# Patient Record
Sex: Female | Born: 1945 | Race: White | Hispanic: No | Smoking: Never smoker
Health system: Southern US, Community
[De-identification: ages and names within clinical notes are randomized; demographics above are authoritative.]

## PROBLEM LIST (undated history)

## (undated) DIAGNOSIS — R0600 Dyspnea, unspecified: Secondary | ICD-10-CM

## (undated) DIAGNOSIS — I10 Essential (primary) hypertension: Secondary | ICD-10-CM

## (undated) DIAGNOSIS — E785 Hyperlipidemia, unspecified: Secondary | ICD-10-CM

## (undated) DIAGNOSIS — M7989 Other specified soft tissue disorders: Secondary | ICD-10-CM

## (undated) DIAGNOSIS — G459 Transient cerebral ischemic attack, unspecified: Secondary | ICD-10-CM

## (undated) DIAGNOSIS — M1990 Unspecified osteoarthritis, unspecified site: Secondary | ICD-10-CM

## (undated) HISTORY — DX: Unspecified osteoarthritis, unspecified site: M19.90

## (undated) HISTORY — PX: TOTAL HIP ARTHROPLASTY: SHX124

## (undated) HISTORY — PX: TONSILLECTOMY: SUR1361

## (undated) HISTORY — DX: Essential (primary) hypertension: I10

## (undated) HISTORY — DX: Dyspnea, unspecified: R06.00

## (undated) HISTORY — DX: Transient cerebral ischemic attack, unspecified: G45.9

## (undated) HISTORY — DX: Hyperlipidemia, unspecified: E78.5

## (undated) HISTORY — DX: Other specified soft tissue disorders: M79.89

---

## 1999-08-09 ENCOUNTER — Encounter: Payer: Self-pay | Admitting: Otolaryngology

## 1999-08-09 ENCOUNTER — Encounter: Admission: RE | Admit: 1999-08-09 | Discharge: 1999-08-09 | Payer: Self-pay | Admitting: Otolaryngology

## 1999-11-26 ENCOUNTER — Other Ambulatory Visit: Admission: RE | Admit: 1999-11-26 | Discharge: 1999-11-26 | Payer: Self-pay | Admitting: Gynecology

## 2001-04-01 ENCOUNTER — Other Ambulatory Visit: Admission: RE | Admit: 2001-04-01 | Discharge: 2001-04-01 | Payer: Self-pay | Admitting: Gynecology

## 2002-11-28 ENCOUNTER — Other Ambulatory Visit: Admission: RE | Admit: 2002-11-28 | Discharge: 2002-11-28 | Payer: Self-pay | Admitting: Gynecology

## 2002-12-12 ENCOUNTER — Ambulatory Visit (HOSPITAL_COMMUNITY): Admission: RE | Admit: 2002-12-12 | Discharge: 2002-12-12 | Payer: Self-pay | Admitting: Gynecology

## 2002-12-12 ENCOUNTER — Encounter (INDEPENDENT_AMBULATORY_CARE_PROVIDER_SITE_OTHER): Payer: Self-pay | Admitting: *Deleted

## 2004-10-03 ENCOUNTER — Other Ambulatory Visit: Admission: RE | Admit: 2004-10-03 | Discharge: 2004-10-03 | Payer: Self-pay | Admitting: Gynecology

## 2006-12-15 ENCOUNTER — Emergency Department (HOSPITAL_COMMUNITY): Admission: EM | Admit: 2006-12-15 | Discharge: 2006-12-15 | Payer: Self-pay | Admitting: *Deleted

## 2008-02-10 ENCOUNTER — Encounter: Admission: RE | Admit: 2008-02-10 | Discharge: 2008-02-10 | Payer: Self-pay | Admitting: Rheumatology

## 2008-02-22 ENCOUNTER — Encounter: Admission: RE | Admit: 2008-02-22 | Discharge: 2008-02-22 | Payer: Self-pay | Admitting: Gynecology

## 2008-02-24 ENCOUNTER — Encounter: Admission: RE | Admit: 2008-02-24 | Discharge: 2008-02-24 | Payer: Self-pay | Admitting: Rheumatology

## 2008-03-01 ENCOUNTER — Encounter: Admission: RE | Admit: 2008-03-01 | Discharge: 2008-03-01 | Payer: Self-pay | Admitting: Rheumatology

## 2008-09-04 ENCOUNTER — Ambulatory Visit: Payer: Self-pay | Admitting: Internal Medicine

## 2008-09-11 ENCOUNTER — Encounter: Payer: Self-pay | Admitting: Internal Medicine

## 2008-11-23 ENCOUNTER — Ambulatory Visit (HOSPITAL_COMMUNITY): Admission: RE | Admit: 2008-11-23 | Discharge: 2008-11-23 | Payer: Self-pay | Admitting: Rheumatology

## 2010-03-10 ENCOUNTER — Encounter: Payer: Self-pay | Admitting: Gynecology

## 2010-07-05 NOTE — Op Note (Signed)
   NAME:  Savannah [name], PLATTE                             ACCOUNT NO.:  [id number]   MEDICAL RECORD NO.:  [id number]                   PATIENT TYPE:  AMB   LOCATION:  SDC                                  FACILITY:  [facility]   PHYSICIAN:  Luvenia Redden, M.D.                DATE OF BIRTH:  February 09, 1946   DATE OF PROCEDURE:  12/12/2002  DATE OF DISCHARGE:                                 OPERATIVE REPORT   PREOPERATIVE DIAGNOSIS:  Postmenopausal bleeding.   POSTOPERATIVE DIAGNOSIS:  Postmenopausal bleeding.   OPERATION:  Hysteroscopy and D&C.   SURGEON:  Luvenia Redden, M.D.   DESCRIPTION OF PROCEDURE:  Under good anesthesia, the patient was prepped  and draped in sterile manner.  Bladder was catheterized.  The uterus was  upper limits of normal size and retroflexed slightly.  Cervix was grasped  with tenaculum, sounded to a depth of 4 inches.  The hysteroscope was placed  into the endocervical canal using Sorbitol as a distending medium.  Endocervical canal was viewed and appeared to be normal, entering the  endometrial cavity.  There were several small polypoid structures seen.  There was a whitish structure on the anterior wall of the uterus that  appeared to be a submucous fibroid.  Dara Lords were viewed and appeared normal.  The scope was retracted and endocervical curettage was done and this was  sent as a separate specimen.  Endometrial cavity was then curetted  thoroughly and a small amount of tissue was obtained.  The endometrial  cavity was wiped with a dry sponge and then reinspected.  The endometrial  cavity's wall was kind of shaggy like it looks after a curettage but there  were no masses seen.  The remaining appeared clean.  The procedure was  terminated.  Fluid deficit was 30 mL.  The patient tolerated the procedure  well. She was removed to recovery in good condition.  Blood loss was 25 mL.                                               Luvenia Redden, M.D.    WSB/MEDQ  D:   12/12/2002  T:  12/12/2002  Job:  [id number]

## 2010-11-27 LAB — DIFFERENTIAL
Eosinophils Absolute: 0.1
Eosinophils Relative: 2
Lymphs Abs: 2
Monocytes Relative: 6

## 2010-11-27 LAB — I-STAT 8, (EC8 V) (CONVERTED LAB)
Acid-Base Excess: 2
Chloride: 106
Glucose, Bld: 92
Hemoglobin: 13.9
Potassium: 3.9
Sodium: 139
pH, Ven: 7.394 — ABNORMAL HIGH

## 2010-11-27 LAB — CBC
HCT: 39.8
Hemoglobin: 13.8
MCV: 90.3
RBC: 4.4
WBC: 6

## 2011-02-19 ENCOUNTER — Ambulatory Visit (INDEPENDENT_AMBULATORY_CARE_PROVIDER_SITE_OTHER): Payer: Medicare Other

## 2011-02-19 DIAGNOSIS — R05 Cough: Secondary | ICD-10-CM | POA: Diagnosis not present

## 2011-02-19 DIAGNOSIS — R059 Cough, unspecified: Secondary | ICD-10-CM

## 2011-02-19 DIAGNOSIS — R509 Fever, unspecified: Secondary | ICD-10-CM | POA: Diagnosis not present

## 2011-02-19 DIAGNOSIS — J111 Influenza due to unidentified influenza virus with other respiratory manifestations: Secondary | ICD-10-CM | POA: Diagnosis not present

## 2011-02-19 DIAGNOSIS — M349 Systemic sclerosis, unspecified: Secondary | ICD-10-CM

## 2011-07-10 DIAGNOSIS — M76899 Other specified enthesopathies of unspecified lower limb, excluding foot: Secondary | ICD-10-CM | POA: Diagnosis not present

## 2011-07-10 DIAGNOSIS — M359 Systemic involvement of connective tissue, unspecified: Secondary | ICD-10-CM | POA: Diagnosis not present

## 2011-07-10 DIAGNOSIS — M949 Disorder of cartilage, unspecified: Secondary | ICD-10-CM | POA: Diagnosis not present

## 2011-07-10 DIAGNOSIS — M899 Disorder of bone, unspecified: Secondary | ICD-10-CM | POA: Diagnosis not present

## 2011-07-10 DIAGNOSIS — E559 Vitamin D deficiency, unspecified: Secondary | ICD-10-CM | POA: Diagnosis not present

## 2011-07-29 DIAGNOSIS — E782 Mixed hyperlipidemia: Secondary | ICD-10-CM | POA: Diagnosis not present

## 2011-07-29 DIAGNOSIS — I10 Essential (primary) hypertension: Secondary | ICD-10-CM | POA: Diagnosis not present

## 2011-08-12 DIAGNOSIS — E782 Mixed hyperlipidemia: Secondary | ICD-10-CM | POA: Diagnosis not present

## 2011-08-19 DIAGNOSIS — M7989 Other specified soft tissue disorders: Secondary | ICD-10-CM | POA: Diagnosis not present

## 2011-09-29 DIAGNOSIS — Z79899 Other long term (current) drug therapy: Secondary | ICD-10-CM | POA: Diagnosis not present

## 2011-09-29 DIAGNOSIS — E782 Mixed hyperlipidemia: Secondary | ICD-10-CM | POA: Diagnosis not present

## 2011-10-07 ENCOUNTER — Encounter: Payer: Self-pay | Admitting: Physician Assistant

## 2011-10-07 DIAGNOSIS — E785 Hyperlipidemia, unspecified: Secondary | ICD-10-CM | POA: Insufficient documentation

## 2011-10-07 DIAGNOSIS — I10 Essential (primary) hypertension: Secondary | ICD-10-CM | POA: Insufficient documentation

## 2011-10-07 DIAGNOSIS — M349 Systemic sclerosis, unspecified: Secondary | ICD-10-CM | POA: Insufficient documentation

## 2011-10-07 DIAGNOSIS — I8390 Asymptomatic varicose veins of unspecified lower extremity: Secondary | ICD-10-CM

## 2011-10-29 ENCOUNTER — Encounter: Payer: Self-pay | Admitting: Family Medicine

## 2011-11-05 DIAGNOSIS — I10 Essential (primary) hypertension: Secondary | ICD-10-CM | POA: Diagnosis not present

## 2011-11-05 DIAGNOSIS — E782 Mixed hyperlipidemia: Secondary | ICD-10-CM | POA: Diagnosis not present

## 2011-11-12 DIAGNOSIS — M25559 Pain in unspecified hip: Secondary | ICD-10-CM | POA: Diagnosis not present

## 2012-04-30 DIAGNOSIS — Z6829 Body mass index (BMI) 29.0-29.9, adult: Secondary | ICD-10-CM | POA: Diagnosis not present

## 2012-04-30 DIAGNOSIS — H6120 Impacted cerumen, unspecified ear: Secondary | ICD-10-CM | POA: Diagnosis not present

## 2012-04-30 DIAGNOSIS — J329 Chronic sinusitis, unspecified: Secondary | ICD-10-CM | POA: Diagnosis not present

## 2012-05-06 DIAGNOSIS — H9190 Unspecified hearing loss, unspecified ear: Secondary | ICD-10-CM | POA: Diagnosis not present

## 2012-05-06 DIAGNOSIS — H6120 Impacted cerumen, unspecified ear: Secondary | ICD-10-CM | POA: Diagnosis not present

## 2012-05-06 DIAGNOSIS — Z6829 Body mass index (BMI) 29.0-29.9, adult: Secondary | ICD-10-CM | POA: Diagnosis not present

## 2012-06-24 DIAGNOSIS — M2550 Pain in unspecified joint: Secondary | ICD-10-CM | POA: Diagnosis not present

## 2012-06-24 DIAGNOSIS — Z79899 Other long term (current) drug therapy: Secondary | ICD-10-CM | POA: Diagnosis not present

## 2012-06-25 DIAGNOSIS — M2550 Pain in unspecified joint: Secondary | ICD-10-CM | POA: Diagnosis not present

## 2012-07-01 DIAGNOSIS — E559 Vitamin D deficiency, unspecified: Secondary | ICD-10-CM | POA: Diagnosis not present

## 2012-07-01 DIAGNOSIS — M2550 Pain in unspecified joint: Secondary | ICD-10-CM | POA: Diagnosis not present

## 2012-07-16 ENCOUNTER — Encounter: Payer: Self-pay | Admitting: Internal Medicine

## 2012-07-16 ENCOUNTER — Ambulatory Visit (INDEPENDENT_AMBULATORY_CARE_PROVIDER_SITE_OTHER): Payer: Medicare Other | Admitting: Internal Medicine

## 2012-07-16 VITALS — BP 122/86 | HR 76 | Ht 67.0 in | Wt 189.0 lb

## 2012-07-16 DIAGNOSIS — Z79899 Other long term (current) drug therapy: Secondary | ICD-10-CM

## 2012-07-16 DIAGNOSIS — I8390 Asymptomatic varicose veins of unspecified lower extremity: Secondary | ICD-10-CM | POA: Diagnosis not present

## 2012-07-16 DIAGNOSIS — E782 Mixed hyperlipidemia: Secondary | ICD-10-CM | POA: Diagnosis not present

## 2012-07-16 DIAGNOSIS — I10 Essential (primary) hypertension: Secondary | ICD-10-CM

## 2012-07-16 DIAGNOSIS — E785 Hyperlipidemia, unspecified: Secondary | ICD-10-CM

## 2012-07-16 DIAGNOSIS — M349 Systemic sclerosis, unspecified: Secondary | ICD-10-CM | POA: Diagnosis not present

## 2012-07-16 NOTE — Patient Instructions (Addendum)
Your physician recommends that you schedule a follow-up appointment in: 1 year Your physician recommends that you return for lab work in: CMP and LIPIDS

## 2012-07-16 NOTE — Progress Notes (Signed)
OFFICE NOTE  Chief Complaint:  Routine followup  Primary Care Physician: Dois Davenport., MD  HPI:  Savannah [name]  is a 67 year old female with a history of scleroderma, hypertension, dyslipidemia, as well as lower extremity varicose veins. At her last visit, she underwent Doppler ultrasounds, which demonstrated both deep and superficial venous reflux. There was deep valvular insufficiency in the right femoral and popliteal veins as well as the left common femoral vein. The right and left greater saphenous veins also were dilated and demonstrated valvular insufficiency. The right short saphenous vein was also noted to be a small vessel with continuous reflux. The right short saphenous vein measured less than 3.3 cm; therefore, would be difficult to treat percutaneously. The left greater saphenous vein measured between 5.4 and 9.7 mm. The right measured between 2.4 and 6.4 cm. I discussed the findings with the patient today and feel that, although feasibly could close her greater saphenous veins, with her deep vein reflux, she may not have marked improvement in her symptoms. Now actually, her symptoms actually have improved somewhat with discontinuing Lipitor, which I had started recently for dyslipidemia. As you may recall, her LDL cholesterol was high at 139 with particle number of 1941. She started Lipitor, which brought her LDL down to 62 and a particle number of 1164. I was pleased with the result; however, she thinks that she has now developed muscle pains secondary to the Lipitor. Previously, she had been intolerant to Zocor and Zetia. Therefore, recommend that she try to reduce her Lipitor to 20 mg to see if that is more tolerable for her. Certainly we have room if we can keep her LDL cholesterol less than 295.  Recently she started taking her Lipitor every other day, again due to muscle pain. Otherwise she denies any new symptoms.  PMHx:  Past Medical History  Diagnosis Date  . Hypertension       2D ECHO, 12/24/2006 - EF 45-55%, normal  . Hyperlipidemia   . Swelling of extremity     LEA VENOUS DUPLEX, 08/19/2011 - deep valvular insufficiency noted with right femoral and popliteal veins and left common femoral vein  . TIA (transient ischemic attack)     CAROTID DOPPLER, 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  . Dyspnea     MYOVIEW, 12/29/2006 - post-stress EF 81%,no ECG changes, EKG negative for ischemia    History reviewed. No pertinent past surgical history.  FAMHx:  Family History  Problem Relation Age of Onset  . Atrial fibrillation Mother 70  . Hypertension Mother   . Hyperlipidemia Father   . Cirrhosis Father 76  . Kidney failure Father 44  . Stroke Maternal Grandmother 41  . Hypertension Maternal Grandmother 40  . Angina Maternal Grandfather 50  . Heart attack Maternal Grandfather 88  . Lung disease Paternal Grandmother     SOCHx:   reports that she has never smoked. She does not have any smokeless tobacco history on file. Her alcohol and drug histories are not on file.  ALLERGIES:  Allergies  Allergen Reactions  . Codeine Nausea Only  . Sulfa Antibiotics Hives  . Zetia (Ezetimibe) Other (See Comments)    Myalgia  . Zocor (Simvastatin) Other (See Comments)    Myalgia    ROS: A comprehensive review of systems was negative except for: Constitutional: positive for fatigue Musculoskeletal: positive for myalgias  HOME MEDS: Current Outpatient Prescriptions  Medication Sig Dispense Refill  . atorvastatin (LIPITOR) 20 MG tablet Take 20  mg by mouth daily.      . calcium gluconate 500 MG tablet Take 500 mg by mouth daily.      . Cholecalciferol (VITAMIN D3) 50000 UNITS CAPS Take 1 capsule by mouth once a week.      . Coenzyme Q10 (CO Q 10) 100 MG CAPS Take 1 capsule by mouth daily.      . diclofenac sodium (VOLTAREN) 1 % GEL Apply 2 g topically as needed.      . DiphenhydrAMINE HCl (BENADRYL ALLERGY PO) Take by mouth as needed.       . folic acid (FOLVITE) 1 MG tablet Take 2 mg by mouth daily.      Marland Kitchen lisinopril (PRINIVIL,ZESTRIL) 5 MG tablet Take 5 mg by mouth daily.      . Magnesium 500 MG CAPS Take 1 capsule by mouth daily.      . Manganese 50 MG TABS Take 1 tablet by mouth daily.      . Menaquinone-7 (VITAMIN K2) 100 MCG CAPS Take 1 capsule by mouth daily.      . Misc Natural Products (TART CHERRY ADVANCED PO) Take 1,000 mg by mouth daily.      Marland Kitchen omega-3 acid ethyl esters (LOVAZA) 1 G capsule Take 1 g by mouth every other day.      . predniSONE (DELTASONE) 5 MG tablet Take 5 mg by mouth as needed.      . thiamine (VITAMIN B-1) 100 MG tablet Take 100 mg by mouth daily.      . vitamin E 400 UNIT capsule Take 400 Units by mouth daily.      Marland Kitchen aspirin 81 MG tablet Take 81 mg by mouth.       No current facility-administered medications for this visit.    LABS/IMAGING: No results found for this or any previous visit (from the past 48 hour(s)). No results found.  VITALS: BP 122/86  Pulse 76  Ht 5\' 7"  (1.702 m)  Wt 189 lb (85.73 kg)  BMI 29.59 kg/m2  EXAM: General appearance: alert and no distress Neck: no adenopathy, no carotid bruit, no JVD, supple, symmetrical, trachea midline and thyroid not enlarged, symmetric, no tenderness/mass/nodules Lungs: clear to auscultation bilaterally Heart: regular rate and rhythm, S1, S2 normal, no murmur, click, rub or gallop Abdomen: soft, non-tender; bowel sounds normal; no masses,  no organomegaly Extremities: extremities normal, atraumatic, no cyanosis or edema Pulses: 2+ and symmetric Skin: Skin color, texture, turgor normal. No rashes or lesions Neurologic: Grossly normal  EKG: Normal sinus rhythm at 76  ASSESSMENT: 1. Scleroderma 2. Hypertension 3. Dyslipidemia 4. Varicose veins  PLAN: 1.   Savannah [name] is doing very well. She unfortunately has intolerance to Lipitor and taking it every other day in alternation with the Niaspan.  Her lipid profile was at goal  however a concern it may not be at goal anymore. We'll plan to go ahead and recheck a lipid profile. She does have upcoming followup with Dr. Erlene Senters - which is a good idea, since she's been having a dry cough which could be related to scleroderma. She thinks that she needs a higher dose of steroids.  We will plan followup in one year or sooner as necessary.  Chrystie Nose, MD, [facility] Attending Cardiologist [facility]  HILTY,Kenneth C 07/16/2012, 5:24 PM

## 2012-07-20 ENCOUNTER — Encounter: Payer: Self-pay | Admitting: Internal Medicine

## 2012-07-28 DIAGNOSIS — R5383 Other fatigue: Secondary | ICD-10-CM | POA: Diagnosis not present

## 2012-07-28 DIAGNOSIS — M19049 Primary osteoarthritis, unspecified hand: Secondary | ICD-10-CM | POA: Diagnosis not present

## 2012-07-28 DIAGNOSIS — G609 Hereditary and idiopathic neuropathy, unspecified: Secondary | ICD-10-CM | POA: Diagnosis not present

## 2012-07-28 DIAGNOSIS — R5381 Other malaise: Secondary | ICD-10-CM | POA: Diagnosis not present

## 2012-07-28 DIAGNOSIS — M349 Systemic sclerosis, unspecified: Secondary | ICD-10-CM | POA: Diagnosis not present

## 2012-07-29 DIAGNOSIS — E782 Mixed hyperlipidemia: Secondary | ICD-10-CM | POA: Diagnosis not present

## 2012-07-29 DIAGNOSIS — Z79899 Other long term (current) drug therapy: Secondary | ICD-10-CM | POA: Diagnosis not present

## 2012-07-29 LAB — COMPREHENSIVE METABOLIC PANEL
ALT: 26 U/L (ref 0–35)
AST: 20 U/L (ref 0–37)
CO2: 26 mEq/L (ref 19–32)
Chloride: 107 mEq/L (ref 96–112)
Sodium: 143 mEq/L (ref 135–145)
Total Bilirubin: 0.9 mg/dL (ref 0.3–1.2)
Total Protein: 6.7 g/dL (ref 6.0–8.3)

## 2012-07-29 LAB — LIPID PANEL
Cholesterol: 234 mg/dL — ABNORMAL HIGH (ref 0–200)
LDL Cholesterol: 134 mg/dL — ABNORMAL HIGH (ref 0–99)
VLDL: 54 mg/dL — ABNORMAL HIGH (ref 0–40)

## 2012-09-15 ENCOUNTER — Other Ambulatory Visit: Payer: Self-pay | Admitting: *Deleted

## 2012-09-15 MED ORDER — OMEGA-3-ACID ETHYL ESTERS 1 G PO CAPS: 1.0000 g | ORAL_CAPSULE | ORAL | Status: DC

## 2012-09-15 NOTE — Telephone Encounter (Signed)
Rx was sent to pharmacy electronically. 

## 2012-09-22 ENCOUNTER — Other Ambulatory Visit: Payer: Self-pay

## 2012-09-30 DIAGNOSIS — R5381 Other malaise: Secondary | ICD-10-CM | POA: Diagnosis not present

## 2012-09-30 DIAGNOSIS — R5383 Other fatigue: Secondary | ICD-10-CM | POA: Diagnosis not present

## 2012-09-30 DIAGNOSIS — M349 Systemic sclerosis, unspecified: Secondary | ICD-10-CM | POA: Diagnosis not present

## 2012-09-30 DIAGNOSIS — M533 Sacrococcygeal disorders, not elsewhere classified: Secondary | ICD-10-CM | POA: Diagnosis not present

## 2012-09-30 DIAGNOSIS — M25569 Pain in unspecified knee: Secondary | ICD-10-CM | POA: Diagnosis not present

## 2012-10-04 DIAGNOSIS — M25559 Pain in unspecified hip: Secondary | ICD-10-CM | POA: Diagnosis not present

## 2012-11-17 ENCOUNTER — Other Ambulatory Visit: Payer: Self-pay | Admitting: *Deleted

## 2012-11-17 MED ORDER — ATORVASTATIN CALCIUM 20 MG PO TABS: 20.0000 mg | ORAL_TABLET | Freq: Every day | ORAL | Status: DC

## 2012-11-25 DIAGNOSIS — R5381 Other malaise: Secondary | ICD-10-CM | POA: Diagnosis not present

## 2012-11-25 DIAGNOSIS — R300 Dysuria: Secondary | ICD-10-CM | POA: Diagnosis not present

## 2012-11-25 DIAGNOSIS — Z79899 Other long term (current) drug therapy: Secondary | ICD-10-CM | POA: Diagnosis not present

## 2012-12-03 DIAGNOSIS — M25569 Pain in unspecified knee: Secondary | ICD-10-CM | POA: Diagnosis not present

## 2012-12-03 DIAGNOSIS — M1710 Unilateral primary osteoarthritis, unspecified knee: Secondary | ICD-10-CM | POA: Diagnosis not present

## 2012-12-03 DIAGNOSIS — I7300 Raynaud's syndrome without gangrene: Secondary | ICD-10-CM | POA: Diagnosis not present

## 2012-12-03 DIAGNOSIS — M349 Systemic sclerosis, unspecified: Secondary | ICD-10-CM | POA: Diagnosis not present

## 2012-12-20 ENCOUNTER — Telehealth: Payer: Self-pay | Admitting: *Deleted

## 2012-12-20 NOTE — Telephone Encounter (Signed)
Phone message was received for a "[employer]" and it was the wrong pt chart was sent.  Request for refill on lisinopril.  Reviewed files and pt has been seen in our clinic.  Returned call.  Left message to call back tomorrow before 4pm.  Will verify if this is the correct pt.  If so,

## 2012-12-21 MED ORDER — LISINOPRIL 5 MG PO TABS: 5.0000 mg | ORAL_TABLET | Freq: Two times a day (BID) | ORAL | Status: DC

## 2012-12-21 NOTE — Telephone Encounter (Signed)
Pt called back and verified x 2.  Pt stated she is leaving to go to [place] for several months and needs refills on lisinopril.  Stated her bottle she has now only has one refill, which she is about to get.  Stated she will not be back until May.  Pt informed refill will be sent to pharmacy and of refill process.  Pt verbalized understanding and agreed w/ plan.  Refill(s) sent to pharmacy.

## 2012-12-23 ENCOUNTER — Other Ambulatory Visit: Payer: Self-pay

## 2013-01-17 ENCOUNTER — Telehealth: Payer: Self-pay

## 2013-01-17 NOTE — Telephone Encounter (Addendum)
Patient should come in if she develops any symptoms called to advise, we can not send meds without visit. Advised her 48 hours of onset for meds to be effective.

## 2013-01-17 NOTE — Telephone Encounter (Signed)
PT WAS SEEN BY DR Perrin Maltese IN 2013 AND WOULD LIKE A CALL BACK FROM HIM ABOUT ADVISE. STATES SHE WAS AROUND SOMEONE WITH THE INFLUENZA B AND WANTED TO SPEAK WITH HIM ABOUT IT PLEASE CALL [phone]

## 2013-01-18 ENCOUNTER — Ambulatory Visit (INDEPENDENT_AMBULATORY_CARE_PROVIDER_SITE_OTHER): Payer: Medicare Other | Admitting: Internal Medicine

## 2013-01-18 VITALS — BP 146/74 | HR 82 | Temp 98.4°F | Resp 16 | Ht 67.0 in | Wt 191.0 lb

## 2013-01-18 DIAGNOSIS — J111 Influenza due to unidentified influenza virus with other respiratory manifestations: Secondary | ICD-10-CM | POA: Diagnosis not present

## 2013-01-18 MED ORDER — OSELTAMIVIR PHOSPHATE 75 MG PO CAPS: 75.0000 mg | ORAL_CAPSULE | Freq: Every day | ORAL | Status: DC

## 2013-01-18 MED ORDER — OSELTAMIVIR PHOSPHATE 75 MG PO CAPS: 75.0000 mg | ORAL_CAPSULE | Freq: Two times a day (BID) | ORAL | Status: DC

## 2013-01-18 NOTE — Progress Notes (Signed)
Subjective:  This chart was scribed for Savannah Sia, MD by Quintella Reichert, ED scribe.  This patient was seen in room [facility] Room 14 and the patient's care was started at 11:32 AM.    Patient ID: Savannah [name], female    DOB: 11/09/1945, 67 y.o.   MRN: [id number]   HPI  HPI Comments: Savannah [name] is a 67 y.o. female who presents complaining of mild-to-moderate flu-like symptoms that began this morning.  Pt states that her son-in-law has influenza B and she stayed at his house on Thanksgiving.  She was feeling well until she awoke this morning with a sore throat and generalized body aches.  She also states she feels "like I'm in a fog."  She denies cough.  No known fever.  She did not get the flu shot this year because she has had an adverse reaction in the past.  She had a pneumonia shot 3 years ago.  Pt has taken Tamiflu before without adverse effect.   Patient Active Problem List   Diagnosis Date Noted  . HTN (hypertension) 10/07/2011  . Dyslipidemia 10/07/2011  . Scleroderma 10/07/2011  . Varicose veins 10/07/2011    Past Medical History  Diagnosis Date  . Hypertension     2D ECHO, 12/24/2006 - EF 45-55%, normal  . Hyperlipidemia   . Swelling of extremity     LEA VENOUS DUPLEX, 08/19/2011 - deep valvular insufficiency noted with right femoral and popliteal veins and left common femoral vein  . TIA (transient ischemic attack)     CAROTID DOPPLER, 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  . Dyspnea     MYOVIEW, 12/29/2006 - post-stress EF 81%,no ECG changes, EKG negative for ischemia    No past surgical history on file.   Allergies  Allergen Reactions  . Codeine Nausea Only  . Cymbalta [Duloxetine Hcl] Hives  . Lyrica [Pregabalin] Hives  . Sulfa Antibiotics Hives  . Zetia [Ezetimibe] Other (See Comments)    Myalgia  . Zocor [Simvastatin] Other (See Comments)    Myalgia    Prior to Admission medications   Medication Sig Start Date End Date  Taking? Authorizing Provider  aspirin 81 MG tablet Take 81 mg by mouth.   Yes Historical Provider, MD  atorvastatin (LIPITOR) 20 MG tablet Take 1 tablet (20 mg total) by mouth daily. 11/17/12  Yes Chrystie Nose, MD  calcium gluconate 500 MG tablet Take 500 mg by mouth daily.   Yes Historical Provider, MD  Cholecalciferol (VITAMIN D3) 50000 UNITS CAPS Take 1 capsule by mouth once.    Yes Historical Provider, MD  Coenzyme Q10 (CO Q 10) 100 MG CAPS Take 1 capsule by mouth daily.   Yes Historical Provider, MD  diclofenac sodium (VOLTAREN) 1 % GEL Apply 2 g topically as needed.   Yes Historical Provider, MD  DiphenhydrAMINE HCl (BENADRYL ALLERGY PO) Take by mouth as needed.   Yes Historical Provider, MD  folic acid (FOLVITE) 1 MG tablet Take 2 mg by mouth daily.   Yes Historical Provider, MD  lisinopril (PRINIVIL,ZESTRIL) 5 MG tablet Take 1 tablet (5 mg total) by mouth 2 (two) times daily. 12/21/12  Yes Chrystie Nose, MD  Magnesium 500 MG CAPS Take 1 capsule by mouth daily.   Yes Historical Provider, MD  Manganese 50 MG TABS Take 1 tablet by mouth daily.   Yes Historical Provider, MD  Menaquinone-7 (VITAMIN K2) 100 MCG CAPS Take 1 capsule by mouth daily.  Yes Historical Provider, MD  Misc Natural Products (TART CHERRY ADVANCED PO) Take 1,000 mg by mouth daily.   Yes Historical Provider, MD  omega-3 acid ethyl esters (LOVAZA) 1 G capsule Take 1 capsule (1 g total) by mouth every other day. 09/15/12  Yes Chrystie Nose, MD  thiamine (VITAMIN B-1) 100 MG tablet Take 100 mg by mouth daily.   Yes Historical Provider, MD  vitamin E 400 UNIT capsule Take 400 Units by mouth daily.   Yes Historical Provider, MD  predniSONE (DELTASONE) 5 MG tablet Take 5 mg by mouth as needed. 06/25/12   Historical Provider, MD    History   Social History  . Marital Status: Married    Spouse Name: N/A    Number of Children: N/A  . Years of Education: N/A   Occupational History  . Not on file.   Social History  Main Topics  . Smoking status: Never Smoker   . Smokeless tobacco: Not on file  . Alcohol Use: Not on file  . Drug Use: Not on file  . Sexual Activity: Not on file   Other Topics Concern  . Not on file   Social History Narrative  . No narrative on file     Review of Systems  Constitutional: Positive for fatigue. Negative for fever.  HENT: Positive for sore throat.   Respiratory: Negative for cough.   Musculoskeletal: Positive for myalgias.        Objective:   Physical Exam  Nursing note and vitals reviewed. Constitutional: She is oriented to person, place, and time. She appears well-developed and well-nourished. No distress.  HENT:  Head: Normocephalic and atraumatic.  Right Ear: External ear normal.  Left Ear: External ear normal.  Nose: Nose normal.  Mouth/Throat: Posterior oropharyngeal erythema (minimal) present. No oropharyngeal exudate.  Eyes: Conjunctivae and EOM are normal. Pupils are equal, round, and reactive to light.  Cardiovascular: Normal rate, regular rhythm and normal heart sounds.   No murmur heard. Pulmonary/Chest: Effort normal and breath sounds normal. No respiratory distress. She has no wheezes. She has no rales.  Lymphadenopathy:    She has no cervical adenopathy.  Neurological: She is alert and oriented to person, place, and time.  Skin: Skin is warm and dry.  Psychiatric: She has a normal mood and affect. Her behavior is normal.         Assessment & Plan:  Influenza Pt states her husband Tamiko Leopard) also stayed at [employer].  Treat her /prophyllax him Meds ordered this encounter  Medications  . oseltamivir (TAMIFLU) 75 MG capsule    Sig: Take 1 capsule (75 mg total) by mouth 2 (two) times daily.    Dispense:  10 capsule    Refill:  0  . oseltamivir (TAMIFLU) 75 MG capsule    Sig: Take 1 capsule (75 mg total) by mouth daily. For husband Ian Bushman    Dispense:  10 capsule    Refill:  0     I have completed the patient  encounter in its entirety as documented by the scribe, with editing by me where necessary. Mavery Milling P. Merla Riches, M.D.

## 2013-06-22 DIAGNOSIS — M1710 Unilateral primary osteoarthritis, unspecified knee: Secondary | ICD-10-CM | POA: Diagnosis not present

## 2013-06-27 ENCOUNTER — Other Ambulatory Visit: Payer: Self-pay | Admitting: Dermatology

## 2013-06-27 DIAGNOSIS — D239 Other benign neoplasm of skin, unspecified: Secondary | ICD-10-CM | POA: Diagnosis not present

## 2013-06-27 DIAGNOSIS — D485 Neoplasm of uncertain behavior of skin: Secondary | ICD-10-CM | POA: Diagnosis not present

## 2013-06-27 DIAGNOSIS — B360 Pityriasis versicolor: Secondary | ICD-10-CM | POA: Diagnosis not present

## 2013-06-27 DIAGNOSIS — D235 Other benign neoplasm of skin of trunk: Secondary | ICD-10-CM | POA: Diagnosis not present

## 2013-06-27 DIAGNOSIS — L821 Other seborrheic keratosis: Secondary | ICD-10-CM | POA: Diagnosis not present

## 2013-06-27 DIAGNOSIS — L820 Inflamed seborrheic keratosis: Secondary | ICD-10-CM | POA: Diagnosis not present

## 2013-07-01 DIAGNOSIS — M1710 Unilateral primary osteoarthritis, unspecified knee: Secondary | ICD-10-CM | POA: Diagnosis not present

## 2013-07-18 ENCOUNTER — Other Ambulatory Visit: Payer: Self-pay | Admitting: *Deleted

## 2013-07-18 MED ORDER — LISINOPRIL 5 MG PO TABS: 5.0000 mg | ORAL_TABLET | Freq: Two times a day (BID) | ORAL | Status: DC

## 2013-07-18 NOTE — Telephone Encounter (Signed)
Rx was sent to pharmacy electronically. 

## 2013-07-22 DIAGNOSIS — M1710 Unilateral primary osteoarthritis, unspecified knee: Secondary | ICD-10-CM | POA: Diagnosis not present

## 2013-08-03 DIAGNOSIS — M1710 Unilateral primary osteoarthritis, unspecified knee: Secondary | ICD-10-CM | POA: Diagnosis not present

## 2013-08-17 DIAGNOSIS — M1710 Unilateral primary osteoarthritis, unspecified knee: Secondary | ICD-10-CM | POA: Diagnosis not present

## 2013-08-18 ENCOUNTER — Encounter: Payer: Self-pay | Admitting: Internal Medicine

## 2013-08-18 ENCOUNTER — Ambulatory Visit (INDEPENDENT_AMBULATORY_CARE_PROVIDER_SITE_OTHER): Payer: Medicare Other | Admitting: Internal Medicine

## 2013-08-18 VITALS — BP 122/68 | HR 86 | Ht 67.0 in | Wt 190.2 lb

## 2013-08-18 DIAGNOSIS — I10 Essential (primary) hypertension: Secondary | ICD-10-CM | POA: Diagnosis not present

## 2013-08-18 DIAGNOSIS — E785 Hyperlipidemia, unspecified: Secondary | ICD-10-CM

## 2013-08-18 DIAGNOSIS — I8390 Asymptomatic varicose veins of unspecified lower extremity: Secondary | ICD-10-CM

## 2013-08-18 MED ORDER — PITAVASTATIN CALCIUM 2 MG PO TABS: 2.0000 mg | ORAL_TABLET | Freq: Every day | ORAL | Status: DC

## 2013-08-18 MED ORDER — LISINOPRIL 5 MG PO TABS: 5.0000 mg | ORAL_TABLET | Freq: Two times a day (BID) | ORAL | Status: DC

## 2013-08-18 NOTE — Patient Instructions (Signed)
Your physician recommends that you return for lab work in: 3 months (fasting)  Your physician wants you to follow-up in: 1 year. You will receive a reminder letter in the mail two months in advance. If you don't receive a letter, please call our office to schedule the follow-up appointment.

## 2013-08-18 NOTE — Progress Notes (Signed)
OFFICE NOTE  Chief Complaint:  Routine followup  Primary Care Physician: Hayden Rasmussen., MD  HPI:  Savannah [name]  is a 68 year old female with a history of scleroderma, hypertension, dyslipidemia, as well as lower extremity varicose veins. At her last visit, she underwent Doppler ultrasounds, which demonstrated both deep and superficial venous reflux. There was deep valvular insufficiency in the right femoral and popliteal veins as well as the left common femoral vein. The right and left greater saphenous veins also were dilated and demonstrated valvular insufficiency. The right short saphenous vein was also noted to be a small vessel with continuous reflux. The right short saphenous vein measured less than 3.3 cm; therefore, would be difficult to treat percutaneously. The left greater saphenous vein measured between 5.4 and 9.7 mm. The right measured between 2.4 and 6.4 cm. I discussed the findings with the patient today and feel that, although feasibly could close her greater saphenous veins, with her deep vein reflux, she may not have marked improvement in her symptoms. Now actually, her symptoms actually have improved somewhat with discontinuing Lipitor, which I had started recently for dyslipidemia. As you may recall, her LDL cholesterol was high at 139 with particle number of 1941. She started Lipitor, which brought her LDL down to 62 and a particle number of 1164. I was pleased with the result; however, she thinks that she has now developed muscle pains secondary to the Lipitor. Previously, she had been intolerant to Zocor and Zetia. Therefore, recommend that she try to reduce her Lipitor to 20 mg to see if that is more tolerable for her. Certainly we have room if we can keep her LDL cholesterol less than 100.    Savannah [name] follows up today and reports doing well. She was recently with her husband her cardiologist in [place] and had inquired about Livalo. Someone in her family takes  this and is doing very well on the medication. She asked the cardiologist for prescription and got the medicine filled but has not yet taken it.  PMHx:  Past Medical History  Diagnosis Date  . Hypertension     2D ECHO, 12/24/2006 - EF 45-55%, normal  . Hyperlipidemia   . Swelling of extremity     LEA VENOUS DUPLEX, 08/19/2011 - deep valvular insufficiency noted with right femoral and popliteal veins and left common femoral vein  . TIA (transient ischemic attack)     CAROTID DOPPLER, 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  . Dyspnea     MYOVIEW, 12/29/2006 - post-stress EF 81%,no ECG changes, EKG negative for ischemia    History reviewed. No pertinent past surgical history.  FAMHx:  Family History  Problem Relation Age of Onset  . Atrial fibrillation Mother 25  . Hypertension Mother   . Hyperlipidemia Father   . Cirrhosis Father 62  . Kidney failure Father 34  . Stroke Maternal Grandmother 85  . Hypertension Maternal Grandmother 40  . Angina Maternal Grandfather 53  . Heart attack Maternal Grandfather 88  . Lung disease Paternal Grandmother     SOCHx:   reports that she has never smoked. She has never used smokeless tobacco. She reports that she drinks about 3 ounces of alcohol per week. She reports that she does not use illicit drugs.  ALLERGIES:  Allergies  Allergen Reactions  . Codeine Nausea Only  . Cymbalta [Duloxetine Hcl] Hives  . Lyrica [Pregabalin] Hives  . Sulfa Antibiotics Hives  . Zetia [Ezetimibe] Other (See Comments)  Myalgia  . Zocor [Simvastatin] Other (See Comments)    Myalgia    ROS: A comprehensive review of systems was negative except for: Musculoskeletal: positive for myalgias  HOME MEDS: Current Outpatient Prescriptions  Medication Sig Dispense Refill  . aspirin 81 MG tablet Take 81 mg by mouth.      . calcium gluconate 500 MG tablet Take 500 mg by mouth daily.      . Cholecalciferol (VITAMIN D3) 50000 UNITS CAPS  Take 1 capsule by mouth every 30 (thirty) days.       . Coenzyme Q10 (CO Q 10) 100 MG CAPS Take 1 capsule by mouth daily.      . diclofenac sodium (VOLTAREN) 1 % GEL Apply 2 g topically as needed.      . DiphenhydrAMINE HCl (BENADRYL ALLERGY PO) Take by mouth as needed.      . folic acid (FOLVITE) 1 MG tablet Take 2 mg by mouth daily.      Marland Kitchen ibuprofen (ADVIL,MOTRIN) 200 MG tablet Take 200 mg by mouth every 6 (six) hours as needed.      Marland Kitchen lisinopril (PRINIVIL,ZESTRIL) 5 MG tablet Take 1 tablet (5 mg total) by mouth 2 (two) times daily.  180 tablet  3  . Magnesium 500 MG CAPS Take 1 capsule by mouth daily.      . Manganese 50 MG TABS Take 1 tablet by mouth daily.      . Menaquinone-7 (VITAMIN K2) 100 MCG CAPS Take 1 capsule by mouth daily.      . Misc Natural Products (TART CHERRY ADVANCED PO) Take 1,000 mg by mouth daily.      Marland Kitchen omega-3 acid ethyl esters (LOVAZA) 1 G capsule Take 1 capsule (1 g total) by mouth every other day.  30 capsule  3  . thiamine (VITAMIN B-1) 100 MG tablet Take 100 mg by mouth daily.      . vitamin E 400 UNIT capsule Take 400 Units by mouth daily.      . Pitavastatin Calcium 2 MG TABS Take 1 tablet (2 mg total) by mouth daily.  28 tablet  0   No current facility-administered medications for this visit.    LABS/IMAGING: No results found for this or any previous visit (from the past 48 hour(s)). No results found.  VITALS: BP 122/68  Pulse 86  Ht 5\' 7"  (1.702 m)  Wt 190 lb 3.2 oz (86.274 kg)  BMI 29.78 kg/m2  EXAM: General appearance: alert and no distress Neck: no adenopathy, no carotid bruit, no JVD, supple, symmetrical, trachea midline and thyroid not enlarged, symmetric, no tenderness/mass/nodules Lungs: clear to auscultation bilaterally Heart: regular rate and rhythm, S1, S2 normal, no murmur, click, rub or gallop Abdomen: soft, non-tender; bowel sounds normal; no masses,  no organomegaly Extremities: extremities normal, atraumatic, no cyanosis or  edema Pulses: 2+ and symmetric Skin: Skin color, texture, turgor normal. No rashes or lesions Neurologic: Grossly normal  EKG: Normal sinus rhythm at 86  ASSESSMENT: 1. Scleroderma 2. Hypertension 3. Dyslipidemia 4. Varicose veins  PLAN: 1.   Savannah [name] is doing very well. She has reported intolerance to Lipitor. She requested and got a prescription for Livalo from another physician in [place]. She is not started that medicine. I think this is a reasonable treatment alternative for her. I advised her to stop her Lipitor and to take Livalo 2 mg daily. I've also given her additional samples to equal 3 months. Unfortunately, she is on Medicare and the cost of the  prescription was about $200. There are no co-pay assistance for this medication as it is not generic. We will plan to recheck her lipid profile in 3 months. Followup can be annually.  Pixie Casino, MD, [facility] Attending Cardiologist [facility] [place] 08/18/2013, 3:25 PM

## 2013-09-22 DIAGNOSIS — M349 Systemic sclerosis, unspecified: Secondary | ICD-10-CM | POA: Diagnosis not present

## 2013-09-22 DIAGNOSIS — M25569 Pain in unspecified knee: Secondary | ICD-10-CM | POA: Diagnosis not present

## 2013-09-22 DIAGNOSIS — M19049 Primary osteoarthritis, unspecified hand: Secondary | ICD-10-CM | POA: Diagnosis not present

## 2013-09-22 DIAGNOSIS — M069 Rheumatoid arthritis, unspecified: Secondary | ICD-10-CM | POA: Diagnosis not present

## 2013-09-26 ENCOUNTER — Telehealth: Payer: Self-pay | Admitting: *Deleted

## 2013-09-26 NOTE — Telephone Encounter (Signed)
Dr. Debara Pickett called patient on 8/4 and left her message that she should be OK taking lisinopril w/ scleroderma

## 2013-10-04 DIAGNOSIS — R635 Abnormal weight gain: Secondary | ICD-10-CM | POA: Diagnosis not present

## 2013-10-04 DIAGNOSIS — K625 Hemorrhage of anus and rectum: Secondary | ICD-10-CM | POA: Diagnosis not present

## 2013-10-04 DIAGNOSIS — K5730 Diverticulosis of large intestine without perforation or abscess without bleeding: Secondary | ICD-10-CM | POA: Diagnosis not present

## 2013-10-04 DIAGNOSIS — Z1211 Encounter for screening for malignant neoplasm of colon: Secondary | ICD-10-CM | POA: Diagnosis not present

## 2013-10-04 DIAGNOSIS — K644 Residual hemorrhoidal skin tags: Secondary | ICD-10-CM | POA: Diagnosis not present

## 2013-10-04 DIAGNOSIS — Z86010 Personal history of colonic polyps: Secondary | ICD-10-CM | POA: Diagnosis not present

## 2013-10-05 ENCOUNTER — Telehealth: Payer: Self-pay | Admitting: *Deleted

## 2013-10-05 DIAGNOSIS — E785 Hyperlipidemia, unspecified: Secondary | ICD-10-CM

## 2013-10-05 MED ORDER — PITAVASTATIN CALCIUM 2 MG PO TABS: 2.0000 mg | ORAL_TABLET | Freq: Every day | ORAL | Status: DC

## 2013-10-05 NOTE — Telephone Encounter (Signed)
NEED SAMPLES OF LIVALO  1 BOX AVAILABLE

## 2013-10-06 ENCOUNTER — Telehealth: Payer: Self-pay | Admitting: *Deleted

## 2013-10-06 MED ORDER — VALSARTAN 80 MG PO TABS: 80.0000 mg | ORAL_TABLET | Freq: Every day | ORAL | Status: DC

## 2013-10-06 NOTE — Telephone Encounter (Signed)
Patient walked in with information regarding scleroderma and lisinopril, wishing to be changed to a different BP med  Dr. Debara Pickett changed her from lisinopril 5mg  BID to valsartan (diovan) 80mg  QD  Patient notified.   Rx was sent to pharmacy electronically.

## 2013-10-18 DIAGNOSIS — M25569 Pain in unspecified knee: Secondary | ICD-10-CM | POA: Diagnosis not present

## 2013-10-18 DIAGNOSIS — M19049 Primary osteoarthritis, unspecified hand: Secondary | ICD-10-CM | POA: Diagnosis not present

## 2013-10-18 DIAGNOSIS — I7300 Raynaud's syndrome without gangrene: Secondary | ICD-10-CM | POA: Diagnosis not present

## 2013-10-18 DIAGNOSIS — M349 Systemic sclerosis, unspecified: Secondary | ICD-10-CM | POA: Diagnosis not present

## 2013-11-09 DIAGNOSIS — K625 Hemorrhage of anus and rectum: Secondary | ICD-10-CM | POA: Diagnosis not present

## 2013-11-09 DIAGNOSIS — Z1211 Encounter for screening for malignant neoplasm of colon: Secondary | ICD-10-CM | POA: Diagnosis not present

## 2013-11-09 DIAGNOSIS — K5730 Diverticulosis of large intestine without perforation or abscess without bleeding: Secondary | ICD-10-CM | POA: Diagnosis not present

## 2013-11-09 DIAGNOSIS — Z86010 Personal history of colonic polyps: Secondary | ICD-10-CM | POA: Diagnosis not present

## 2013-11-10 DIAGNOSIS — M25569 Pain in unspecified knee: Secondary | ICD-10-CM | POA: Diagnosis not present

## 2013-11-10 DIAGNOSIS — M1710 Unilateral primary osteoarthritis, unspecified knee: Secondary | ICD-10-CM | POA: Diagnosis not present

## 2013-12-12 DIAGNOSIS — L821 Other seborrheic keratosis: Secondary | ICD-10-CM | POA: Diagnosis not present

## 2013-12-26 ENCOUNTER — Other Ambulatory Visit: Payer: Self-pay | Admitting: Gynecology

## 2013-12-26 DIAGNOSIS — Z124 Encounter for screening for malignant neoplasm of cervix: Secondary | ICD-10-CM | POA: Diagnosis not present

## 2013-12-27 LAB — CYTOLOGY - PAP

## 2014-04-10 ENCOUNTER — Telehealth: Payer: Self-pay | Admitting: Internal Medicine

## 2014-04-10 MED ORDER — VALSARTAN 80 MG PO TABS: 80.0000 mg | ORAL_TABLET | Freq: Every day | ORAL | Status: DC

## 2014-04-10 NOTE — Telephone Encounter (Signed)
Rx(s) sent to pharmacy electronically.  

## 2014-04-10 NOTE — Telephone Encounter (Signed)
°  1. Which medications need to be refilled? Valsartan  2. Which pharmacy is medication to be sent to?[phone]-Fax [phone] They need a 30 day or 90 day supply? 30  4. Would they like a call back once the medication has been sent to the pharmacy? no

## 2014-06-12 ENCOUNTER — Other Ambulatory Visit: Payer: Self-pay

## 2014-06-12 MED ORDER — VALSARTAN 80 MG PO TABS: 80.0000 mg | ORAL_TABLET | Freq: Every day | ORAL | Status: DC

## 2014-06-12 NOTE — Telephone Encounter (Signed)
Rx(s) sent to pharmacy electronically.  

## 2014-06-29 DIAGNOSIS — L820 Inflamed seborrheic keratosis: Secondary | ICD-10-CM | POA: Diagnosis not present

## 2014-06-29 DIAGNOSIS — D485 Neoplasm of uncertain behavior of skin: Secondary | ICD-10-CM | POA: Diagnosis not present

## 2014-06-29 DIAGNOSIS — L218 Other seborrheic dermatitis: Secondary | ICD-10-CM | POA: Diagnosis not present

## 2014-06-29 DIAGNOSIS — D225 Melanocytic nevi of trunk: Secondary | ICD-10-CM | POA: Diagnosis not present

## 2014-06-29 DIAGNOSIS — D2272 Melanocytic nevi of left lower limb, including hip: Secondary | ICD-10-CM | POA: Diagnosis not present

## 2014-06-29 DIAGNOSIS — D2271 Melanocytic nevi of right lower limb, including hip: Secondary | ICD-10-CM | POA: Diagnosis not present

## 2014-06-29 DIAGNOSIS — L821 Other seborrheic keratosis: Secondary | ICD-10-CM | POA: Diagnosis not present

## 2014-07-04 DIAGNOSIS — M349 Systemic sclerosis, unspecified: Secondary | ICD-10-CM | POA: Diagnosis not present

## 2014-07-04 DIAGNOSIS — M19041 Primary osteoarthritis, right hand: Secondary | ICD-10-CM | POA: Diagnosis not present

## 2014-07-04 DIAGNOSIS — M170 Bilateral primary osteoarthritis of knee: Secondary | ICD-10-CM | POA: Diagnosis not present

## 2014-07-04 DIAGNOSIS — M19071 Primary osteoarthritis, right ankle and foot: Secondary | ICD-10-CM | POA: Diagnosis not present

## 2014-07-20 DIAGNOSIS — H524 Presbyopia: Secondary | ICD-10-CM | POA: Diagnosis not present

## 2014-07-20 DIAGNOSIS — H2513 Age-related nuclear cataract, bilateral: Secondary | ICD-10-CM | POA: Diagnosis not present

## 2014-07-25 ENCOUNTER — Telehealth (HOSPITAL_COMMUNITY): Payer: Self-pay | Admitting: *Deleted

## 2014-07-25 DIAGNOSIS — M349 Systemic sclerosis, unspecified: Secondary | ICD-10-CM

## 2014-07-25 DIAGNOSIS — R0602 Shortness of breath: Secondary | ICD-10-CM

## 2014-07-25 NOTE — Telephone Encounter (Signed)
Pt referred to Dr Haroldine Laws by Dr Estanislado Pandy for scleroderma, per Dr Haroldine Laws pt needs pfts and echo prior to appt, orders placed will schedule

## 2014-07-26 ENCOUNTER — Telehealth (HOSPITAL_COMMUNITY): Payer: Self-pay | Admitting: Vascular Surgery

## 2014-07-26 DIAGNOSIS — Z79899 Other long term (current) drug therapy: Secondary | ICD-10-CM | POA: Diagnosis not present

## 2014-07-26 DIAGNOSIS — R300 Dysuria: Secondary | ICD-10-CM | POA: Diagnosis not present

## 2014-07-31 ENCOUNTER — Other Ambulatory Visit: Payer: Self-pay | Admitting: *Deleted

## 2014-07-31 MED ORDER — VALSARTAN 80 MG PO TABS: 80.0000 mg | ORAL_TABLET | Freq: Every day | ORAL | Status: DC

## 2014-08-03 NOTE — Telephone Encounter (Signed)
Left pt message for appt

## 2014-08-08 ENCOUNTER — Ambulatory Visit (HOSPITAL_COMMUNITY)
Admission: RE | Admit: 2014-08-08 | Discharge: 2014-08-08 | Disposition: A | Discharge status: Home / Self Care | Payer: Medicare Other | Source: Ambulatory Visit | Attending: Internal Medicine | Admitting: Internal Medicine

## 2014-08-08 DIAGNOSIS — M349 Systemic sclerosis, unspecified: Secondary | ICD-10-CM | POA: Diagnosis not present

## 2014-08-08 DIAGNOSIS — R0602 Shortness of breath: Secondary | ICD-10-CM

## 2014-08-08 LAB — PULMONARY FUNCTION TEST
DL/VA % pred: 78 %
DL/VA: 4.06 ml/min/mmHg/L
DLCO unc % pred: 66 %
DLCO unc: 18.83 ml/min/mmHg
FEF 25-75 Post: 2.49 L/sec
FEF 25-75 Pre: 1.97 L/sec
FEF2575-%Change-Post: 25 %
FEF2575-%Pred-Post: 119 %
FEF2575-%Pred-Pre: 94 %
FEV1-%Change-Post: 4 %
FEV1-%Pred-Post: 103 %
FEV1-%Pred-Pre: 99 %
FEV1-Post: 2.65 L
FEV1-Pre: 2.54 L
FEV1FVC-%Change-Post: 6 %
FEV1FVC-%Pred-Pre: 99 %
FEV6-%Change-Post: 0 %
FEV6-%Pred-Post: 101 %
FEV6-%Pred-Pre: 101 %
FEV6-Post: 3.29 L
FEV6-Pre: 3.28 L
FEV6FVC-%Change-Post: 2 %
FEV6FVC-%Pred-Post: 104 %
FEV6FVC-%Pred-Pre: 101 %
FVC-%Change-Post: -2 %
FVC-%Pred-Post: 97 %
FVC-%Pred-Pre: 99 %
FVC-Post: 3.29 L
FVC-Pre: 3.36 L
Post FEV1/FVC ratio: 80 %
Post FEV6/FVC ratio: 100 %
Pre FEV1/FVC ratio: 75 %
Pre FEV6/FVC Ratio: 98 %
RV % pred: 80 %
RV: 1.88 L
TLC % pred: 100 %
TLC: 5.55 L

## 2014-08-08 MED ORDER — ALBUTEROL SULFATE (2.5 MG/3ML) 0.083% IN NEBU
2.5000 mg | INHALATION_SOLUTION | Freq: Once | RESPIRATORY_TRACT | Status: AC
  Administered 2014-08-08: 2.5 mg via RESPIRATORY_TRACT

## 2014-08-14 ENCOUNTER — Other Ambulatory Visit: Payer: Self-pay

## 2014-08-18 ENCOUNTER — Ambulatory Visit (HOSPITAL_COMMUNITY)
Admission: RE | Admit: 2014-08-18 | Discharge: 2014-08-18 | Disposition: A | Discharge status: Home / Self Care | Payer: Medicare Other | Source: Ambulatory Visit | Attending: Family Medicine | Admitting: Family Medicine

## 2014-08-18 ENCOUNTER — Encounter (HOSPITAL_COMMUNITY): Payer: Self-pay

## 2014-08-18 ENCOUNTER — Ambulatory Visit (HOSPITAL_BASED_OUTPATIENT_CLINIC_OR_DEPARTMENT_OTHER)
Admission: RE | Admit: 2014-08-18 | Discharge: 2014-08-18 | Disposition: A | Discharge status: Home / Self Care | Payer: Medicare Other | Source: Ambulatory Visit | Attending: Internal Medicine | Admitting: Internal Medicine

## 2014-08-18 ENCOUNTER — Ambulatory Visit (HOSPITAL_COMMUNITY)
Admission: RE | Admit: 2014-08-18 | Discharge: 2014-08-18 | Disposition: A | Discharge status: Home / Self Care | Payer: Medicare Other | Source: Ambulatory Visit | Attending: Internal Medicine | Admitting: Internal Medicine

## 2014-08-18 ENCOUNTER — Other Ambulatory Visit: Payer: Self-pay

## 2014-08-18 ENCOUNTER — Other Ambulatory Visit (HOSPITAL_COMMUNITY): Payer: Self-pay | Admitting: Adult Health

## 2014-08-18 VITALS — BP 118/78 | HR 70 | Wt 191.5 lb

## 2014-08-18 DIAGNOSIS — M349 Systemic sclerosis, unspecified: Secondary | ICD-10-CM

## 2014-08-18 DIAGNOSIS — I10 Essential (primary) hypertension: Secondary | ICD-10-CM | POA: Insufficient documentation

## 2014-08-18 DIAGNOSIS — R0600 Dyspnea, unspecified: Secondary | ICD-10-CM | POA: Diagnosis not present

## 2014-08-18 DIAGNOSIS — I272 Other secondary pulmonary hypertension: Secondary | ICD-10-CM | POA: Diagnosis not present

## 2014-08-18 DIAGNOSIS — E785 Hyperlipidemia, unspecified: Secondary | ICD-10-CM | POA: Insufficient documentation

## 2014-08-18 DIAGNOSIS — R600 Localized edema: Secondary | ICD-10-CM | POA: Diagnosis not present

## 2014-08-18 DIAGNOSIS — R0602 Shortness of breath: Secondary | ICD-10-CM | POA: Diagnosis not present

## 2014-08-18 MED ORDER — POTASSIUM CHLORIDE CRYS ER 20 MEQ PO TBCR: 20.0000 meq | EXTENDED_RELEASE_TABLET | Freq: Every day | ORAL | Status: DC | PRN

## 2014-08-18 MED ORDER — FUROSEMIDE 20 MG PO TABS: 20.0000 mg | ORAL_TABLET | Freq: Every day | ORAL | Status: DC | PRN

## 2014-08-18 NOTE — Progress Notes (Signed)
Echocardiogram 2D Echocardiogram has been performed.  Tresa Res 08/18/2014, 10:47 AM

## 2014-08-18 NOTE — Addendum Note (Signed)
Encounter addended by: Effie Berkshire, RN on: 08/18/2014 12:33 PM<BR>     Documentation filed: Visit Diagnoses, Dx Association, Patient Instructions Section, Orders

## 2014-08-18 NOTE — Patient Instructions (Signed)
Chest x ray today.  Routine lab work today. Will notify you of abnormal results, otherwise no news is good news!  Take Lasix 20 mg (1 tablet) as needed for weight gain or edema (swelling).  Take Potassium 20 meq (1 tablet) as needed when you take Lasix.  Prescriptions sent to [facility].  Follow up 12 months.  Do the following things EVERYDAY: 1) Weigh yourself in the morning before breakfast. Write it down and keep it in a log. 2) Take your medicines as prescribed 3) Eat low salt foods-Limit salt (sodium) to 2000 mg per day.  4) Stay as active as you can everyday 5) Limit all fluids for the day to less than 2 liters

## 2014-08-18 NOTE — Progress Notes (Addendum)
Patient ID: Savannah [name], female   DOB: February 26, 1945, 69 y.o.   MRN: [id number]  Referring Physician: Estanislado Pandy Primary Care: Deveshwar Primary Cardiologist: Debara Pickett  HPI: Savannah [name] is a 69 y.o. female with hx of Scleroderma, TIA, HTN, Dyslipidemia, and LE varicose veins. She was referred by Dr. Estanislado Pandy for screening for [facility] in setting of scleroderma.  Says she was diagnosed with scleroderma around 2005. Noticed skin on her fingers had bx which confirmed scleroderma. Denies any known h/o of PAH. Not very active due to knee pain. Gets SOB when she walks up the driveway. + LE edema. Legs skinny in am and swell throughout the day. No orthopnea. Husband says she snores. Weight stable at 180-190 for last year or more. BP well controlled. Occasional heart pounding and rate up to 90s. No irregularity. Has never smoked.   Had dyspnea in 2008. And had a stress test and echo. Her EF was  45-55% by Echo 11/08. Myoview in 11/08 negative for ischemia with post stress test EF of 81%.   Echo 08/18/14 EF 60% Grade I DD. Normal RV. No TR.   PFTs 08/08/14 FEV1 2.54 L (99%) FVC   3.36 L (99%) DLCO  66%  ECG: NSR 75  Review of Systems: [y] = yes, [ ]  = no   General: Weight gain [ ] ; Weight loss [ ] ; Anorexia [ ] ; Fatigue [y]; Fever [ ] ; Chills [ ] ; Weakness [ ]   Cardiac: Chest pain/pressure [ ] ; Resting SOB [ ] ; Exertional SOB [ y]; Orthopnea [ ] ; Pedal Edema [ y]; Palpitations [ ] ; Syncope [ ] ; Presyncope [ ] ; Paroxysmal nocturnal dyspnea[ ]   Pulmonary: Cough [ ] ; Wheezing[ ] ; Hemoptysis[ ] ; Sputum [ ] ; Snoring Blue.Reese ]  GI: Vomiting[ ] ; Dysphagia[ ] ; Melena[ ] ; Hematochezia [ ] ; Heartburn[ ] ; Abdominal pain [ ] ; Constipation [ ] ; Diarrhea [ ] ; BRBPR [ ]   GU: Hematuria[ ] ; Dysuria [ ] ; Nocturia[ ]   Vascular: Pain in legs with walking [ ] ; Pain in feet with lying flat [ ] ; Non-healing sores [ ] ; Stroke [ ] ; TIA [ ] ; Slurred speech [ ] ;  Neuro: Headaches[ ] ; Vertigo[ ] ; Seizures[ ] ; Paresthesias[ ] ;Blurred vision [  ]; Diplopia [ ] ; Vision changes [ ]   Ortho/Skin: Arthritis [ y]; Joint pain [ ] ; Muscle pain [ ] ; Joint swelling [ ] ; Back Pain [ ] ; Rash [ ]   Psych: Depression[ ] ; Anxiety[ ]   Heme: Bleeding problems [ ] ; Clotting disorders [ ] ; Anemia [ ]   Endocrine: Diabetes [ ] ; Thyroid dysfunction[ ]    Past Medical History  Diagnosis Date  . Hypertension     2D ECHO, 12/24/2006 - EF 45-55%, normal  . Hyperlipidemia   . Swelling of extremity     LEA VENOUS DUPLEX, 08/19/2011 - deep valvular insufficiency noted with right femoral and popliteal veins and left common femoral vein  . TIA (transient ischemic attack)     CAROTID DOPPLER, 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  . Dyspnea     MYOVIEW, 12/29/2006 - post-stress EF 81%,no ECG changes, EKG negative for ischemia    Current Outpatient Prescriptions  Medication Sig Dispense Refill  . aspirin 81 MG tablet Take 81 mg by mouth.    . calcium gluconate 500 MG tablet Take 500 mg by mouth daily.    . Cholecalciferol (VITAMIN D3) 50000 UNITS CAPS Take 1 capsule by mouth every 30 (thirty) days.     . Coenzyme Q10 (CO Q 10) 100 MG CAPS  Take 1 capsule by mouth daily.    . diclofenac sodium (VOLTAREN) 1 % GEL Apply 2 g topically as needed.    . DiphenhydrAMINE HCl (BENADRYL ALLERGY PO) Take by mouth as needed.    Marland Kitchen ibuprofen (ADVIL,MOTRIN) 200 MG tablet Take 200 mg by mouth every 6 (six) hours as needed.    . Pitavastatin Calcium (LIVALO) 2 MG TABS Take by mouth.    . valsartan (DIOVAN) 80 MG tablet Take 1 tablet (80 mg total) by mouth daily. 30 tablet 1   No current facility-administered medications for this encounter.    Allergies  Allergen Reactions  . Codeine Nausea Only  . Cymbalta [Duloxetine Hcl] Hives  . Lyrica [Pregabalin] Hives  . Sulfa Antibiotics Hives  . Zetia [Ezetimibe] Other (See Comments)    Myalgia  . Zocor [Simvastatin] Other (See Comments)    Myalgia      History   Social History  . Marital  Status: Married    Spouse Name: N/A  . Number of Children: N/A  . Years of Education: N/A   Occupational History  . Not on file.   Social History Main Topics  . Smoking status: Never Smoker   . Smokeless tobacco: Never Used  . Alcohol Use: 3.0 oz/week    6 drink(s) per week  . Drug Use: No  . Sexual Activity: Not on file   Other Topics Concern  . Not on file   Social History Narrative      Family History  Problem Relation Age of Onset  . Atrial fibrillation Mother 68  . Hypertension Mother   . Hyperlipidemia Father   . Cirrhosis Father 31  . Kidney failure Father 74  . Stroke Maternal Grandmother 85  . Hypertension Maternal Grandmother 40  . Angina Maternal Grandfather 73  . Heart attack Maternal Grandfather 88  . Lung disease Paternal Grandmother     Filed Vitals:   08/18/14 1105  BP: 118/78  Pulse: 70  Weight: 191 lb 8 oz (86.864 kg)  SpO2: 99%    PHYSICAL EXAM: General:  Well appearing. No respiratory difficulty HEENT: normal Neck: supple. no JVD. Carotids 2+ bilat; no bruits. No lymphadenopathy or thryomegaly appreciated. Cor: PMI nondisplaced. Regular rate & rhythm.+s4 no murmur Lungs: clear Abdomen: soft, nontender, nondistended. No hepatosplenomegaly. No bruits or masses. Good bowel sounds. Extremities: no cyanosis, clubbing, rash, tr edema  Neuro: alert & oriented x 3, cranial nerves grossly intact. moves all 4 extremities w/o difficulty. Affect pleasant.   ASSESSMENT & PLAN:  1. Scleroderma 2. HTN  3. Dyslipidemia - Livalo currently 2 times a week.  Does not tolerated statins. 4. TIA - no deficits 5. Snoring  On PFTs her spirometry is normal. DLCO mildly reduced raising possibility of PAH. However echo completely normal except for diastolic dysfunction. RV and pulmonary pressures look good. Doubt she has PAH related to scleroderma, suspect may have mildly elevated filling pressures due to diastolic dysfunction (Who Group 2 PH). Will give lasix  20mg  and kcl 20 meq to use prn only. Will check BMET, BNP and CXR. Send for sleep study. Needs more exercise. Repeat screening in 1 year.   Bensimhon, Daniel,MD 12:19 PM

## 2014-08-23 ENCOUNTER — Ambulatory Visit: Payer: Medicare Other | Admitting: Internal Medicine

## 2014-08-28 ENCOUNTER — Other Ambulatory Visit (HOSPITAL_COMMUNITY): Payer: Self-pay | Admitting: *Deleted

## 2014-08-28 MED ORDER — VALSARTAN 80 MG PO TABS: 80.0000 mg | ORAL_TABLET | Freq: Every day | ORAL | Status: DC

## 2014-11-14 ENCOUNTER — Other Ambulatory Visit (HOSPITAL_COMMUNITY): Payer: Self-pay | Admitting: *Deleted

## 2014-11-14 MED ORDER — VALSARTAN 80 MG PO TABS: 80.0000 mg | ORAL_TABLET | Freq: Every day | ORAL | Status: DC

## 2014-11-16 DIAGNOSIS — M19071 Primary osteoarthritis, right ankle and foot: Secondary | ICD-10-CM | POA: Diagnosis not present

## 2014-11-16 DIAGNOSIS — I7300 Raynaud's syndrome without gangrene: Secondary | ICD-10-CM | POA: Diagnosis not present

## 2014-11-16 DIAGNOSIS — M19041 Primary osteoarthritis, right hand: Secondary | ICD-10-CM | POA: Diagnosis not present

## 2014-11-16 DIAGNOSIS — M349 Systemic sclerosis, unspecified: Secondary | ICD-10-CM | POA: Diagnosis not present

## 2014-11-20 DIAGNOSIS — Z23 Encounter for immunization: Secondary | ICD-10-CM | POA: Diagnosis not present

## 2014-11-22 ENCOUNTER — Other Ambulatory Visit: Payer: Self-pay | Admitting: Obstetrics & Gynecology

## 2014-11-22 DIAGNOSIS — N63 Unspecified lump in unspecified breast: Secondary | ICD-10-CM

## 2014-11-27 ENCOUNTER — Other Ambulatory Visit: Payer: Self-pay | Admitting: Obstetrics & Gynecology

## 2014-11-27 DIAGNOSIS — N63 Unspecified lump in unspecified breast: Secondary | ICD-10-CM

## 2014-12-05 ENCOUNTER — Ambulatory Visit
Admission: RE | Admit: 2014-12-05 | Discharge: 2014-12-05 | Disposition: A | Discharge status: Home / Self Care | Payer: Medicare Other | Source: Ambulatory Visit | Attending: Obstetrics & Gynecology | Admitting: Obstetrics & Gynecology

## 2014-12-05 DIAGNOSIS — R928 Other abnormal and inconclusive findings on diagnostic imaging of breast: Secondary | ICD-10-CM | POA: Diagnosis not present

## 2014-12-05 DIAGNOSIS — N63 Unspecified lump in unspecified breast: Secondary | ICD-10-CM

## 2014-12-05 DIAGNOSIS — N6489 Other specified disorders of breast: Secondary | ICD-10-CM | POA: Diagnosis not present

## 2014-12-07 DIAGNOSIS — Z13220 Encounter for screening for lipoid disorders: Secondary | ICD-10-CM | POA: Diagnosis not present

## 2014-12-07 DIAGNOSIS — Z79899 Other long term (current) drug therapy: Secondary | ICD-10-CM | POA: Diagnosis not present

## 2014-12-18 DIAGNOSIS — M8580 Other specified disorders of bone density and structure, unspecified site: Secondary | ICD-10-CM | POA: Diagnosis not present

## 2014-12-18 DIAGNOSIS — Z780 Asymptomatic menopausal state: Secondary | ICD-10-CM | POA: Diagnosis not present

## 2015-01-10 DIAGNOSIS — M1711 Unilateral primary osteoarthritis, right knee: Secondary | ICD-10-CM | POA: Diagnosis not present

## 2015-02-09 ENCOUNTER — Ambulatory Visit (INDEPENDENT_AMBULATORY_CARE_PROVIDER_SITE_OTHER): Payer: Medicare Other | Admitting: Physician Assistant

## 2015-02-09 VITALS — BP 128/72 | HR 88 | Temp 98.3°F | Resp 16 | Ht 66.0 in | Wt 183.0 lb

## 2015-02-09 DIAGNOSIS — R0981 Nasal congestion: Secondary | ICD-10-CM | POA: Diagnosis not present

## 2015-02-09 DIAGNOSIS — J0110 Acute frontal sinusitis, unspecified: Secondary | ICD-10-CM

## 2015-02-09 MED ORDER — AMOXICILLIN-POT CLAVULANATE 875-125 MG PO TABS: 1.0000 | ORAL_TABLET | Freq: Two times a day (BID) | ORAL | Status: DC

## 2015-02-09 NOTE — Progress Notes (Signed)
   Subjective:    Patient ID: Savannah [name], female    DOB: 11-16-1945, 69 y.o.   MRN: [id number]  Chief Complaint  Patient presents with  . Sore Throat    x 9 days  . Nasal Congestion  . Cough   Medications, allergies, past medical history, surgical history, family history, social history and problem list reviewed and updated.  HPI  69 yof presents with above complaints.  Symptoms started 9-10 days ago. Head/nasal congestion. Mild frontal HA. Cough that has become more productive past couple days. Mild but constant st past 9 days. LNs started feeling more swollen past 2 days. Denies fevers or chills.   Review of Systems See HPI.     Objective:   Physical Exam  Constitutional: She appears well-developed and well-nourished.  Non-toxic appearance. She does not have a sickly appearance. She does not appear ill. No distress.  BP 128/72 mmHg  Pulse 88  Temp(Src) 98.3 F (36.8 C)  Resp 16  Ht 5\' 6"  (1.676 m)  Wt 183 lb (83.008 kg)  BMI 29.55 kg/m2  SpO2 97%   HENT:  Right Ear: Tympanic membrane normal.  Left Ear: Tympanic membrane normal.  Nose: Mucosal edema and rhinorrhea present. Right sinus exhibits maxillary sinus tenderness. Right sinus exhibits no frontal sinus tenderness. Left sinus exhibits no maxillary sinus tenderness and no frontal sinus tenderness.  Mouth/Throat: Uvula is midline, oropharynx is clear and moist and mucous membranes are normal.  Pulmonary/Chest: Effort normal and breath sounds normal. No tachypnea.  Lymphadenopathy:       Head (right side): No submental, no submandibular and no tonsillar adenopathy present.       Head (left side): No submental, no submandibular and no tonsillar adenopathy present.    She has no cervical adenopathy.      Assessment & Plan:   Acute frontal sinusitis, recurrence not specified - Plan: amoxicillin-clavulanate (AUGMENTIN) 875-125 MG tablet  Head congestion --sinus congestion, will treat as bacterial etiology as pt is  at Day 9-10 of symptoms and worsened 2 days ago --mucinex-d for ongoing sputum and congestion  Julieta Gutting, PA-C Physician Assistant-Certified [facility]  02/09/2015 6:48 PM

## 2015-02-09 NOTE — Patient Instructions (Signed)
Please take the augmentin twice daily for 10 days.  Taking mucinex-d twice daily for the next few days will help with the congestion and the productive cough.   Sinusitis, Adult Sinusitis is redness, soreness, and inflammation of the paranasal sinuses. Paranasal sinuses are air pockets within the bones of your face. They are located beneath your eyes, in the middle of your forehead, and above your eyes. In healthy paranasal sinuses, mucus is able to drain out, and air is able to circulate through them by way of your nose. However, when your paranasal sinuses are inflamed, mucus and air can become trapped. This can allow bacteria and other germs to grow and cause infection. Sinusitis can develop quickly and last only a short time (acute) or continue over a long period (chronic). Sinusitis that lasts for more than 12 weeks is considered chronic. CAUSES Causes of sinusitis include:  Allergies.  Structural abnormalities, such as displacement of the cartilage that separates your nostrils (deviated septum), which can decrease the air flow through your nose and sinuses and affect sinus drainage.  Functional abnormalities, such as when the small hairs (cilia) that line your sinuses and help remove mucus do not work properly or are not present. SIGNS AND SYMPTOMS Symptoms of acute and chronic sinusitis are the same. The primary symptoms are pain and pressure around the affected sinuses. Other symptoms include:  Upper toothache.  Earache.  Headache.  Bad breath.  Decreased sense of smell and taste.  A cough, which worsens when you are lying flat.  Fatigue.  Fever.  Thick drainage from your nose, which often is green and may contain pus (purulent).  Swelling and warmth over the affected sinuses. DIAGNOSIS Your health care provider will perform a physical exam. During your exam, your health care provider may perform any of the following to help determine if you have acute sinusitis or  chronic sinusitis:  Look in your nose for signs of abnormal growths in your nostrils (nasal polyps).  Tap over the affected sinus to check for signs of infection.  View the inside of your sinuses using an imaging device that has a light attached (endoscope). If your health care provider suspects that you have chronic sinusitis, one or more of the following tests may be recommended:  Allergy tests.  Nasal culture. A sample of mucus is taken from your nose, sent to a lab, and screened for bacteria.  Nasal cytology. A sample of mucus is taken from your nose and examined by your health care provider to determine if your sinusitis is related to an allergy. TREATMENT Most cases of acute sinusitis are related to a viral infection and will resolve on their own within 10 days. Sometimes, medicines are prescribed to help relieve symptoms of both acute and chronic sinusitis. These may include pain medicines, decongestants, nasal steroid sprays, or saline sprays. However, for sinusitis related to a bacterial infection, your health care provider will prescribe antibiotic medicines. These are medicines that will help kill the bacteria causing the infection. Rarely, sinusitis is caused by a fungal infection. In these cases, your health care provider will prescribe antifungal medicine. For some cases of chronic sinusitis, surgery is needed. Generally, these are cases in which sinusitis recurs more than 3 times per year, despite other treatments. HOME CARE INSTRUCTIONS  Drink plenty of water. Water helps thin the mucus so your sinuses can drain more easily.  Use a humidifier.  Inhale steam 3-4 times a day (for example, sit in the bathroom with the  shower running).  Apply a warm, moist washcloth to your face 3-4 times a day, or as directed by your health care provider.  Use saline nasal sprays to help moisten and clean your sinuses.  Take medicines only as directed by your health care provider.  If  you were prescribed either an antibiotic or antifungal medicine, finish it all even if you start to feel better. SEEK IMMEDIATE MEDICAL CARE IF:  You have increasing pain or severe headaches.  You have nausea, vomiting, or drowsiness.  You have swelling around your face.  You have vision problems.  You have a stiff neck.  You have difficulty breathing.   This information is not intended to replace advice given to you by your health care provider. Make sure you discuss any questions you have with your health care provider.   Document Released: 02/03/2005 Document Revised: 02/24/2014 Document Reviewed: 02/18/2011 Elsevier Interactive Patient Education [employer].

## 2015-10-30 ENCOUNTER — Other Ambulatory Visit (HOSPITAL_COMMUNITY): Payer: Self-pay | Admitting: *Deleted

## 2015-10-30 MED ORDER — VALSARTAN 80 MG PO TABS: 80.0000 mg | ORAL_TABLET | Freq: Every day | ORAL | 0 refills | Status: DC

## 2015-11-02 ENCOUNTER — Telehealth (HOSPITAL_COMMUNITY): Payer: Self-pay | Admitting: Vascular Surgery

## 2015-11-02 NOTE — Telephone Encounter (Signed)
Pt called to make 1 yr f/u appt, she states she get echo and pft before her appt w/ DB , If so this pt needs orders for echo and PFT.Marland Kitchen PLEASE ADVISE

## 2015-11-05 ENCOUNTER — Other Ambulatory Visit (HOSPITAL_COMMUNITY): Payer: Self-pay | Admitting: Internal Medicine

## 2015-11-05 DIAGNOSIS — I5022 Chronic systolic (congestive) heart failure: Secondary | ICD-10-CM

## 2015-11-08 ENCOUNTER — Other Ambulatory Visit: Payer: Self-pay | Admitting: Obstetrics & Gynecology

## 2015-11-08 DIAGNOSIS — Z1231 Encounter for screening mammogram for malignant neoplasm of breast: Secondary | ICD-10-CM

## 2015-11-09 ENCOUNTER — Ambulatory Visit (HOSPITAL_COMMUNITY)
Admission: RE | Admit: 2015-11-09 | Discharge: 2015-11-09 | Disposition: A | Discharge status: Home / Self Care | Payer: Medicare Other | Source: Ambulatory Visit | Attending: Internal Medicine | Admitting: Internal Medicine

## 2015-11-09 DIAGNOSIS — J988 Other specified respiratory disorders: Secondary | ICD-10-CM | POA: Diagnosis not present

## 2015-11-09 DIAGNOSIS — I5022 Chronic systolic (congestive) heart failure: Secondary | ICD-10-CM | POA: Diagnosis not present

## 2015-11-09 LAB — PULMONARY FUNCTION TEST
DL/VA % PRED: 66 %
DL/VA: 3.43 ml/min/mmHg/L
DLCO UNC % PRED: 60 %
DLCO UNC: 17.03 ml/min/mmHg
FEF 25-75 POST: 2.34 L/s
FEF 25-75 Pre: 2.35 L/sec
FEF2575-%Change-Post: 0 %
FEF2575-%PRED-POST: 115 %
FEF2575-%PRED-PRE: 116 %
FEV1-%Change-Post: 0 %
FEV1-%Pred-Post: 105 %
FEV1-%Pred-Pre: 104 %
FEV1-Post: 2.67 L
FEV1-Pre: 2.65 L
FEV1FVC-%Change-Post: 3 %
FEV1FVC-%PRED-PRE: 102 %
FEV6-%Change-Post: -2 %
FEV6-%PRED-POST: 102 %
FEV6-%Pred-Pre: 105 %
FEV6-POST: 3.28 L
FEV6-Pre: 3.37 L
FEV6FVC-%CHANGE-POST: 0 %
FEV6FVC-%PRED-POST: 103 %
FEV6FVC-%Pred-Pre: 103 %
FVC-%Change-Post: -2 %
FVC-%Pred-Post: 99 %
FVC-%Pred-Pre: 101 %
FVC-PRE: 3.39 L
FVC-Post: 3.3 L
POST FEV1/FVC RATIO: 81 %
PRE FEV1/FVC RATIO: 78 %
Post FEV6/FVC ratio: 99 %
Pre FEV6/FVC Ratio: 99 %
RV % pred: 80 %
RV: 1.9 L
TLC % PRED: 95 %
TLC: 5.28 L

## 2015-11-09 MED ORDER — ALBUTEROL SULFATE (2.5 MG/3ML) 0.083% IN NEBU
2.5000 mg | INHALATION_SOLUTION | Freq: Once | RESPIRATORY_TRACT | Status: AC
  Administered 2015-11-09: 2.5 mg via RESPIRATORY_TRACT

## 2015-11-14 DIAGNOSIS — M25561 Pain in right knee: Secondary | ICD-10-CM | POA: Diagnosis not present

## 2015-11-14 DIAGNOSIS — M25562 Pain in left knee: Secondary | ICD-10-CM | POA: Diagnosis not present

## 2015-11-16 DIAGNOSIS — E782 Mixed hyperlipidemia: Secondary | ICD-10-CM | POA: Diagnosis not present

## 2015-11-16 DIAGNOSIS — Z79899 Other long term (current) drug therapy: Secondary | ICD-10-CM | POA: Diagnosis not present

## 2015-11-16 DIAGNOSIS — R300 Dysuria: Secondary | ICD-10-CM | POA: Diagnosis not present

## 2015-11-16 DIAGNOSIS — E559 Vitamin D deficiency, unspecified: Secondary | ICD-10-CM | POA: Diagnosis not present

## 2015-11-16 DIAGNOSIS — R5381 Other malaise: Secondary | ICD-10-CM | POA: Diagnosis not present

## 2015-11-16 LAB — CBC AND DIFFERENTIAL
HCT: 91 % — AB (ref 36–46)
Hemoglobin: 14.4 g/dL (ref 12.0–16.0)
Neutrophils Absolute: 3538 /uL
PLATELETS: 258 10*3/uL (ref 150–399)
WBC: 6.1 10*3/mL

## 2015-11-16 LAB — LIPID PANEL
Cholesterol: 169 mg/dL (ref 0–200)
HDL: 48 mg/dL (ref 35–70)
LDL CALC: 85 mg/dL
LDl/HDL Ratio: 3.5
TRIGLYCERIDES: 178 mg/dL — AB (ref 40–160)

## 2015-11-16 LAB — BASIC METABOLIC PANEL
BUN: 16 mg/dL (ref 4–21)
CREATININE: 0.8 mg/dL (ref 0.5–1.1)
Glucose: 107 mg/dL
Potassium: 4.6 mmol/L (ref 3.4–5.3)
Sodium: 139 mmol/L (ref 137–147)

## 2015-11-16 LAB — HEPATIC FUNCTION PANEL
ALK PHOS: 53 U/L (ref 25–125)
ALT: 23 U/L (ref 7–35)
AST: 19 U/L (ref 13–35)
BILIRUBIN, TOTAL: 1 mg/dL

## 2015-11-19 ENCOUNTER — Ambulatory Visit (HOSPITAL_BASED_OUTPATIENT_CLINIC_OR_DEPARTMENT_OTHER)
Admission: RE | Admit: 2015-11-19 | Discharge: 2015-11-19 | Disposition: A | Discharge status: Home / Self Care | Payer: Medicare Other | Source: Ambulatory Visit | Attending: Internal Medicine | Admitting: Internal Medicine

## 2015-11-19 ENCOUNTER — Encounter (HOSPITAL_COMMUNITY): Payer: Self-pay | Admitting: Internal Medicine

## 2015-11-19 ENCOUNTER — Ambulatory Visit (HOSPITAL_COMMUNITY)
Admission: RE | Admit: 2015-11-19 | Discharge: 2015-11-19 | Disposition: A | Discharge status: Home / Self Care | Payer: Medicare Other | Source: Ambulatory Visit | Attending: Family Medicine | Admitting: Family Medicine

## 2015-11-19 VITALS — HR 74 | Wt 183.5 lb

## 2015-11-19 DIAGNOSIS — Z7982 Long term (current) use of aspirin: Secondary | ICD-10-CM | POA: Insufficient documentation

## 2015-11-19 DIAGNOSIS — Z888 Allergy status to other drugs, medicaments and biological substances status: Secondary | ICD-10-CM | POA: Insufficient documentation

## 2015-11-19 DIAGNOSIS — G459 Transient cerebral ischemic attack, unspecified: Secondary | ICD-10-CM | POA: Insufficient documentation

## 2015-11-19 DIAGNOSIS — I5022 Chronic systolic (congestive) heart failure: Secondary | ICD-10-CM | POA: Diagnosis not present

## 2015-11-19 DIAGNOSIS — M349 Systemic sclerosis, unspecified: Secondary | ICD-10-CM

## 2015-11-19 DIAGNOSIS — E785 Hyperlipidemia, unspecified: Secondary | ICD-10-CM | POA: Insufficient documentation

## 2015-11-19 DIAGNOSIS — R942 Abnormal results of pulmonary function studies: Secondary | ICD-10-CM | POA: Diagnosis not present

## 2015-11-19 DIAGNOSIS — Z8673 Personal history of transient ischemic attack (TIA), and cerebral infarction without residual deficits: Secondary | ICD-10-CM | POA: Diagnosis not present

## 2015-11-19 DIAGNOSIS — M25561 Pain in right knee: Secondary | ICD-10-CM | POA: Insufficient documentation

## 2015-11-19 DIAGNOSIS — Z79899 Other long term (current) drug therapy: Secondary | ICD-10-CM | POA: Diagnosis not present

## 2015-11-19 DIAGNOSIS — R0683 Snoring: Secondary | ICD-10-CM | POA: Diagnosis not present

## 2015-11-19 DIAGNOSIS — I110 Hypertensive heart disease with heart failure: Secondary | ICD-10-CM | POA: Insufficient documentation

## 2015-11-19 MED ORDER — PITAVASTATIN CALCIUM 2 MG PO TABS: 2.0000 mg | ORAL_TABLET | Freq: Every day | ORAL | 0 refills | Status: DC

## 2015-11-19 MED ORDER — VALSARTAN 80 MG PO TABS: 80.0000 mg | ORAL_TABLET | Freq: Every day | ORAL | 3 refills | Status: DC

## 2015-11-19 MED ORDER — POTASSIUM CHLORIDE CRYS ER 20 MEQ PO TBCR: 20.0000 meq | EXTENDED_RELEASE_TABLET | Freq: Every day | ORAL | 3 refills | Status: DC | PRN

## 2015-11-19 MED ORDER — FUROSEMIDE 20 MG PO TABS: 20.0000 mg | ORAL_TABLET | Freq: Every day | ORAL | 3 refills | Status: DC | PRN

## 2015-11-19 NOTE — Addendum Note (Signed)
Encounter addended by: Scarlette Calico, RN on: 11/19/2015  4:05 PM<BR>    Actions taken: Order Entry activity accessed, Diagnosis association updated, Sign clinical note

## 2015-11-19 NOTE — Progress Notes (Signed)
  Echocardiogram 2D Echocardiogram has been performed.  Savannah [name] 11/19/2015, 3:05 PM

## 2015-11-19 NOTE — Progress Notes (Signed)
Patient ID: Savannah [name], female   DOB: 1945-04-07, 70 y.o.   MRN: [id number]  Referring Physician: Estanislado Pandy Primary Care: Deveshwar Primary Cardiologist: Debara Pickett  HPI: Savannah [name] is a 70 y.o. female with hx of Scleroderma, TIA, HTN, Dyslipidemia, and LE varicose veins. She was referred by Dr. Estanislado Pandy for screening for [facility] in setting of scleroderma.  Says she was diagnosed with scleroderma around 2005. Noticed skin on her fingers had bx which confirmed scleroderma.   Had dyspnea in 2008. And had a stress test and echo. Her EF was  45-55% by Echo 11/08. Myoview in 11/08 negative for ischemia with post stress test EF of 81%.  Here for routine f/u. Still with mild DOE but mainly right knee pain and has been told she need right knee TKR. No edema, orthopnea, PND, presyncope. No cough. Snores heavily. Fatigued a lot.   Echo 08/18/14 EF 60% Grade I DD. Normal RV. No TR.   PFTs  11/09/15 FEV1 2.65 L (99%) FVC   3.39 L (101%) DLCO  60%  PFTs 08/08/14 FEV1 2.54 L (99%) FVC   3.36 L (99%) DLCO  66%   Past Medical History:  Diagnosis Date  . Dyspnea    MYOVIEW, 12/29/2006 - post-stress EF 81%,no ECG changes, EKG negative for ischemia  . Hyperlipidemia   . Hypertension    2D ECHO, 12/24/2006 - EF 45-55%, normal  . Swelling of extremity    LEA VENOUS DUPLEX, 08/19/2011 - deep valvular insufficiency noted with right femoral and popliteal veins and left common femoral vein  . TIA (transient ischemic attack)    CAROTID DOPPLER, 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    Current Outpatient Prescriptions  Medication Sig Dispense Refill  . aspirin 81 MG tablet Take 81 mg by mouth.    . Cholecalciferol (VITAMIN D3) 50000 units TABS Take 50,000 Units by mouth once a week.    . Coenzyme Q10 (CO Q 10) 100 MG CAPS Take 1 capsule by mouth daily.    . DiphenhydrAMINE HCl (BENADRYL ALLERGY PO) Take by mouth as needed.    . furosemide (LASIX) 20 MG tablet Take 1 tablet (20 mg  total) by mouth daily as needed for fluid or edema. 30 tablet 3  . Menaquinone-7 (VITAMIN K2 PO) Take 50 mcg by mouth once a week.    . Pitavastatin Calcium (LIVALO) 2 MG TABS Take by mouth.    . potassium chloride SA (KLOR-CON M20) 20 MEQ tablet Take 1 tablet (20 mEq total) by mouth daily as needed. When you take Furosemide (Lasix). 30 tablet 3  . valsartan (DIOVAN) 80 MG tablet Take 1 tablet (80 mg total) by mouth daily. 90 tablet 0  . vitamin E 400 UNIT capsule Take 400 Units by mouth daily.    Marland Kitchen ibuprofen (ADVIL,MOTRIN) 200 MG tablet Take 200 mg by mouth every 6 (six) hours as needed.     No current facility-administered medications for this encounter.     Allergies  Allergen Reactions  . Codeine Nausea Only  . Cymbalta [Duloxetine Hcl] Hives  . Lyrica [Pregabalin] Hives  . Sulfa Antibiotics Hives  . Zetia [Ezetimibe] Other (See Comments)    Myalgia  . Zocor [Simvastatin] Other (See Comments)    Myalgia      Social History   Social History  . Marital status: Married    Spouse name: N/A  . Number of children: N/A  . Years of education: N/A   Occupational History  .  Not on file.   Social History Main Topics  . Smoking status: Never Smoker  . Smokeless tobacco: Never Used  . Alcohol use 3.0 oz/week    6 drink(s) per week  . Drug use: No  . Sexual activity: Not on file   Other Topics Concern  . Not on file   Social History Narrative  . No narrative on file      Family History  Problem Relation Age of Onset  . Atrial fibrillation Mother 37  . Hypertension Mother   . Hyperlipidemia Father   . Cirrhosis Father 55  . Kidney failure Father 37  . Stroke Maternal Grandmother 85  . Hypertension Maternal Grandmother 40  . Angina Maternal Grandfather 85  . Heart attack Maternal Grandfather 88  . Lung disease Paternal Grandmother     Vitals:   11/19/15 1519  Pulse: 74  SpO2: 100%  Weight: 183 lb 8 oz (83.2 kg)   BP 128/68  PHYSICAL EXAM: General:  Well  appearing. No respiratory difficulty HEENT: normal Neck: supple. no JVD. Carotids 2+ bilat; no bruits. No lymphadenopathy or thryomegaly appreciated. Cor: PMI nondisplaced. Regular rate & rhythm.+s4 no murmur Lungs: clear Abdomen: soft, nontender, nondistended. No hepatosplenomegaly. No bruits or masses. Good bowel sounds. Extremities: no cyanosis, clubbing, rash, no edema  Neuro: alert & oriented x 3, cranial nerves grossly intact. moves all 4 extremities w/o difficulty. Affect pleasant.   ASSESSMENT & PLAN:  1. Scleroderma 2. HTN  3. Dyslipidemia  4. TIA - no deficits 5. Snoring  Echo is normal with no evidence of PAH. On PFTs her spirometry is normal. DLCO remains mildly reduced of unclear etiology. Will get hir-res CT of chest to look for ILD. Have again suggested need for sleep study.  Will Repeat screening in 1 year.   Joslynn Jamroz,MD 3:42 PM

## 2015-11-19 NOTE — Addendum Note (Signed)
Encounter addended by: Scarlette Calico, RN on: 11/19/2015  4:07 PM<BR>    Actions taken: Order Entry activity accessed

## 2015-11-19 NOTE — Patient Instructions (Signed)
High Resolution CT Scan  We will contact you in 1 year to schedule your next appointment.

## 2015-11-22 ENCOUNTER — Ambulatory Visit (HOSPITAL_COMMUNITY)
Admission: RE | Admit: 2015-11-22 | Discharge: 2015-11-22 | Disposition: A | Discharge status: Home / Self Care | Payer: Medicare Other | Source: Ambulatory Visit | Attending: Internal Medicine | Admitting: Internal Medicine

## 2015-11-22 DIAGNOSIS — I2510 Atherosclerotic heart disease of native coronary artery without angina pectoris: Secondary | ICD-10-CM | POA: Insufficient documentation

## 2015-11-22 DIAGNOSIS — I700 Atherosclerosis of aorta: Secondary | ICD-10-CM | POA: Diagnosis not present

## 2015-11-22 DIAGNOSIS — R942 Abnormal results of pulmonary function studies: Secondary | ICD-10-CM

## 2015-11-22 DIAGNOSIS — R0602 Shortness of breath: Secondary | ICD-10-CM | POA: Diagnosis not present

## 2015-11-22 DIAGNOSIS — R918 Other nonspecific abnormal finding of lung field: Secondary | ICD-10-CM | POA: Diagnosis not present

## 2015-11-27 ENCOUNTER — Other Ambulatory Visit (HOSPITAL_COMMUNITY): Payer: Self-pay | Admitting: Internal Medicine

## 2015-11-29 DIAGNOSIS — H2513 Age-related nuclear cataract, bilateral: Secondary | ICD-10-CM | POA: Diagnosis not present

## 2015-11-29 DIAGNOSIS — H10413 Chronic giant papillary conjunctivitis, bilateral: Secondary | ICD-10-CM | POA: Diagnosis not present

## 2015-11-29 DIAGNOSIS — H524 Presbyopia: Secondary | ICD-10-CM | POA: Diagnosis not present

## 2015-11-29 DIAGNOSIS — H35372 Puckering of macula, left eye: Secondary | ICD-10-CM | POA: Diagnosis not present

## 2015-12-03 DIAGNOSIS — D235 Other benign neoplasm of skin of trunk: Secondary | ICD-10-CM | POA: Diagnosis not present

## 2015-12-03 DIAGNOSIS — D1801 Hemangioma of skin and subcutaneous tissue: Secondary | ICD-10-CM | POA: Diagnosis not present

## 2015-12-03 DIAGNOSIS — L814 Other melanin hyperpigmentation: Secondary | ICD-10-CM | POA: Diagnosis not present

## 2015-12-03 DIAGNOSIS — D485 Neoplasm of uncertain behavior of skin: Secondary | ICD-10-CM | POA: Diagnosis not present

## 2015-12-03 DIAGNOSIS — D2261 Melanocytic nevi of right upper limb, including shoulder: Secondary | ICD-10-CM | POA: Diagnosis not present

## 2015-12-03 DIAGNOSIS — L821 Other seborrheic keratosis: Secondary | ICD-10-CM | POA: Diagnosis not present

## 2015-12-03 DIAGNOSIS — L720 Epidermal cyst: Secondary | ICD-10-CM | POA: Diagnosis not present

## 2015-12-10 ENCOUNTER — Telehealth: Payer: Self-pay | Admitting: Internal Medicine

## 2015-12-10 ENCOUNTER — Encounter: Payer: Self-pay | Admitting: Internal Medicine

## 2015-12-10 ENCOUNTER — Ambulatory Visit (INDEPENDENT_AMBULATORY_CARE_PROVIDER_SITE_OTHER): Payer: Medicare Other | Admitting: Internal Medicine

## 2015-12-10 VITALS — BP 138/76 | HR 86 | Ht 66.0 in | Wt 184.8 lb

## 2015-12-10 DIAGNOSIS — R0689 Other abnormalities of breathing: Secondary | ICD-10-CM | POA: Diagnosis not present

## 2015-12-10 DIAGNOSIS — I519 Heart disease, unspecified: Secondary | ICD-10-CM

## 2015-12-10 DIAGNOSIS — J849 Interstitial pulmonary disease, unspecified: Secondary | ICD-10-CM | POA: Diagnosis not present

## 2015-12-10 DIAGNOSIS — R5381 Other malaise: Secondary | ICD-10-CM | POA: Diagnosis not present

## 2015-12-10 DIAGNOSIS — I5189 Other ill-defined heart diseases: Secondary | ICD-10-CM

## 2015-12-10 DIAGNOSIS — R0600 Dyspnea, unspecified: Secondary | ICD-10-CM | POA: Diagnosis not present

## 2015-12-10 NOTE — Patient Instructions (Signed)
ICD-9-CM ICD-10-CM   1. Dyspnea and respiratory abnormality 786.09 R06.00     R06.89   2. ILD (interstitial lung disease) ([place]) 515 J84.9   3. Physical deconditioning 799.3 R53.81   4. Diastolic dysfunction A999333 I51.9     Shortness of breath is due to a #2, #3 and #4  The interstitial lung disease which is #2 is very mild at this stage and my recommendation is to keep a close eye because the treatment for this is only preventative and requires a medication called CellCept   I think at this point you should address the physical deconditioning #3 which is partly due to weight and partly due to your right knee problems. Recommended resting the knee issues at this point  After the knees addressed issue to pulmonary rehabilitation which will address both #3 physical deconditioning and #4 diastolic dysfunction  Follow-up - We will continue to monitor #2 which is interstitial lung disease by doing a breathing test in 6 months  - Pre-bd spiro and dlco only. No lung volume or bd response. No post-bd spiro in 6 months  Return to see me after the breathing test in 6 months-   -

## 2015-12-10 NOTE — Telephone Encounter (Signed)
YES

## 2015-12-10 NOTE — Telephone Encounter (Signed)
Patient came in the office wanting to know if you would take her on as a new patient. She states her mother Mechele Collin was your patient. Please advise, Thank you.

## 2015-12-10 NOTE — Telephone Encounter (Signed)
LVm to call back and make a new patient appointment.

## 2015-12-10 NOTE — Progress Notes (Signed)
Subjective:    Patient ID: Savannah [name], female    DOB: 12/22/1945, 70 y.o.   MRN: [id number]  PCP Hayden Rasmussen., MD   HPI   IOV 12/10/2015 - history some the patient and review of the outside records from Dr. Bronson Curb in cardiology office notes 11/19/2015 Dr. Haroldine Laws  Chief Complaint  Patient presents with  . Pulmonary Consult    Referred by Dr Estanislado Pandy- Scleroderma - Had HRCT 11/22/15- SOB with walking up driveway or up steps - Denies cough or wheezing    70 year old female was diagnosed with scleroderma in 2008 when she was 70 years old. Notice many problems with the skin in her fingers and the form of a sclerodactyly. I According to her history and referral notes she did have a biopsy with Dr. Wilhemina Bonito that confirmed the scleroderma diagnosis     In 2008 she had dyspnea. She had a cardiac stress test at that time she had a Myoview for stress ejection fraction was 81% but resting echo showe d ejection fraction of 45%. She does not remember this but this is per cardiology notes. He tells me now that she's had insidious onset of dyspnea for the last few years. It has been steadily progressive. It is moderate in intensity. She notices this when she climbs stairs or goes to [employer]. It is relieved by rest. It is not associated with any chest pain. This no associated cough or edema or orthopnea paroxysmal nocturnal dyspnea presyncope. According to the cardiology notes she snores a lot.  She had echocardiogram this fall 2017 document below that shows grade 1 diastolic dysfunction with good left ventricular ejection fraction. Right ventricular pump function is normal. No comment on pulmonary systolic pressures.  She underwent pulmonary function test that shows isolated reduction in diffusion capacity documented below. She then underwent high-resolution CT chest that I personally visualized shows some faint groundglass opacities. Therefore she has been referred here.  She tells me  that for her scleroderma she's been treated with methotrexate in The past. She Does Have the Details. She Says This Did Not Help Her. Overall She Is a Nonsmoker. She Drinks Alcohol Socially. She Does Walking for Exercise but Does Have Right Knee Joint Discomfort. Rheumatology Notes Indicate Significant Issues with Osteoarthritis Especially of the Right Knee Is Documented in Office Visits in November 2016 and Subsequently.Also arthritis is also documented in her hand joints   Tests below  Pulmonary function test 11/09/2015 FVC 2.4 L/1%, FEV1 2.6 L/104%. DLCO 5.3/95% and DLCO 17.03/60%. She has isolated reduction in diffusion capacity  IMPRESSION: HRCT 11/22/15 1. Mild patchy subpleural ground-glass attenuation and reticulation in both lungs, most prominent in the lower lobes. No significant traction bronchiectasis. No frank honeycombing. 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 A follow-up high-resolution chest CT study in 12 months would be useful to assess temporal pattern stability. 2. Additional findings include aortic atherosclerosis, three-vessel coronary atherosclerosis and possible 0.9 cm lower left thyroid lobe nodule.   Electronically Signed   By: [name] M.D.   On: 11/22/2015 13:14  Walking desaturation test 185 feet 3 laps on room air today in the office 12/10/2015: Resting pulse ox was 94% after 3 laps pulse ox was 98%. She had some pain with her right knee where she had no dyspnea. Resting heart rate was 87/m [place] to 93/m    Echocardiogram 11/19/2015 shows grade 1 diastolic dysfunction but otherwise reported as normal. Including right  ventricle     has a past medical history of Dyspnea; Hyperlipidemia; Hypertension; Swelling of extremity; and TIA (transient ischemic attack).   reports that she has never smoked. She has never used smokeless tobacco.  History reviewed. No pertinent surgical  history.  Allergies  Allergen Reactions  . Codeine Nausea Only  . Cymbalta [Duloxetine Hcl] Hives  . Lyrica [Pregabalin] Hives  . Sulfa Antibiotics Hives  . Zetia [Ezetimibe] Other (See Comments)    Myalgia  . Zocor [Simvastatin] Other (See Comments)    Myalgia    Immunization History  Administered Date(s) Administered  . Pneumococcal-Unspecified 12/09/2013  . Zoster 12/09/2009    Family History  Problem Relation Age of Onset  . Hyperlipidemia Father   . Cirrhosis Father 80  . Kidney failure Father 59  . Stroke Maternal Grandmother 85  . Hypertension Maternal Grandmother 40  . Angina Maternal Grandfather 36  . Heart attack Maternal Grandfather 88  . Lung disease Paternal Grandmother   . Atrial fibrillation Mother 5  . Hypertension Mother      Current Outpatient Prescriptions:  .  ASPERCREME W/LIDOCAINE EX, Apply topically. Apply as needed to affected area, Disp: , Rfl:  .  aspirin 81 MG tablet, Take 81 mg by mouth., Disp: , Rfl:  .  Cholecalciferol (VITAMIN D3) 50000 units TABS, Take 50,000 Units by mouth once a week., Disp: , Rfl:  .  Coenzyme Q10 (CO Q 10) 100 MG CAPS, Take 1 capsule by mouth daily., Disp: , Rfl:  .  DiphenhydrAMINE HCl (BENADRYL ALLERGY PO), Take by mouth as needed., Disp: , Rfl:  .  furosemide (LASIX) 20 MG tablet, Take 1 tablet (20 mg total) by mouth daily as needed for fluid or edema., Disp: 90 tablet, Rfl: 3 .  ibuprofen (ADVIL,MOTRIN) 200 MG tablet, Take 200 mg by mouth every 6 (six) hours as needed., Disp: , Rfl:  .  loratadine (CLARITIN) 10 MG tablet, Take 10 mg by mouth daily as needed for allergies., Disp: , Rfl:  .  Menaquinone-7 (VITAMIN K2 PO), Take 50 mcg by mouth once a week., Disp: , Rfl:  .  Pitavastatin Calcium (LIVALO) 2 MG TABS, Take 1 tablet (2 mg total) by mouth daily., Disp: 90 tablet, Rfl: 0 .  potassium chloride SA (KLOR-CON M20) 20 MEQ tablet, Take 1 tablet (20 mEq total) by mouth daily as needed. When you take Furosemide  (Lasix)., Disp: 90 tablet, Rfl: 3 .  valsartan (DIOVAN) 80 MG tablet, Take 1 tablet (80 mg total) by mouth daily., Disp: 90 tablet, Rfl: 3 .  vitamin E 400 UNIT capsule, Take 400 Units by mouth daily., Disp: , Rfl:     Review of Systems  Constitutional: Negative for fever and unexpected weight change.  HENT: Positive for sinus pressure and sore throat. Negative for congestion, dental problem, ear pain, nosebleeds, postnasal drip, rhinorrhea, sneezing and trouble swallowing.   Eyes: Negative for redness and itching.  Respiratory: Positive for shortness of breath. Negative for cough, chest tightness and wheezing.   Cardiovascular: Negative for palpitations and leg swelling.  Gastrointestinal: Negative for nausea and vomiting.  Genitourinary: Negative for dysuria.  Musculoskeletal: Positive for joint swelling.  Skin: Negative for rash.  Neurological: Negative for headaches.  Hematological: Does not bruise/bleed easily.  Psychiatric/Behavioral: Negative for dysphoric mood. The patient is not nervous/anxious.        Objective:   Physical Exam  Constitutional: She is oriented to person, place, and time. She appears well-developed and well-nourished. No distress.  HENT:  Head: Normocephalic and atraumatic.  Right Ear: External ear normal.  Left Ear: External ear normal.  Mouth/Throat: Oropharynx is clear and moist. No oropharyngeal exudate.  Eyes: Conjunctivae and EOM are normal. Pupils are equal, round, and reactive to light. Right eye exhibits no discharge. Left eye exhibits no discharge. No scleral icterus.  Neck: Normal range of motion. Neck supple. No JVD present. No tracheal deviation present. No thyromegaly present.  Cardiovascular: Normal rate, regular rhythm, normal heart sounds and intact distal pulses.  Exam reveals no gallop and no friction rub.   No murmur heard. Pulmonary/Chest: Effort normal and breath sounds normal. No respiratory distress. She has no wheezes. She has no  rales. She exhibits no tenderness.  No crackles  Abdominal: Soft. Bowel sounds are normal. She exhibits no distension and no mass. There is no tenderness. There is no rebound and no guarding.  Musculoskeletal: Normal range of motion. She exhibits no edema or tenderness.  Right knee antalgic gait Osteoarthritis of the fingers  Lymphadenopathy:    She has no cervical adenopathy.  Neurological: She is alert and oriented to person, place, and time. She has normal reflexes. No cranial nerve deficit. She exhibits normal muscle tone. Coordination normal.  Skin: Skin is warm and dry. No rash noted. She is not diaphoretic. No erythema. No pallor.  Mild skin tightening of the fingers  Psychiatric: She has a normal mood and affect. Her behavior is normal. Judgment and thought content normal.  Vitals reviewed.   Vitals:   12/10/15 1043  BP: 138/76  Pulse: 86  SpO2: 97%  Weight: 184 lb 12.8 oz (83.8 kg)  Height: 5\' 6"  (1.676 m)  ;      Assessment & Plan:     ICD-9-CM ICD-10-CM   1. Dyspnea and respiratory abnormality 786.09 R06.00     R06.89   2. ILD (interstitial lung disease) ([place]) 515 J84.9   3. Physical deconditioning 799.3 R53.81   4. Diastolic dysfunction A999333 I51.9     Shortness of breath is due to a #2, #3 and #4. She probably has early onset interstitial lung disease. This probably due to her scleroderma. CellCept therapy would be preventative. However the course of this interstitial lung disease is extremely variable with some patients being stable in some patients progressing. At this point CellCept therapy risk profile would be more significant because her dyspnea is mostly coming from a physical deconditioning, obesity and her diastolic dysfunction and some from her interstitial lung disease. The physical deconditioning component is largely due to her right knee issues and her weight issues. I think the pain from the right knee is significant as stated by the patient and review  of the chart that she's had multiple steroid injections. It is probably best especially in anticipation of any potential future immunomodulatory therapy that she had breast and right knee issue [apparently total knee replacement is being considered] first and then undergo physical therapy and pulmonary rehabilitation to help her shortness of breath. In the interim will continue to serially monitor her interstitial lung disease with Pulmonary  function test and institute CellCept therapy if she worsens    The interstitial lung disease which is #2 is very mild at this stage and my recommendation is to keep a close eye because the treatment for this is only preventative and requires a medication called CellCept   I think at this point you should address the physical deconditioning #3 which is partly due to weight and partly due to  your right knee problems. Recommended resting the knee issues at this point  After the knees addressed issue to pulmonary rehabilitation which will address both #3 physical deconditioning and #4 diastolic dysfunction  Follow-up - We will continue to monitor #2 which is interstitial lung disease by doing a breathing test in 6 months  - Pre-bd spiro and dlco only. No lung volume or bd response. No post-bd spiro in 6 months  Return to see me after the breathing test in 6 months-   -     Dr. Brand Males, M.D., [facility].C.P Pulmonary and Critical Care Medicine Staff Physician [facility] Pager: [phone], If no answer or between  15:00h - 7:00h: call [phone]  12/10/2015 11:42 AM

## 2015-12-11 ENCOUNTER — Ambulatory Visit
Admission: RE | Admit: 2015-12-11 | Discharge: 2015-12-11 | Disposition: A | Discharge status: Home / Self Care | Payer: Medicare Other | Source: Ambulatory Visit | Attending: Obstetrics & Gynecology | Admitting: Obstetrics & Gynecology

## 2015-12-11 DIAGNOSIS — Z1231 Encounter for screening mammogram for malignant neoplasm of breast: Secondary | ICD-10-CM

## 2015-12-19 ENCOUNTER — Telehealth: Payer: Self-pay

## 2015-12-19 DIAGNOSIS — M8589 Other specified disorders of bone density and structure, multiple sites: Secondary | ICD-10-CM | POA: Diagnosis not present

## 2015-12-19 LAB — HM DEXA SCAN

## 2015-12-19 NOTE — Telephone Encounter (Signed)
PATIENT IS GOING TO A NEW PCP AND SHE IS FILLING OUT A QUESTIONNAIRE FORM. ONE OF THE QUESTIONS IS ASKING WHEN SHE HAD HER LAST TETANUS SHOT? SHE NEEDS TO KNOW THE EXACT DATE. SHE THINKS IT WOULD HAVE BEEN YEARS AGO. BEST PHONE [phone] (CELL)  [place]

## 2015-12-20 NOTE — Telephone Encounter (Signed)
No hx. Of tetanus here. Patient advised.

## 2015-12-24 ENCOUNTER — Encounter: Payer: Self-pay | Admitting: Family Medicine

## 2015-12-26 ENCOUNTER — Telehealth: Payer: Self-pay | Admitting: Radiology

## 2015-12-26 NOTE — Telephone Encounter (Signed)
Patient needs follow up appt with Dr Estanislado Pandy pls call to make appt.

## 2015-12-26 NOTE — Telephone Encounter (Signed)
Call pt/ bone density received -1.6 T score

## 2015-12-27 ENCOUNTER — Encounter: Payer: Self-pay | Admitting: Internal Medicine

## 2015-12-27 ENCOUNTER — Ambulatory Visit (INDEPENDENT_AMBULATORY_CARE_PROVIDER_SITE_OTHER): Payer: Medicare Other | Admitting: Internal Medicine

## 2015-12-27 VITALS — BP 128/80 | HR 68 | Temp 98.3°F | Resp 16 | Ht 66.0 in | Wt 185.8 lb

## 2015-12-27 DIAGNOSIS — I10 Essential (primary) hypertension: Secondary | ICD-10-CM

## 2015-12-27 DIAGNOSIS — R739 Hyperglycemia, unspecified: Secondary | ICD-10-CM | POA: Insufficient documentation

## 2015-12-27 DIAGNOSIS — E785 Hyperlipidemia, unspecified: Secondary | ICD-10-CM | POA: Diagnosis not present

## 2015-12-27 DIAGNOSIS — E781 Pure hyperglyceridemia: Secondary | ICD-10-CM | POA: Diagnosis not present

## 2015-12-27 NOTE — Progress Notes (Signed)
Pre visit review using our clinic review tool, if applicable. No additional management support is needed unless otherwise documented below in the visit note. 

## 2015-12-27 NOTE — Progress Notes (Signed)
Subjective:  Patient ID: Savannah [name], female    DOB: 1945-06-24  Age: 70 y.o. MRN: [id number]  CC: Hypertension and Hyperlipidemia   HPI Savannah [name] presents for Establishing as a new patient. She is being followed by cardiology in an investigational study that monitors the cardiac effects of scleroderma. She recently had an echocardiogram that showed mild left ventricular hypertrophy and grade 1 diastolic dysfunction. She tells me her blood pressure has been well controlled on the ARB and she has had no recent episodes of chest pain/shortness of breath/palpitations/edema/fatigue. She is also taking a statin for cardiovascular risk reduction and is tolerating it well with no muscle or joint aches.  History Declan has a past medical history of Dyspnea; Hyperlipidemia; Hypertension; Swelling of extremity; and TIA (transient ischemic attack).   She has no past surgical history on file.   Her family history includes Angina (age of onset: 73) in her maternal grandfather; Atrial fibrillation (age of onset: 27) in her mother; Cirrhosis (age of onset: 87) in her father; Heart attack (age of onset: 48) in her maternal grandfather; Hyperlipidemia in her father; Hypertension in her mother; Hypertension (age of onset: 93) in her maternal grandmother; Kidney failure (age of onset: 28) in her father; Lung disease in her paternal grandmother; Stroke (age of onset: 91) in her maternal grandmother.She reports that she has never smoked. She has never used smokeless tobacco. She reports that she drinks about 3.0 oz of alcohol per week . She reports that she does not use drugs.  Outpatient Medications Prior to Visit  Medication Sig Dispense Refill  . aspirin 81 MG tablet Take 81 mg by mouth.    . Cholecalciferol (VITAMIN D3) 50000 units TABS Take 50,000 Units by mouth once a week.    . furosemide (LASIX) 20 MG tablet Take 1 tablet (20 mg total) by mouth daily as needed for fluid or edema. 90 tablet 3  . loratadine  (CLARITIN) 10 MG tablet Take 10 mg by mouth daily as needed for allergies.    . Menaquinone-7 (VITAMIN K2 PO) Take 50 mcg by mouth once a week.    . Pitavastatin Calcium (LIVALO) 2 MG TABS Take 1 tablet (2 mg total) by mouth daily. 90 tablet 0  . potassium chloride SA (KLOR-CON M20) 20 MEQ tablet Take 1 tablet (20 mEq total) by mouth daily as needed. When you take Furosemide (Lasix). 90 tablet 3  . valsartan (DIOVAN) 80 MG tablet Take 1 tablet (80 mg total) by mouth daily. 90 tablet 3  . vitamin E 400 UNIT capsule Take 400 Units by mouth daily.    . ASPERCREME W/LIDOCAINE EX Apply topically. Apply as needed to affected area    . Coenzyme Q10 (CO Q 10) 100 MG CAPS Take 1 capsule by mouth daily.    . DiphenhydrAMINE HCl (BENADRYL ALLERGY PO) Take by mouth as needed.    Marland Kitchen ibuprofen (ADVIL,MOTRIN) 200 MG tablet Take 200 mg by mouth every 6 (six) hours as needed.     No facility-administered medications prior to visit.     ROS Review of Systems  Constitutional: Negative for activity change, appetite change, diaphoresis, fatigue and fever.  HENT: Negative.  Negative for trouble swallowing.   Eyes: Negative for visual disturbance.  Respiratory: Negative.  Negative for cough, choking, chest tightness, shortness of breath, wheezing and stridor.   Cardiovascular: Negative.  Negative for chest pain, palpitations and leg swelling.  Gastrointestinal: Negative for abdominal pain, blood in stool, constipation, diarrhea, nausea  and vomiting.  Endocrine: Negative.   Genitourinary: Negative.   Musculoskeletal: Negative for arthralgias, back pain, joint swelling and myalgias.  Skin: Negative.  Negative for color change and rash.  Allergic/Immunologic: Negative.   Neurological: Negative.   Hematological: Negative.  Negative for adenopathy. Does not bruise/bleed easily.  Psychiatric/Behavioral: Negative.     Objective:  BP 128/80 (BP Location: Left Arm, Patient Position: Sitting, Cuff Size: Normal)    Pulse 68   Temp 98.3 F (36.8 C) (Oral)   Resp 16   Ht 5\' 6"  (1.676 m)   Wt 185 lb 12 oz (84.3 kg)   SpO2 98%   BMI 29.98 kg/m   Physical Exam  Constitutional: She is oriented to person, place, and time. No distress.  HENT:  Mouth/Throat: Oropharynx is clear and moist. No oropharyngeal exudate.  Eyes: Conjunctivae are normal. Right eye exhibits no discharge. Left eye exhibits no discharge. No scleral icterus.  Neck: Normal range of motion. Neck supple. No JVD present. No tracheal deviation present. No thyromegaly present.  Cardiovascular: Normal rate, regular rhythm, normal heart sounds and intact distal pulses.  Exam reveals no gallop and no friction rub.   No murmur heard. Pulmonary/Chest: Effort normal and breath sounds normal. No stridor. No respiratory distress. She has no wheezes. She has no rales. She exhibits no tenderness.  Abdominal: Soft. Bowel sounds are normal. She exhibits no distension and no mass. There is no tenderness. There is no rebound and no guarding.  Musculoskeletal: Normal range of motion. She exhibits no edema, tenderness or deformity.  Lymphadenopathy:    She has no cervical adenopathy.  Neurological: She is oriented to person, place, and time.  Skin: Skin is warm and dry. No rash noted. She is not diaphoretic. No erythema. No pallor.  Vitals reviewed.   Lab Results  Component Value Date   WBC 6.1 11/16/2015   HGB 14.4 11/16/2015   HCT 91 (A) 11/16/2015   PLT 258 11/16/2015   GLUCOSE 100 (H) 07/29/2012   CHOL 169 11/16/2015   TRIG 178 (A) 11/16/2015   HDL 48 11/16/2015   LDLCALC 85 11/16/2015   ALT 23 11/16/2015   AST 19 11/16/2015   NA 139 11/16/2015   K 4.6 11/16/2015   CL 107 07/29/2012   CREATININE 0.8 11/16/2015   BUN 16 11/16/2015   CO2 26 07/29/2012    Assessment & Plan:   Chasty was seen today for hypertension and hyperlipidemia.  Diagnoses and all orders for this visit:  Essential hypertension- her blood pressure is  well-controlled, recent electrolytes and renal function are stable. -     Cancel: Comprehensive metabolic panel; Future -     Cancel: CBC with Differential/Platelet; Future  Dyslipidemia, goal LDL below 160- she has achieved her LDL goal is doing well on the statin. -     Cancel: Lipid panel; Future -     Cancel: TSH; Future  Pure hyperglyceridemia- her triglycerides are mildly elevated and do not need to be treated with a medication, she agrees to decrease her intake of fats and sugars. -     Cancel: Lipid panel; Future  Hyperglycemia- improvement noted on recent labs -     Cancel: Comprehensive metabolic panel; Future -     Cancel: Hemoglobin A1c; Future   I have discontinued Ms. Blando's DiphenhydrAMINE HCl (BENADRYL ALLERGY PO), Co Q 10, ibuprofen, and ASPERCREME W/LIDOCAINE EX. I am also having her maintain her aspirin, vitamin E, Menaquinone-7 (VITAMIN K2 PO), Vitamin D3, valsartan, potassium chloride  SA, furosemide, Pitavastatin Calcium, and loratadine.  No orders of the defined types were placed in this encounter.    Follow-up: No Follow-up on file.  Scarlette Calico, MD

## 2015-12-27 NOTE — Telephone Encounter (Signed)
Called patient to advise. Left message for her to call back for test results

## 2015-12-28 NOTE — Telephone Encounter (Signed)
LMOM for patient to call back and schedule follow up appointment.  °

## 2015-12-28 NOTE — Telephone Encounter (Signed)
Spoke to patient to advise

## 2015-12-30 NOTE — Patient Instructions (Signed)
Hypertension Hypertension, commonly called high blood pressure, is when the force of blood pumping through your arteries is too strong. Your arteries are the blood vessels that carry blood from your heart throughout your body. A blood pressure reading consists of a higher number over a lower number, such as 110/72. The higher number (systolic) is the pressure inside your arteries when your heart pumps. The lower number (diastolic) is the pressure inside your arteries when your heart relaxes. Ideally you want your blood pressure below 120/80. Hypertension forces your heart to work harder to pump blood. Your arteries may become narrow or stiff. Having untreated or uncontrolled hypertension can cause heart attack, stroke, kidney disease, and other problems. RISK FACTORS Some risk factors for high blood pressure are controllable. Others are not.  Risk factors you cannot control include:   Race. You may be at higher risk if you are African American.  Age. Risk increases with age.  Gender. Men are at higher risk than women before age 45 years. After age 65, women are at higher risk than men. Risk factors you can control include:  Not getting enough exercise or physical activity.  Being overweight.  Getting too much fat, sugar, calories, or salt in your diet.  Drinking too much alcohol. SIGNS AND SYMPTOMS Hypertension does not usually cause signs or symptoms. Extremely high blood pressure (hypertensive crisis) may cause headache, anxiety, shortness of breath, and nosebleed. DIAGNOSIS To check if you have hypertension, your health care provider will measure your blood pressure while you are seated, with your arm held at the level of your heart. It should be measured at least twice using the same arm. Certain conditions can cause a difference in blood pressure between your right and left arms. A blood pressure reading that is higher than normal on one occasion does not mean that you need treatment. If  it is not clear whether you have high blood pressure, you may be asked to return on a different day to have your blood pressure checked again. Or, you may be asked to monitor your blood pressure at home for 1 or more weeks. TREATMENT Treating high blood pressure includes making lifestyle changes and possibly taking medicine. Living a healthy lifestyle can help lower high blood pressure. You may need to change some of your habits. Lifestyle changes may include:  Following the DASH diet. This diet is high in fruits, vegetables, and whole grains. It is low in salt, red meat, and added sugars.  Keep your sodium intake below 2,300 mg per day.  Getting at least 30-45 minutes of aerobic exercise at least 4 times per week.  Losing weight if necessary.  Not smoking.  Limiting alcoholic beverages.  Learning ways to reduce stress. Your health care provider may prescribe medicine if lifestyle changes are not enough to get your blood pressure under control, and if one of the following is true:  You are 18-59 years of age and your systolic blood pressure is above 140.  You are 60 years of age or older, and your systolic blood pressure is above 150.  Your diastolic blood pressure is above 90.  You have diabetes, and your systolic blood pressure is over 140 or your diastolic blood pressure is over 90.  You have kidney disease and your blood pressure is above 140/90.  You have heart disease and your blood pressure is above 140/90. Your personal target blood pressure may vary depending on your medical conditions, your age, and other factors. HOME CARE INSTRUCTIONS    Have your blood pressure rechecked as directed by your health care provider.   Take medicines only as directed by your health care provider. Follow the directions carefully. Blood pressure medicines must be taken as prescribed. The medicine does not work as well when you skip doses. Skipping doses also puts you at risk for  problems.  Do not smoke.   Monitor your blood pressure at home as directed by your health care provider. SEEK MEDICAL CARE IF:   You think you are having a reaction to medicines taken.  You have recurrent headaches or feel dizzy.  You have swelling in your ankles.  You have trouble with your vision. SEEK IMMEDIATE MEDICAL CARE IF:  You develop a severe headache or confusion.  You have unusual weakness, numbness, or feel faint.  You have severe chest or abdominal pain.  You vomit repeatedly.  You have trouble breathing. MAKE SURE YOU:   Understand these instructions.  Will watch your condition.  Will get help right away if you are not doing well or get worse.   This information is not intended to replace advice given to you by your health care provider. Make sure you discuss any questions you have with your health care provider.   Document Released: 02/03/2005 Document Revised: 06/20/2014 Document Reviewed: 11/26/2012 Elsevier Interactive Patient Education 2016 Elsevier Inc.  

## 2016-01-15 ENCOUNTER — Other Ambulatory Visit: Payer: Self-pay | Admitting: Internal Medicine

## 2016-01-15 ENCOUNTER — Telehealth: Payer: Self-pay | Admitting: Rheumatology

## 2016-01-15 MED ORDER — AMOXICILLIN-POT CLAVULANATE 875-125 MG PO TABS: 1.0000 | ORAL_TABLET | Freq: Two times a day (BID) | ORAL | 0 refills | Status: AC

## 2016-01-15 NOTE — Telephone Encounter (Signed)
She should discuss Calcium with her PCP, advised her if PCP tells her it is okay then she should use 1200mg  a day, but it is best to get her calcium through her diet. She has voiced understanding

## 2016-01-15 NOTE — Telephone Encounter (Signed)
Patient is taking Vitamin D and wants to know if she can take a calcium supplement.

## 2016-01-21 ENCOUNTER — Other Ambulatory Visit: Payer: Self-pay | Admitting: Obstetrics & Gynecology

## 2016-01-21 DIAGNOSIS — Z124 Encounter for screening for malignant neoplasm of cervix: Secondary | ICD-10-CM | POA: Diagnosis not present

## 2016-01-21 LAB — HM PAP SMEAR

## 2016-01-22 LAB — CYTOLOGY - PAP

## 2016-01-24 ENCOUNTER — Ambulatory Visit: Payer: Medicare Other | Admitting: Rheumatology

## 2016-02-01 ENCOUNTER — Encounter: Payer: Self-pay | Admitting: Internal Medicine

## 2016-02-01 NOTE — Progress Notes (Signed)
Esmond Plants OBGyn sent exam notes.

## 2016-05-14 ENCOUNTER — Telehealth: Payer: Self-pay | Admitting: Internal Medicine

## 2016-05-14 NOTE — Telephone Encounter (Signed)
Called patient to schedule awv. Patient's phone is was busy, will try to call patient at a later time.

## 2016-05-27 ENCOUNTER — Ambulatory Visit: Payer: Medicare Other | Admitting: Rheumatology

## 2016-07-02 DIAGNOSIS — M2242 Chondromalacia patellae, left knee: Secondary | ICD-10-CM | POA: Insufficient documentation

## 2016-07-02 DIAGNOSIS — M170 Bilateral primary osteoarthritis of knee: Secondary | ICD-10-CM | POA: Insufficient documentation

## 2016-07-02 DIAGNOSIS — M19042 Primary osteoarthritis, left hand: Secondary | ICD-10-CM

## 2016-07-02 DIAGNOSIS — E559 Vitamin D deficiency, unspecified: Secondary | ICD-10-CM | POA: Insufficient documentation

## 2016-07-02 DIAGNOSIS — I7300 Raynaud's syndrome without gangrene: Secondary | ICD-10-CM | POA: Insufficient documentation

## 2016-07-02 DIAGNOSIS — M8589 Other specified disorders of bone density and structure, multiple sites: Secondary | ICD-10-CM | POA: Insufficient documentation

## 2016-07-02 DIAGNOSIS — M19041 Primary osteoarthritis, right hand: Secondary | ICD-10-CM | POA: Insufficient documentation

## 2016-07-02 DIAGNOSIS — M19071 Primary osteoarthritis, right ankle and foot: Secondary | ICD-10-CM | POA: Insufficient documentation

## 2016-07-02 DIAGNOSIS — M19072 Primary osteoarthritis, left ankle and foot: Secondary | ICD-10-CM

## 2016-07-02 DIAGNOSIS — M2241 Chondromalacia patellae, right knee: Secondary | ICD-10-CM | POA: Insufficient documentation

## 2016-07-02 DIAGNOSIS — Z8719 Personal history of other diseases of the digestive system: Secondary | ICD-10-CM | POA: Insufficient documentation

## 2016-07-02 NOTE — Progress Notes (Signed)
Office Visit Note  Patient: Savannah [name]             Date of Birth: [birth year]           MRN: [id number]             PCP: Janith Lima, MD Referring: Janith Lima, MD Visit Date: 07/10/2016 Occupation: @GUAROCC @    Subjective:  Knee pain.   History of Present Illness: Savannah [name] is a 71 y.o. female with history of the scleroderma interstitial lung disease and osteoarthritis. She continues to have some discomfort in her knee joints and her left SI joint. She continues to have some discomfort in her feet due to underlying neuropathy. She feels more tightness in her hands.  Activities of Daily Living:  Patient reports morning stiffness for 10 minutes.   Patient Denies nocturnal pain.  Difficulty dressing/grooming: Denies Difficulty climbing stairs: Reports Difficulty getting out of chair: Reports Difficulty using hands for taps, buttons, cutlery, and/or writing: Reports   Review of Systems  Constitutional: Positive for fatigue. Negative for night sweats, weight gain, weight loss and weakness.  HENT: Negative for mouth sores, trouble swallowing, trouble swallowing, mouth dryness and nose dryness.   Eyes: Negative for pain, redness, visual disturbance and dryness.  Respiratory: Positive for shortness of breath. Negative for cough and difficulty breathing.   Cardiovascular: Negative for chest pain, palpitations, hypertension, irregular heartbeat and swelling in legs/feet.  Gastrointestinal: Negative for blood in stool, constipation and diarrhea.  Endocrine: Negative for increased urination.  Genitourinary: Negative for vaginal dryness.  Musculoskeletal: Positive for arthralgias, joint pain and morning stiffness. Negative for joint swelling, myalgias, muscle weakness, muscle tenderness and myalgias.  Skin: Positive for skin tightness. Negative for color change, rash, hair loss, ulcers and sensitivity to sunlight.  Allergic/Immunologic: Negative for susceptible to infections.    Neurological: Negative for dizziness, memory loss and night sweats.  Hematological: Negative for swollen glands.  Psychiatric/Behavioral: Negative for depressed mood and sleep disturbance. The patient is not nervous/anxious.     PMFS History:  Patient Active Problem List   Diagnosis Date Noted  . Raynaud's syndrome without gangrene 07/02/2016  . Osteopenia of multiple sites 07/02/2016  . Vitamin D deficiency 07/02/2016  . Primary osteoarthritis of both knees 07/02/2016  . Chondromalacia patellae, left knee 07/02/2016  . Chondromalacia patellae, right knee 07/02/2016  . Primary osteoarthritis of both hands 07/02/2016  . Primary osteoarthritis of both feet 07/02/2016  . History of diverticulosis 07/02/2016  . Pure hyperglyceridemia 12/27/2015  . Hyperglycemia 12/27/2015  . Dyspnea and respiratory abnormality 12/10/2015  . ILD (interstitial lung disease) ([place]) 12/10/2015  . Physical deconditioning 12/10/2015  . Diastolic dysfunction 62/70/3500  . Edema of lower extremity 08/18/2014  . HTN (hypertension) 10/07/2011  . Dyslipidemia, goal LDL below 160 10/07/2011  . Scleroderma ([place]) 10/07/2011  . Varicose veins 10/07/2011    Past Medical History:  Diagnosis Date  . Dyspnea    MYOVIEW, 12/29/2006 - post-stress EF 81%,no ECG changes, EKG negative for ischemia  . Hyperlipidemia   . Hypertension    2D ECHO, 12/24/2006 - EF 45-55%, normal  . Swelling of extremity    LEA VENOUS DUPLEX, 08/19/2011 - deep valvular insufficiency noted with right femoral and popliteal veins and left common femoral vein  . TIA (transient ischemic attack)    CAROTID DOPPLER, 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    Family History  Problem Relation Age of Onset  . Hyperlipidemia Father   .  Cirrhosis Father 17  . Kidney failure Father 71  . Stroke Maternal Grandmother 85  . Hypertension Maternal Grandmother 40  . Angina Maternal Grandfather 53  . Heart attack Maternal  Grandfather 88  . Lung disease Paternal Grandmother   . Atrial fibrillation Mother 52  . Hypertension Mother    History reviewed. No pertinent surgical history. Social History   Social History Narrative  . No narrative on file     Objective: Vital Signs: BP 126/62   Pulse 72   Resp 16   Ht 5\' 7"  (1.702 m)   Wt 192 lb (87.1 kg)   BMI 30.07 kg/m    Physical Exam  Constitutional: She is oriented to person, place, and time. She appears well-developed and well-nourished.  HENT:  Head: Normocephalic and atraumatic.  Eyes: Conjunctivae and EOM are normal.  Neck: Normal range of motion.  Cardiovascular: Normal rate, regular rhythm, normal heart sounds and intact distal pulses.   Pulmonary/Chest: Effort normal and breath sounds normal.  Abdominal: Soft. Bowel sounds are normal.  Lymphadenopathy:    She has no cervical adenopathy.  Neurological: She is alert and oriented to person, place, and time.  Skin: Skin is warm and dry. Capillary refill takes less than 2 seconds.  sclerodactyly  Psychiatric: She has a normal mood and affect. Her behavior is normal.  Nursing note and vitals reviewed.    Musculoskeletal Exam: C-spine and thoracic lumbar spine good range of motion. Shoulder joints elbow joints wrist joints are good range of motion. 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 Hip joints knee joints ankles MTPs PIPs with good range of motion. She is some discomfort range of motion of her knee joint but no warmth swelling or effusion was noted.  CDAI Exam: No CDAI exam completed.    Investigation: Findings:  11/14/2015 X-rays of bilateral knee joints, 3 views today, showed right knee joint severe medial compartment narrowing with medial and lateral osteophytes and severe patellofemoral narrowing consistent with severe osteoarthritis and chondromalacia patella.  There was no chondrocalcinosis.  Left knee joint showed moderate medial  compartment narrowing and severe patellofemoral narrowing consistent with moderate osteoarthritis.  No chondrocalcinosis.   Her labs done from November 2009 showed ANA was negative.  PAN-ANCA, hepatitis panel, uric acid, CK, ACE level, anti-CCP antibody, and ENA were all within normal limits.  Vitamin D was low at 22.     notes from Dr. Lorie Apley office in January of 2011 that showed diverticulosis and repeat colonoscopy recommended in five years.  I also reviewed Dr. Ronnald Ramp' notes which showed biopsy consistent with scleroderma and they suggested checking SPEP which we will review in her chart.  From [facility] in December of 2010, I reviewed MRI and documented echo within the last year.  PFT's at 90% DLCO, FEV at 77%.  Chest x-ray and x-rays of hands and feet were done.  I also reviewed from our records CT of abdomen and pelvis which were normal.     CBC Latest Ref Rng & Units 11/16/2015 12/15/2006 12/15/2006  WBC 10:3/mL 6.1 - 6.0  Hemoglobin 12.0 - 16.0 g/dL 14.4 13.9 13.8  Hematocrit 36 - 46 % 91(A) 41.0 39.8  Platelets 150 - 399 K/L 258 - 301   CMP Latest Ref Rng & Units 11/16/2015 07/29/2012 12/15/2006  Glucose 70 - 99 mg/dL - 100(H) 92  BUN 4 - 21 mg/dL 16 17 14   Creatinine 0.5 - 1.1 mg/dL 0.8 0.88 0.9  Sodium 137 -  147 mmol/L 139 143 139  Potassium 3.4 - 5.3 mmol/L 4.6 4.6 3.9  Chloride 96 - 112 mEq/L - 107 106  CO2 19 - 32 mEq/L - 26 -  Calcium 8.4 - 10.5 mg/dL - 9.5 -  Total Protein 6.0 - 8.3 g/dL - 6.7 -  Total Bilirubin 0.3 - 1.2 mg/dL - 0.9 -  Alkaline Phos 25 - 125 U/L 53 55 -  AST 13 - 35 U/L 19 20 -  ALT 7 - 35 U/L 23 26 -   Imaging: No results found.  Speciality Comments: No specialty comments available.    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 She has limited systemic sclerosis with the involvement of the skin only distal to the wrist joint. She also has some lower extremity involvement. She didn't dizziness.   ILD (interstitial lung disease) ([place]): Followed up by Dr. Chase Caller  Raynaud's syndrome without gangrene: is not active currently.  Primary osteoarthritis of both hands: Joint protection and muscle strengthening discussed.  Primary osteoarthritis of both knees -  with chondromalacia patella. She's been having increased pain in her knee joints. I would avoid cortisone injection as she does have scleroderma. She has done well in the past on viscous supplement injections I would try to apply for Euflexxa injections to her right knee joint.  Primary osteoarthritis of both feet: Proper fitting shoes were discussed.  Osteopenia of multiple sites calcium and vitamin D and resistive exercises were discussed.  Vitamin D deficiency  Physical deconditioning  History of hypertension  History of hyperlipidemia  History of diverticulosis    Orders: Orders Placed This Encounter  Procedures  . CBC with Differential/Platelet  . COMPLETE METABOLIC PANEL WITH GFR  . Urinalysis, Routine w reflex microscopic   No orders of the defined types were placed in this encounter.   Face-to-face time spent with patient was 30 minutes. 50% of time was spent in counseling and coordination of care.  Follow-Up Instructions: Return in about 6 months (around 01/10/2017) for Scleroderma, osteoarthritis.   Bo Merino, MD  Note - This record has been created using Editor, commissioning.  Chart creation errors have been sought, but may not always  have been located. Such creation errors do not reflect on  the standard of medical care.

## 2016-07-10 ENCOUNTER — Ambulatory Visit (INDEPENDENT_AMBULATORY_CARE_PROVIDER_SITE_OTHER): Payer: Medicare Other | Admitting: Rheumatology

## 2016-07-10 ENCOUNTER — Encounter: Payer: Self-pay | Admitting: Rheumatology

## 2016-07-10 VITALS — BP 126/62 | HR 72 | Resp 16 | Ht 67.0 in | Wt 192.0 lb

## 2016-07-10 DIAGNOSIS — M170 Bilateral primary osteoarthritis of knee: Secondary | ICD-10-CM

## 2016-07-10 DIAGNOSIS — Z8639 Personal history of other endocrine, nutritional and metabolic disease: Secondary | ICD-10-CM

## 2016-07-10 DIAGNOSIS — E559 Vitamin D deficiency, unspecified: Secondary | ICD-10-CM | POA: Diagnosis not present

## 2016-07-10 DIAGNOSIS — I7300 Raynaud's syndrome without gangrene: Secondary | ICD-10-CM

## 2016-07-10 DIAGNOSIS — R5381 Other malaise: Secondary | ICD-10-CM

## 2016-07-10 DIAGNOSIS — M19072 Primary osteoarthritis, left ankle and foot: Secondary | ICD-10-CM

## 2016-07-10 DIAGNOSIS — M19042 Primary osteoarthritis, left hand: Secondary | ICD-10-CM

## 2016-07-10 DIAGNOSIS — Z8679 Personal history of other diseases of the circulatory system: Secondary | ICD-10-CM | POA: Diagnosis not present

## 2016-07-10 DIAGNOSIS — M19071 Primary osteoarthritis, right ankle and foot: Secondary | ICD-10-CM

## 2016-07-10 DIAGNOSIS — M8589 Other specified disorders of bone density and structure, multiple sites: Secondary | ICD-10-CM

## 2016-07-10 DIAGNOSIS — J849 Interstitial pulmonary disease, unspecified: Secondary | ICD-10-CM

## 2016-07-10 DIAGNOSIS — M349 Systemic sclerosis, unspecified: Secondary | ICD-10-CM

## 2016-07-10 DIAGNOSIS — Z8719 Personal history of other diseases of the digestive system: Secondary | ICD-10-CM | POA: Diagnosis not present

## 2016-07-10 DIAGNOSIS — M19041 Primary osteoarthritis, right hand: Secondary | ICD-10-CM | POA: Diagnosis not present

## 2016-07-10 LAB — CBC WITH DIFFERENTIAL/PLATELET
BASOS ABS: 71 {cells}/uL (ref 0–200)
Basophils Relative: 1 %
EOS ABS: 142 {cells}/uL (ref 15–500)
Eosinophils Relative: 2 %
HEMATOCRIT: 42.6 % (ref 35.0–45.0)
Hemoglobin: 14.4 g/dL (ref 11.7–15.5)
LYMPHS PCT: 32 %
Lymphs Abs: 2272 cells/uL (ref 850–3900)
MCH: 30.8 pg (ref 27.0–33.0)
MCHC: 33.8 g/dL (ref 32.0–36.0)
MCV: 91 fL (ref 80.0–100.0)
MONO ABS: 568 {cells}/uL (ref 200–950)
MONOS PCT: 8 %
MPV: 10.4 fL (ref 7.5–12.5)
NEUTROS PCT: 57 %
Neutro Abs: 4047 cells/uL (ref 1500–7800)
PLATELETS: 284 10*3/uL (ref 140–400)
RBC: 4.68 MIL/uL (ref 3.80–5.10)
RDW: 13.6 % (ref 11.0–15.0)
WBC: 7.1 10*3/uL (ref 3.8–10.8)

## 2016-07-10 LAB — COMPLETE METABOLIC PANEL WITH GFR
ALT: 26 U/L (ref 6–29)
AST: 20 U/L (ref 10–35)
Albumin: 4.5 g/dL (ref 3.6–5.1)
Alkaline Phosphatase: 60 U/L (ref 33–130)
BUN: 16 mg/dL (ref 7–25)
CALCIUM: 10 mg/dL (ref 8.6–10.4)
CHLORIDE: 104 mmol/L (ref 98–110)
CO2: 26 mmol/L (ref 20–31)
CREATININE: 0.9 mg/dL (ref 0.60–0.93)
GFR, EST AFRICAN AMERICAN: 74 mL/min (ref >=60)
GFR, EST NON AFRICAN AMERICAN: 65 mL/min (ref >=60)
Glucose, Bld: 67 mg/dL (ref 65–99)
Potassium: 4.2 mmol/L (ref 3.5–5.3)
Sodium: 140 mmol/L (ref 135–146)
Total Bilirubin: 1 mg/dL (ref 0.2–1.2)
Total Protein: 6.7 g/dL (ref 6.1–8.1)

## 2016-07-11 ENCOUNTER — Telehealth: Payer: Self-pay | Admitting: Radiology

## 2016-07-11 LAB — URINALYSIS, ROUTINE W REFLEX MICROSCOPIC
Bilirubin Urine: NEGATIVE
Glucose, UA: NEGATIVE
HGB URINE DIPSTICK: NEGATIVE
KETONES UR: NEGATIVE
LEUKOCYTES UA: NEGATIVE
NITRITE: NEGATIVE
PH: 5.5 (ref 5.0–8.0)
Protein, ur: NEGATIVE
SPECIFIC GRAVITY, URINE: 1.02 (ref 1.001–1.035)

## 2016-07-11 NOTE — Telephone Encounter (Signed)
We need to apply for Visco for patients right knee please Euflexxa preferred if this is acceptable with insurance.   Does this go to you or to April ?

## 2016-07-11 NOTE — Progress Notes (Signed)
wnl

## 2016-07-17 DIAGNOSIS — H35372 Puckering of macula, left eye: Secondary | ICD-10-CM | POA: Diagnosis not present

## 2016-07-17 DIAGNOSIS — H2513 Age-related nuclear cataract, bilateral: Secondary | ICD-10-CM | POA: Diagnosis not present

## 2016-07-17 DIAGNOSIS — Z8673 Personal history of transient ischemic attack (TIA), and cerebral infarction without residual deficits: Secondary | ICD-10-CM | POA: Diagnosis not present

## 2016-07-17 DIAGNOSIS — H16143 Punctate keratitis, bilateral: Secondary | ICD-10-CM | POA: Diagnosis not present

## 2016-07-29 ENCOUNTER — Ambulatory Visit (INDEPENDENT_AMBULATORY_CARE_PROVIDER_SITE_OTHER): Payer: Medicare Other | Admitting: Internal Medicine

## 2016-07-29 ENCOUNTER — Encounter: Payer: Self-pay | Admitting: Internal Medicine

## 2016-07-29 VITALS — BP 124/68 | HR 61 | Temp 98.8°F | Resp 16 | Ht 67.0 in | Wt 192.0 lb

## 2016-07-29 DIAGNOSIS — I10 Essential (primary) hypertension: Secondary | ICD-10-CM | POA: Diagnosis not present

## 2016-07-29 DIAGNOSIS — Z23 Encounter for immunization: Secondary | ICD-10-CM | POA: Diagnosis not present

## 2016-07-29 MED ORDER — ZOSTER VAC RECOMB ADJUVANTED 50 MCG/0.5ML IM SUSR: 0.5000 mL | Freq: Once | INTRAMUSCULAR | 1 refills | Status: AC

## 2016-07-29 NOTE — Patient Instructions (Signed)

## 2016-07-29 NOTE — Progress Notes (Signed)
Subjective:  Patient ID: Savannah [name], female    DOB: 1945-05-23  Age: 71 y.o. MRN: [id number]  CC: Hypertension   HPI [name] presents for a BP check - she feels well and offers no complaints.  Outpatient Medications Prior to Visit  Medication Sig Dispense Refill  . aspirin 81 MG tablet Take 81 mg by mouth.    . Cholecalciferol (VITAMIN D3) 50000 units TABS Take 50,000 Units by mouth once a week.    . Coenzyme Q10 (CO Q 10) 100 MG CAPS Take 200 mg by mouth.    . furosemide (LASIX) 20 MG tablet Take 1 tablet (20 mg total) by mouth daily as needed for fluid or edema. 90 tablet 3  . loratadine (CLARITIN) 10 MG tablet Take 10 mg by mouth daily as needed for allergies.    . Menaquinone-7 (VITAMIN K2 PO) Take 50 mcg by mouth once a week.    . Pitavastatin Calcium (LIVALO) 2 MG TABS Take 1 tablet (2 mg total) by mouth daily. 90 tablet 0  . potassium chloride SA (KLOR-CON M20) 20 MEQ tablet Take 1 tablet (20 mEq total) by mouth daily as needed. When you take Furosemide (Lasix). 90 tablet 3  . valsartan (DIOVAN) 80 MG tablet Take 1 tablet (80 mg total) by mouth daily. 90 tablet 3  . vitamin E 400 UNIT capsule Take 400 Units by mouth daily.     No facility-administered medications prior to visit.     ROS Review of Systems  Constitutional: Negative.  Negative for appetite change, chills, diaphoresis and fatigue.  HENT: Negative.  Negative for trouble swallowing.   Eyes: Negative for visual disturbance.  Respiratory: Negative for cough, chest tightness, shortness of breath and wheezing.   Cardiovascular: Negative for chest pain, palpitations and leg swelling.  Gastrointestinal: Negative for abdominal pain, blood in stool, constipation, diarrhea, nausea and vomiting.  Endocrine: Negative.   Genitourinary: Negative.  Negative for decreased urine volume and difficulty urinating.  Musculoskeletal: Negative.  Negative for back pain and myalgias.  Skin: Negative.   Allergic/Immunologic:  Negative.   Neurological: Negative.  Negative for dizziness, weakness and light-headedness.  Hematological: Negative for adenopathy. Does not bruise/bleed easily.  Psychiatric/Behavioral: Negative.     Objective:  BP 124/68 (BP Location: Left Arm, Patient Position: Sitting, Cuff Size: Large)   Pulse 61   Temp 98.8 F (37.1 C) (Oral)   Resp 16   Ht 5\' 7"  (1.702 m)   Wt 192 lb (87.1 kg)   SpO2 98%   BMI 30.07 kg/m   BP Readings from Last 3 Encounters:  07/29/16 124/68  07/10/16 126/62  12/27/15 128/80    Wt Readings from Last 3 Encounters:  07/29/16 192 lb (87.1 kg)  07/10/16 192 lb (87.1 kg)  12/27/15 185 lb 12 oz (84.3 kg)    Physical Exam  Constitutional: She is oriented to person, place, and time. No distress.  HENT:  Mouth/Throat: Oropharynx is clear and moist. No oropharyngeal exudate.  Eyes: Conjunctivae are normal. Right eye exhibits no discharge. Left eye exhibits no discharge. No scleral icterus.  Neck: Normal range of motion. Neck supple. No JVD present. No thyromegaly present.  Cardiovascular: Normal rate and intact distal pulses.  Exam reveals no gallop and no friction rub.   No murmur heard. Pulmonary/Chest: Effort normal and breath sounds normal. No respiratory distress. She has no wheezes. She has no rales. She exhibits no tenderness.  Abdominal: Soft. Bowel sounds are normal. She exhibits no distension  and no mass. There is no tenderness. There is no rebound and no guarding.  Musculoskeletal: Normal range of motion. She exhibits no edema or tenderness.  Lymphadenopathy:    She has no cervical adenopathy.  Neurological: She is alert and oriented to person, place, and time.  Skin: Skin is warm and dry. No rash noted. She is not diaphoretic. No erythema.  Vitals reviewed.   Lab Results  Component Value Date   WBC 7.1 07/10/2016   HGB 14.4 07/10/2016   HCT 42.6 07/10/2016   PLT 284 07/10/2016   GLUCOSE 67 07/10/2016   CHOL 169 11/16/2015   TRIG 178  (A) 11/16/2015   HDL 48 11/16/2015   LDLCALC 85 11/16/2015   ALT 26 07/10/2016   AST 20 07/10/2016   NA 140 07/10/2016   K 4.2 07/10/2016   CL 104 07/10/2016   CREATININE 0.90 07/10/2016   BUN 16 07/10/2016   CO2 26 07/10/2016    Mm Screening Breast Tomo Bilateral  Result Date: 12/11/2015 CLINICAL DATA:  Screening. EXAM: 2D DIGITAL SCREENING BILATERAL MAMMOGRAM WITH CAD AND ADJUNCT TOMO COMPARISON:  Previous exam(s). ACR Breast Density Category b: There are scattered areas of fibroglandular density. FINDINGS: There are no findings suspicious for malignancy. Images were processed with CAD. IMPRESSION: No mammographic evidence of malignancy. A result letter of this screening mammogram will be mailed directly to the patient. RECOMMENDATION: Screening mammogram in one year. (Code:SM-B-01Y) BI-RADS CATEGORY  1: Negative. Electronically Signed   By: Margarette Canada M.D.   On: 12/11/2015 16:57    Assessment & Plan:   Savannah [name] was seen today for hypertension.  Diagnoses and all orders for this visit:  Essential hypertension- Her blood pressure is well-controlled, recent electrolytes and renal function are normal. Will continue the ARB and loop diuretic.  Need for Tdap vaccination -     Tdap vaccine greater than or equal to 7yo IM  Other orders -     Zoster Vac Recomb Adjuvanted [facility]) injection; Inject 0.5 mLs into the muscle once.   I am having Ms. Bangert start on Zoster Vac Recomb Adjuvanted. I am also having her maintain her aspirin, vitamin E, Menaquinone-7 (VITAMIN K2 PO), Vitamin D3, valsartan, potassium chloride SA, furosemide, Pitavastatin Calcium, loratadine, and Co Q 10.  Meds ordered this encounter  Medications  . Zoster Vac Recomb Adjuvanted [facility]) injection    Sig: Inject 0.5 mLs into the muscle once.    Dispense:  1 each    Refill:  1     Follow-up: Return in about 6 months (around 01/28/2017).  Savannah Calico, MD

## 2016-08-01 ENCOUNTER — Telehealth: Payer: Self-pay

## 2016-08-01 NOTE — Telephone Encounter (Signed)
I did not get to this one, can you try to do next week?

## 2016-08-01 NOTE — Telephone Encounter (Signed)
Not yet

## 2016-08-01 NOTE — Telephone Encounter (Signed)
Patient had called to check the status.  Just checking with you. :)

## 2016-08-01 NOTE — Telephone Encounter (Signed)
Hey did you start the process for Euflexxa Injection for this patient.  I see message was sent to you from Amy L.

## 2016-08-04 ENCOUNTER — Ambulatory Visit: Payer: Medicare Other | Admitting: Internal Medicine

## 2016-08-04 DIAGNOSIS — J849 Interstitial pulmonary disease, unspecified: Secondary | ICD-10-CM

## 2016-08-04 DIAGNOSIS — R0689 Other abnormalities of breathing: Principal | ICD-10-CM

## 2016-08-04 DIAGNOSIS — R0600 Dyspnea, unspecified: Secondary | ICD-10-CM

## 2016-08-04 LAB — PULMONARY FUNCTION TEST
DL/VA % PRED: 66 %
DL/VA: 3.43 ml/min/mmHg/L
DLCO COR: 16.84 ml/min/mmHg
DLCO UNC % PRED: 59 %
DLCO UNC: 16.89 ml/min/mmHg
DLCO cor % pred: 59 %
FEF 25-75 PRE: 2.32 L/s
FEF 25-75 Post: 2.06 L/sec
FEF2575-%CHANGE-POST: -11 %
FEF2575-%PRED-PRE: 116 %
FEF2575-%Pred-Post: 103 %
FEV1-%Change-Post: -1 %
FEV1-%PRED-POST: 100 %
FEV1-%PRED-PRE: 101 %
FEV1-POST: 2.5 L
FEV1-PRE: 2.55 L
FEV1FVC-%CHANGE-POST: 5 %
FEV1FVC-%Pred-Pre: 103 %
FEV6-%CHANGE-POST: -6 %
FEV6-%PRED-POST: 95 %
FEV6-%PRED-PRE: 102 %
FEV6-Post: 3.03 L
FEV6-Pre: 3.24 L
FEV6FVC-%CHANGE-POST: 0 %
FEV6FVC-%Pred-Post: 104 %
FEV6FVC-%Pred-Pre: 103 %
FVC-%Change-Post: -6 %
FVC-%Pred-Post: 91 %
FVC-%Pred-Pre: 98 %
FVC-Post: 3.03 L
FVC-Pre: 3.24 L
POST FEV1/FVC RATIO: 83 %
POST FEV6/FVC RATIO: 100 %
PRE FEV1/FVC RATIO: 79 %
Pre FEV6/FVC Ratio: 100 %
RV % PRED: 76 %
RV: 1.81 L
TLC % pred: 92 %
TLC: 5.11 L

## 2016-08-04 NOTE — Progress Notes (Signed)
PFT completed today 08/04/16.  

## 2016-08-18 ENCOUNTER — Encounter: Payer: Self-pay | Admitting: Internal Medicine

## 2016-08-18 ENCOUNTER — Encounter: Payer: Self-pay | Admitting: Rheumatology

## 2016-08-18 ENCOUNTER — Telehealth: Payer: Self-pay | Admitting: Rheumatology

## 2016-08-18 NOTE — Telephone Encounter (Signed)
Patient called checking the status of her injections.

## 2016-08-18 NOTE — Telephone Encounter (Signed)
MR please advise on pt's PFT results. Thanks.

## 2016-08-25 NOTE — Telephone Encounter (Signed)
Applied for Euflexxa, right, pending benefits

## 2016-08-28 ENCOUNTER — Ambulatory Visit: Payer: Medicare Other

## 2016-09-08 ENCOUNTER — Other Ambulatory Visit: Payer: Self-pay

## 2016-09-08 ENCOUNTER — Telehealth (HOSPITAL_COMMUNITY): Payer: Self-pay | Admitting: *Deleted

## 2016-09-08 MED ORDER — LOSARTAN POTASSIUM 50 MG PO TABS: 50.0000 mg | ORAL_TABLET | Freq: Every day | ORAL | 3 refills | Status: DC

## 2016-09-08 NOTE — Telephone Encounter (Signed)
[facility] pharmacy called stating that there has been a recall on Valsartan.  Spoke with Barrington Ellison, PA and he advises patient to switch to losartan 50 mg Daily.  Verbal order given to pharmacy and medication list updated.  I tried calling patient to let her know the plan but had to leave message asking for her to call us back.

## 2016-09-08 NOTE — Telephone Encounter (Signed)
Patient called back and she is aware and agreeable to switch to losartan.  No further questions.

## 2016-09-09 NOTE — Telephone Encounter (Signed)
Faxed Euflexxa application to [phone]. 

## 2016-09-16 NOTE — Telephone Encounter (Signed)
Results for Savannah [name], Savannah [name] (MRN [id number]) as of 09/16/2016 16:49  Ref. Range 08/08/2014 09:58 11/09/2015 10:48 08/04/2016 10:57  DLCO unc Latest Units: ml/min/mmHg 18.83 17.03 16.89  DLCO unc % pred Latest Units: % 66 60 59    There might be slow decline  In lung function but is also  Dependent on how she feels. Wil see her in august 2018 and discuss  Dr. Brand Males, M.D., [facility].C.P Pulmonary and Critical Care Medicine Staff Physician [place] [facility] Pager: [phone], If no answer or between  15:00h - 7:00h: call [phone]  09/16/2016 4:50 PM

## 2016-09-26 ENCOUNTER — Telehealth (HOSPITAL_COMMUNITY): Payer: Self-pay | Admitting: *Deleted

## 2016-09-26 DIAGNOSIS — I4891 Unspecified atrial fibrillation: Secondary | ICD-10-CM

## 2016-09-26 MED ORDER — LOSARTAN POTASSIUM 100 MG PO TABS: 100.0000 mg | ORAL_TABLET | Freq: Every day | ORAL | 3 refills | Status: DC

## 2016-09-26 NOTE — Telephone Encounter (Signed)
Patient called in reporting that since she was switched from Valsartan to Losartan 50 mg her BPs have been running in the 140's for two weeks now.    I spoke with Barrington Ellison, PA and he advises patient to increase Losartan to 100 mg Daily at bedtime, BMET next week, and follow up with Dr. Haroldine Laws in the next couple of months.  Patient is aware and agreeable with plan.  Medication updated, bmet ordered, and appointments scheduled.

## 2016-10-01 NOTE — Telephone Encounter (Signed)
   Per MR  -    Results for AILI, CASILLAS (MRN [id number]) as of 09/16/2016 16:49  Ref. Range 08/08/2014 09:58 11/09/2015 10:48 08/04/2016 10:57  DLCO unc Latest Units: ml/min/mmHg 18.83 17.03 16.89  DLCO unc % pred Latest Units: % 66 60 59    There might be slow decline  In lung function but is also  Dependent on how she feels. Wil see her in august 2018 and discuss  Dr. Brand Males, M.D., [facility].C.P Pulmonary and Critical Care Medicine Staff Physician [place] [facility] Pager: [phone], If no answer or between  15:00h - 7:00h: call [phone]  09/16/2016 4:50 PM

## 2016-10-02 ENCOUNTER — Ambulatory Visit (HOSPITAL_COMMUNITY)
Admission: RE | Admit: 2016-10-02 | Discharge: 2016-10-02 | Disposition: A | Discharge status: Home / Self Care | Payer: Medicare Other | Source: Ambulatory Visit | Attending: Internal Medicine | Admitting: Internal Medicine

## 2016-10-02 DIAGNOSIS — I4891 Unspecified atrial fibrillation: Secondary | ICD-10-CM | POA: Insufficient documentation

## 2016-10-02 LAB — BASIC METABOLIC PANEL
ANION GAP: 8 (ref 5–15)
BUN: 16 mg/dL (ref 6–20)
CHLORIDE: 106 mmol/L (ref 101–111)
CO2: 24 mmol/L (ref 22–32)
Calcium: 9.8 mg/dL (ref 8.9–10.3)
Creatinine, Ser: 0.87 mg/dL (ref 0.44–1.00)
Glucose, Bld: 110 mg/dL — ABNORMAL HIGH (ref 65–99)
POTASSIUM: 4.2 mmol/L (ref 3.5–5.1)
SODIUM: 138 mmol/L (ref 135–145)

## 2016-10-09 ENCOUNTER — Ambulatory Visit (INDEPENDENT_AMBULATORY_CARE_PROVIDER_SITE_OTHER): Payer: Medicare Other | Admitting: Internal Medicine

## 2016-10-09 ENCOUNTER — Encounter: Payer: Self-pay | Admitting: Internal Medicine

## 2016-10-09 VITALS — BP 120/78 | HR 93 | Ht 67.0 in | Wt 193.4 lb

## 2016-10-09 DIAGNOSIS — R0600 Dyspnea, unspecified: Secondary | ICD-10-CM

## 2016-10-09 DIAGNOSIS — R0689 Other abnormalities of breathing: Secondary | ICD-10-CM | POA: Diagnosis not present

## 2016-10-09 DIAGNOSIS — J849 Interstitial pulmonary disease, unspecified: Secondary | ICD-10-CM

## 2016-10-09 NOTE — Addendum Note (Signed)
Addended by: Collier Salina on: 10/09/2016 11:26 AM   Modules accepted: Orders

## 2016-10-09 NOTE — Progress Notes (Signed)
Subjective:     [name] ID: Savannah Savannah [name], female   DOB: 1945/04/04, 71 y.o.   MRN: [id number]  HPI   IOV 12/10/2015 - history some the [name] and review of the outside records from Savannah Savannah [name] in cardiology office notes 11/19/2015 Savannah Savannah [name]  Chief Complaint  [name] presents with  . Pulmonary Consult    Referred by Savannah Savannah [name]- Scleroderma - Had HRCT 11/22/15- SOB with walking up driveway or up steps - Denies cough or wheezing    71 year old female was diagnosed with scleroderma in 2008 when she was 71 years old. Notice many problems with the skin in her fingers and the form of Savannah [name]. I According to her history and referral notes she did have Savannah [name] with Savannah. Wilhemina [name] that confirmed the scleroderma diagnosis     In 2008 she had dyspnea. She had Savannah [name] at that time she had Savannah [name] for stress ejection fraction was 81% but resting echo showe d ejection fraction of 45%. She does not remember this but this is per cardiology notes. He tells me now that she's had insidious onset of dyspnea for the last few years. It has been steadily progressive. It is moderate in intensity. She notices this when she climbs stairs or goes to [employer]. It is relieved by rest. It is not associated with any chest pain. This no associated cough or edema or orthopnea paroxysmal nocturnal dyspnea presyncope. According to the cardiology notes she snores Savannah [name] lot.  She had echocardiogram this fall 2017 document below that shows grade 1 diastolic dysfunction with good left ventricular ejection fraction. Right ventricular pump function is normal. No comment on pulmonary systolic pressures.  She underwent pulmonary function test that shows isolated reduction in diffusion capacity documented below. She then underwent high-resolution CT chest that I personally visualized shows some faint groundglass opacities. Therefore she has been referred here.  She tells me that for her scleroderma she's been  treated with methotrexate in The past. She Does Have the Details. She Says This Did Not Help Her. Overall She Is Savannah [name] Nonsmoker. She Drinks Alcohol Socially. She Does Walking for Exercise but Does Have Right Knee Joint Discomfort. Rheumatology Notes Indicate Significant Issues with Osteoarthritis Especially of the Right Knee Is Documented in Office Visits in November 2016 and Subsequently.Also arthritis is also documented in her hand joints   Tests below  Pulmonary function test 11/09/2015 FVC 2.4 L/1%, FEV1 2.6 L/104%. DLCO 5.3/95% and DLCO 17.03/60%. She has isolated reduction in diffusion capacity  IMPRESSION: HRCT 11/22/15 1. Mild patchy subpleural ground-glass attenuation and reticulation in both lungs, most prominent in the lower lobes. No significant traction bronchiectasis. No frank honeycombing. 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 Savannah [name] follow-up high-resolution chest CT study in 12 months would be useful to assess temporal pattern stability. 2. Additional findings include aortic atherosclerosis, three-vessel coronary atherosclerosis and possible 0.9 cm lower left thyroid lobe nodule.   Electronically Signed   By: Savannah Savannah [name] M.D.   On: 11/22/2015 13:14  Walking desaturation test 185 feet 3 laps on room air today in the office 12/10/2015: Resting pulse ox was 94% after 3 laps pulse ox was 98%. She had some pain with her right knee where she had no dyspnea. Resting heart rate was 87/m Savannah Savannah [name] to 93/m  Echocardiogram 11/19/2015 shows grade 1 diastolic dysfunction but otherwise reported as normal. Including right ventricle    OV 10/09/2016  Chief Complaint  [name] presents with  . Follow-up    Pt states she feels she is not able to breathe in enough air. Pt denies significant cough. Pt states she has chest tightness at random times. Pt denies f/c/s.    Follow-up scleroderma with ILD associated with mild  reduction in diffusion capacity - mild severity of disease on observation therapy  Last seen October 2017. Since then she's been on observation therapy. She had pulmonary function testing in June 2018 that in the last 2 years is showing mild decline in diffusion capacity. She tells me that correlating with this she is feeling more shortness of breath with exertion and even at rest she feels that she has to take Savannah [name] deep breath. There is mild associated cough occasionally but there is no wheeze. There is no fever or edema. Otherwise feeling well. She has some questions about getting pneumonia vaccine. Apparently she's getting an alert about this. Review of the chart shows that she is up-to-date with both Pneumovax and Prevnar within 5 years  Walking desaturation test 185 feet 3 laps on room air: Walking desaturation test on 10/09/2016 185 feet x 3 laps on RA :  did NOT  desaturate. Rest pulse ox was 98%, final pulse ox was 98%. HR response 83/min at rest to 98/min at peak exertion.     Results for Savannah Savannah [name] (MRN [id number]) as of 10/09/2016 10:51  Ref. Range 08/08/2014 09:58 11/09/2015 10:48 08/04/2016 10:57  FVC-Pre Latest Units: L 3.36 3.39 3.24  FVC-%Pred-Pre Latest Units: % 99 101 98   Results for Savannah Savannah [name] (MRN [id number]) as of 10/09/2016 10:51  Ref. Range 08/08/2014 09:58 11/09/2015 10:48 08/04/2016 10:57  DLCO unc Latest Units: ml/min/mmHg 18.83 17.03 16.89  DLCO unc % pred Latest Units: % 66 60 59     has Savannah [name] past medical history of Dyspnea; Hyperlipidemia; Hypertension; Swelling of extremity; and TIA (transient ischemic attack).   reports that she has never smoked. She has never used smokeless tobacco.  No past surgical history on file.  Allergies  Allergen Reactions  . Codeine Nausea Only  . Cymbalta [Duloxetine Hcl] Hives  . Lyrica [Pregabalin] Hives  . Penicillins   . Sulfa Antibiotics Hives  . Zetia [Ezetimibe] Other (See Comments)    Myalgia  . Zocor [Simvastatin] Other (See  Comments)    Myalgia    Immunization History  Administered Date(s) Administered  . Pneumococcal Conjugate-13 11/20/2014  . Pneumococcal Polysaccharide-23 11/17/2013  . Tdap 07/29/2016  . Zoster 12/09/2009    Family History  Problem Relation Age of Onset  . Hyperlipidemia Father   . Cirrhosis Father 75  . Kidney failure Father 61  . Stroke Maternal Grandmother 85  . Hypertension Maternal Grandmother 40  . Angina Maternal Grandfather 31  . Heart attack Maternal Grandfather 88  . Lung disease Paternal Grandmother   . Atrial fibrillation Mother 77  . Hypertension Mother      Current Outpatient Prescriptions:  .  aspirin 81 MG tablet, Take 81 mg by mouth., Disp: , Rfl:  .  Cholecalciferol (VITAMIN D3) 50000 units TABS, Take 50,000 Units by mouth once Savannah [name] week., Disp: , Rfl:  .  Coenzyme Q10 (CO Q 10) 100 MG CAPS, Take 200 mg by mouth., Disp: , Rfl:  .  diphenhydrAMINE (BENADRYL) 25 mg capsule, Take 25 mg by mouth every 6 (six) hours as needed., Disp: , Rfl:  .  furosemide (LASIX) 20 MG tablet, Take 1 tablet (20 mg total) by mouth daily as  needed for fluid or edema., Disp: 90 tablet, Rfl: 3 .  loratadine (CLARITIN) 10 MG tablet, Take 10 mg by mouth daily as needed for allergies., Disp: , Rfl:  .  losartan (COZAAR) 100 MG tablet, Take 1 tablet (100 mg total) by mouth at bedtime., Disp: 30 tablet, Rfl: 3 .  Menaquinone-7 (VITAMIN K2 PO), Take 50 mcg by mouth once Savannah [name] week., Disp: , Rfl:  .  Pitavastatin Calcium (LIVALO) 2 MG TABS, Take 1 tablet (2 mg total) by mouth daily., Disp: 90 tablet, Rfl: 0 .  potassium chloride SA (KLOR-CON M20) 20 MEQ tablet, Take 1 tablet (20 mEq total) by mouth daily as needed. When you take Furosemide (Lasix)., Disp: 90 tablet, Rfl: 3 .  vitamin E 400 UNIT capsule, Take 400 Units by mouth daily., Disp: , Rfl:     Review of Systems     Objective:   Physical Exam  Constitutional: She is oriented to person, place, and time. She appears well-developed and  well-nourished. No distress.  HENT:  Head: Normocephalic and atraumatic.  Right Ear: External ear normal.  Left Ear: External ear normal.  Mouth/Throat: Oropharynx is clear and moist. No oropharyngeal exudate.  Eyes: Pupils are equal, round, and reactive to light. Conjunctivae and EOM are normal. Right eye exhibits no discharge. Left eye exhibits no discharge. No scleral icterus.  Neck: Normal range of motion. Neck supple. No JVD present. No tracheal deviation present. No thyromegaly present.  Cardiovascular: Normal rate, regular rhythm, normal heart sounds and intact distal pulses.  Exam reveals no gallop and no friction rub.   No murmur heard. Normal heart sounds and no loud P2 of second heart sound  Pulmonary/Chest: Effort normal and breath sounds normal. No respiratory distress. She has no wheezes. She has no rales. She exhibits no tenderness.  Can hardly hear crackles  Abdominal: Soft. Bowel sounds are normal. She exhibits no distension and no mass. There is no tenderness. There is no rebound and no guarding.  Musculoskeletal: Normal range of motion. She exhibits no edema or tenderness.  Lymphadenopathy:    She has no cervical adenopathy.  Neurological: She is alert and oriented to person, place, and time. She has normal reflexes. No cranial nerve deficit. She exhibits normal muscle tone. Coordination normal.  Skin: Skin is warm and dry. No rash noted. She is not diaphoretic. No erythema. No pallor.  Mild scleroderma the fingers  Psychiatric: She has Savannah [name] normal mood and affect. Her behavior is normal. Judgment and thought content normal.  Vitals reviewed.  Vitals:   10/09/16 1033  BP: 120/78  Pulse: 93  SpO2: 97%  Weight: 193 lb 6.4 oz (87.7 kg)  Height: 5\' 7"  (1.702 m)    .Estimated body mass index is 30.29 kg/m as calculated from the following:   Height as of this encounter: 5\' 7"  (1.702 m).   Weight as of this encounter: 193 lb 6.4 oz (87.7 kg).       Assessment:        ICD-10-CM   1. ILD (interstitial lung disease) ([place]) J84.9   2. Dyspnea and respiratory abnormalities R06.00    R06.89        Plan:       I'm not seeing me to evidence that your lung function has declined significantly However there is some suggestion that it might be beginning to decline On the same token your shortness of breath could also be related to diastolic dysfunction or developing pulmonary hypertension or just physical deconditioning  Plan - When you see Savannah Savannah [name] please discuss with him the possibility of pulmonary hypertension or diastolic dysfunction causing your shortness of breath - At this time we should formally restage your interstitial lung disease  - In 2 months do Pre-bd spiro and dlco only. No lung volume or bd response. No post-bd spiro  - In 2 months do high resolution CT scan of the chest  Follow-up - Return to see me in 2 months after test complete or sooner if needed     > 50% of this > 25 min visit spent in face to face counseling or coordination of care    Savannah. Brand Males, M.D., [facility].C.P Pulmonary and Critical Care Medicine Staff Physician [place] [facility] Pager: [phone], If no answer or between  15:00h - 7:00h: call [phone]  10/09/2016 11:15 AM

## 2016-10-09 NOTE — Patient Instructions (Addendum)
ICD-10-CM   1. ILD (interstitial lung disease) ([place]) J84.9   2. Dyspnea and respiratory abnormalities R06.00    R06.89     Results for ZURY, FAZZINO (MRN [id number]) as of 10/09/2016 10:51  Ref. Range 08/08/2014 09:58 11/09/2015 10:48 08/04/2016 10:57  FVC-Pre Latest Units: L 3.36 3.39 3.24  FVC-%Pred-Pre Latest Units: % 99 101 98   Results for Uballe, Imogene A (MRN [id number]) as of 10/09/2016 10:51  Ref. Range 08/08/2014 09:58 11/09/2015 10:48 08/04/2016 10:57  DLCO unc Latest Units: ml/min/mmHg 18.83 17.03 16.89  DLCO unc % pred Latest Units: % 66 60 59     I'm not seeing me to evidence that your lung function has declined significantly However there is some suggestion that it might be beginning to decline On the same token your shortness of breath could also be related to diastolic dysfunction or developing pulmonary hypertension or just physical deconditioning   Plan - When you see Dr. Haroldine Laws please discuss with him the possibility of pulmonary hypertension or diastolic dysfunction causing your shortness of breath - At this time we should formally restage your interstitial lung disease  - In 2 months do Pre-bd spiro and dlco only. No lung volume or bd response. No post-bd spiro  - In 2 months do high resolution CT scan of the chest  Follow-up - Return to see me in 2 months after test complete or sooner if needed

## 2016-10-10 ENCOUNTER — Other Ambulatory Visit: Payer: Medicare Other

## 2016-11-03 NOTE — Progress Notes (Deleted)
   Procedure Note  Patient: Savannah [name]             Date of Birth: 10/04/1945           MRN: [id number]             Visit Date: 11/12/2016  Procedures: Visit Diagnoses: No diagnosis found. Euflexxa #1 right knee  Large Joint Inj Date/Time: 11/12/2016 9:11 AM Performed by: Bo Merino Authorized by: Bo Merino   Consent Given by:  Patient Site marked: the procedure site was marked   Timeout: prior to procedure the correct patient, procedure, and site was verified   Indications:  Pain Location:  Knee Site:  R knee Prep: patient was prepped and draped in usual sterile fashion   Needle Size:  27 G Needle Length:  1.5 inches Ultrasound Guidance: No   Fluoroscopic Guidance: No   Arthrogram: No   Medications:  20 mg Sodium Hyaluronate 20 MG/2ML; 1.5 mL lidocaine 1 % Aspiration Attempted: Yes   Patient tolerance:  Patient tolerated the procedure well with no immediate complications

## 2016-11-12 ENCOUNTER — Ambulatory Visit (INDEPENDENT_AMBULATORY_CARE_PROVIDER_SITE_OTHER): Payer: Medicare Other | Admitting: Rheumatology

## 2016-11-12 DIAGNOSIS — M1711 Unilateral primary osteoarthritis, right knee: Secondary | ICD-10-CM | POA: Diagnosis not present

## 2016-11-12 MED ORDER — SODIUM HYALURONATE (VISCOSUP) 20 MG/2ML IX SOSY
20.0000 mg | PREFILLED_SYRINGE | INTRA_ARTICULAR | Status: AC | PRN
  Administered 2016-11-12: 20 mg via INTRA_ARTICULAR

## 2016-11-12 MED ORDER — LIDOCAINE HCL 1 % IJ SOLN
1.5000 mL | INTRAMUSCULAR | Status: AC | PRN
  Administered 2016-11-12: 1.5 mL

## 2016-11-12 NOTE — Progress Notes (Signed)
   Procedure Note  Patient: Savannah [name]             Date of Birth: 12/12/1945           MRN: [id number]             Visit Date: 11/12/2016  Procedures: Visit Diagnoses: Unilateral primary osteoarthritis, right knee - Plan: CANCELED: Large Joint Injection/Arthrocentesis   Euflexxa Right knee buy and bill #1.  Large Joint Inj Date/Time: 11/12/2016 9:28 AM Performed by: Bo Merino Authorized by: Bo Merino   Consent Given by:  Patient Site marked: the procedure site was marked   Timeout: prior to procedure the correct patient, procedure, and site was verified   Indications:  Pain Location:  Knee Site:  R knee Prep: patient was prepped and draped in usual sterile fashion   Needle Size:  27 G Needle Length:  1.5 inches Ultrasound Guidance: No   Fluoroscopic Guidance: No   Arthrogram: No   Medications:  1.5 mL lidocaine 1 %; 20 mg Sodium Hyaluronate 20 MG/2ML

## 2016-11-12 NOTE — Progress Notes (Signed)
   Procedure Note  Patient: Savannah [name]             Date of Birth: Jan 09, 1946           MRN: [id number]             Visit Date: 11/12/2016  Procedures: Euflexxa #1 to  right knee joint Visit Diagnoses: Unilateral primary osteoarthritis, right knee - Plan: Large Joint Injection/Arthrocentesis, CANCELED: Large Joint Injection/Arthrocentesis  Large Joint Inj Date/Time: 11/12/2016 9:32 AM Performed by: Bo Merino Authorized by: Bo Merino   Consent Given by:  Patient Site marked: the procedure site was marked   Timeout: prior to procedure the correct patient, procedure, and site was verified   Indications:  Pain and joint swelling Location:  Knee Site:  R knee Prep: patient was prepped and draped in usual sterile fashion   Needle Size:  27 G Needle Length:  1.5 inches Approach:  Medial Ultrasound Guidance: No   Fluoroscopic Guidance: No   Arthrogram: No   Medications:  1.5 mL lidocaine 1 %; 20 mg Sodium Hyaluronate 20 MG/2ML Aspiration Attempted: Yes   Aspirate amount (mL):  0 Patient tolerance:  Patient tolerated the procedure well with no immediate complications    Bo Merino, MD

## 2016-11-13 NOTE — Progress Notes (Signed)
   Procedure Note  Patient: Savannah [name]             Date of Birth: [birth year]           MRN: [id number]             Visit Date: 11/19/2016  Procedures: Visit Diagnoses: Primary osteoarthritis of both knees - Plan: Large Joint Injection/Arthrocentesis Euflexxa #2 Right knee, buy and bill.   Large Joint Inj Date/Time: 11/19/2016 10:41 AM Performed by: Bo Merino Authorized by: Bo Merino   Consent Given by:  Patient Site marked: the procedure site was marked   Timeout: prior to procedure the correct patient, procedure, and site was verified   Indications:  Pain and joint swelling Location:  Knee Site:  R knee Prep: patient was prepped and draped in usual sterile fashion   Needle Size:  27 G Needle Length:  1.5 inches Approach:  Medial Ultrasound Guidance: No   Fluoroscopic Guidance: No   Arthrogram: No   Medications:  1.5 mL lidocaine 1 %; 20 mg Sodium Hyaluronate 20 MG/2ML Aspiration Attempted: Yes   Patient tolerance:  Patient tolerated the procedure well with no immediate complications    Bo Merino, MD

## 2016-11-18 NOTE — Progress Notes (Signed)
   Procedure Note  Patient: Savannah [name]             Date of Birth: 10/28/1945           MRN: [id number]             Visit Date: 11/26/2016  Procedures: Visit Diagnoses: Unilateral primary osteoarthritis, right knee - Plan: Large Joint Injection/Arthrocentesis Euflexxa #3 right knee  Large Joint Inj Date/Time: 11/26/2016 10:48 AM Performed by: Bo Merino Authorized by: Bo Merino   Consent Given by:  Patient Site marked: the procedure site was marked   Timeout: prior to procedure the correct patient, procedure, and site was verified   Indications:  Pain Location:  Knee Site:  R knee Prep: patient was prepped and draped in usual sterile fashion   Needle Size:  27 G Needle Length:  1.5 inches Ultrasound Guidance: No   Fluoroscopic Guidance: No   Arthrogram: No   Medications:  20 mg Sodium Hyaluronate 20 MG/2ML; 1.5 mL lidocaine 1 % Aspiration Attempted: Yes   Patient tolerance:  Patient tolerated the procedure well with no immediate complications    Bo Merino, MD

## 2016-11-19 ENCOUNTER — Ambulatory Visit (INDEPENDENT_AMBULATORY_CARE_PROVIDER_SITE_OTHER): Payer: Medicare Other | Admitting: Rheumatology

## 2016-11-19 DIAGNOSIS — M1711 Unilateral primary osteoarthritis, right knee: Secondary | ICD-10-CM

## 2016-11-19 DIAGNOSIS — M170 Bilateral primary osteoarthritis of knee: Secondary | ICD-10-CM

## 2016-11-19 MED ORDER — SODIUM HYALURONATE (VISCOSUP) 20 MG/2ML IX SOSY
20.0000 mg | PREFILLED_SYRINGE | INTRA_ARTICULAR | Status: AC | PRN
  Administered 2016-11-19: 20 mg via INTRA_ARTICULAR

## 2016-11-19 MED ORDER — LIDOCAINE HCL 1 % IJ SOLN
1.5000 mL | INTRAMUSCULAR | Status: AC | PRN
  Administered 2016-11-19: 1.5 mL

## 2016-11-26 ENCOUNTER — Ambulatory Visit (INDEPENDENT_AMBULATORY_CARE_PROVIDER_SITE_OTHER): Payer: Medicare Other | Admitting: Rheumatology

## 2016-11-26 DIAGNOSIS — M1711 Unilateral primary osteoarthritis, right knee: Secondary | ICD-10-CM

## 2016-11-26 MED ORDER — SODIUM HYALURONATE (VISCOSUP) 20 MG/2ML IX SOSY
20.0000 mg | PREFILLED_SYRINGE | INTRA_ARTICULAR | Status: AC | PRN
  Administered 2016-11-26: 20 mg via INTRA_ARTICULAR

## 2016-11-26 MED ORDER — LIDOCAINE HCL 1 % IJ SOLN
1.5000 mL | INTRAMUSCULAR | Status: AC | PRN
  Administered 2016-11-26: 1.5 mL

## 2016-12-02 ENCOUNTER — Encounter (HOSPITAL_COMMUNITY): Payer: Medicare Other | Admitting: Internal Medicine

## 2016-12-02 DIAGNOSIS — D225 Melanocytic nevi of trunk: Secondary | ICD-10-CM | POA: Diagnosis not present

## 2016-12-02 DIAGNOSIS — D2261 Melanocytic nevi of right upper limb, including shoulder: Secondary | ICD-10-CM | POA: Diagnosis not present

## 2016-12-02 DIAGNOSIS — D2262 Melanocytic nevi of left upper limb, including shoulder: Secondary | ICD-10-CM | POA: Diagnosis not present

## 2016-12-02 DIAGNOSIS — D2272 Melanocytic nevi of left lower limb, including hip: Secondary | ICD-10-CM | POA: Diagnosis not present

## 2016-12-02 DIAGNOSIS — D2271 Melanocytic nevi of right lower limb, including hip: Secondary | ICD-10-CM | POA: Diagnosis not present

## 2016-12-02 DIAGNOSIS — L821 Other seborrheic keratosis: Secondary | ICD-10-CM | POA: Diagnosis not present

## 2016-12-02 DIAGNOSIS — D485 Neoplasm of uncertain behavior of skin: Secondary | ICD-10-CM | POA: Diagnosis not present

## 2016-12-05 ENCOUNTER — Encounter (HOSPITAL_COMMUNITY): Payer: Self-pay | Admitting: Internal Medicine

## 2016-12-05 ENCOUNTER — Ambulatory Visit (HOSPITAL_COMMUNITY)
Admission: RE | Admit: 2016-12-05 | Discharge: 2016-12-05 | Disposition: A | Discharge status: Home / Self Care | Payer: Medicare Other | Source: Ambulatory Visit | Attending: Internal Medicine | Admitting: Internal Medicine

## 2016-12-05 VITALS — BP 138/78 | HR 82 | Wt 191.6 lb

## 2016-12-05 DIAGNOSIS — I5032 Chronic diastolic (congestive) heart failure: Secondary | ICD-10-CM

## 2016-12-05 DIAGNOSIS — R0683 Snoring: Secondary | ICD-10-CM | POA: Insufficient documentation

## 2016-12-05 DIAGNOSIS — M1711 Unilateral primary osteoarthritis, right knee: Secondary | ICD-10-CM | POA: Insufficient documentation

## 2016-12-05 DIAGNOSIS — J849 Interstitial pulmonary disease, unspecified: Secondary | ICD-10-CM | POA: Insufficient documentation

## 2016-12-05 DIAGNOSIS — Z79899 Other long term (current) drug therapy: Secondary | ICD-10-CM | POA: Diagnosis not present

## 2016-12-05 DIAGNOSIS — I10 Essential (primary) hypertension: Secondary | ICD-10-CM | POA: Diagnosis not present

## 2016-12-05 DIAGNOSIS — Z7982 Long term (current) use of aspirin: Secondary | ICD-10-CM | POA: Diagnosis not present

## 2016-12-05 DIAGNOSIS — Z8673 Personal history of transient ischemic attack (TIA), and cerebral infarction without residual deficits: Secondary | ICD-10-CM | POA: Diagnosis not present

## 2016-12-05 DIAGNOSIS — I38 Endocarditis, valve unspecified: Secondary | ICD-10-CM

## 2016-12-05 DIAGNOSIS — M3481 Systemic sclerosis with lung involvement: Secondary | ICD-10-CM | POA: Diagnosis not present

## 2016-12-05 DIAGNOSIS — E785 Hyperlipidemia, unspecified: Secondary | ICD-10-CM | POA: Insufficient documentation

## 2016-12-05 DIAGNOSIS — I5189 Other ill-defined heart diseases: Secondary | ICD-10-CM

## 2016-12-05 NOTE — Progress Notes (Signed)
Patient ID: [name], female   DOB: [birth year], 71 y.o.   MRN: [id number]  Referring Physician: Estanislado Pandy Primary Care: Deveshwar Primary Cardiologist: Debara Pickett  HPI: Savannah [name] is a 71 y.o. female with hx of Scleroderma, TIA, HTN, Dyslipidemia, and LE varicose veins. She was referred by Dr. Estanislado Pandy for screening for [facility] in setting of scleroderma.  Says she was diagnosed with scleroderma around 2005. Noticed skin on her fingers had bx which confirmed scleroderma.   Had dyspnea in 2008. And had a stress test and echo. Her EF was  45-55% by Echo 11/08. Myoview in 11/08 negative for ischemia with post stress test EF of 81%.  Here for routine f/u. At last visit echo normal but found to have mild ILD on CT scan. Referred to Dr. Chase Caller. Has repeat hi res CT pending this month. Notices her breathing is worse when walking up the driveway. No orthopnea or PND. BP very labile but in reviewing readings SBP ranges 100-150 with average SBP 120-130. No cough. Having trouble with R knee arthritis and got injections. Mild edema. Struggles with arthritis.   Hi-res CT 10/17: c/w with mild ILD   Echo 08/18/14 EF 60% Grade I DD. Normal RV. No TR.  Echo 10/17  EF 55-60% RV normal  PFTs 6/18 FEV1 2.55 L (101%) FVC   3.24 L (98%) DLCO  59%  PFTs  11/09/15 FEV1 2.65 L (99%) FVC   3.39 L (101%) DLCO  60%  PFTs 08/08/14 FEV1 2.54 L (99%) FVC   3.36 L (99%) DLCO  66%   Past Medical History:  Diagnosis Date  . Dyspnea    MYOVIEW, 12/29/2006 - post-stress EF 81%,no ECG changes, EKG negative for ischemia  . Hyperlipidemia   . Hypertension    2D ECHO, 12/24/2006 - EF 45-55%, normal  . Swelling of extremity    LEA VENOUS DUPLEX, 08/19/2011 - deep valvular insufficiency noted with right femoral and popliteal veins and left common femoral vein  . TIA (transient ischemic attack)    CAROTID DOPPLER, 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    Current Outpatient Prescriptions    Medication Sig Dispense Refill  . aspirin 81 MG tablet Take 81 mg by mouth.    . Cholecalciferol (VITAMIN D3) 50000 units TABS Take 50,000 Units by mouth once a week.    . Coenzyme Q10 (CO Q 10) 100 MG CAPS Take 200 mg by mouth.    . diphenhydrAMINE (BENADRYL) 25 mg capsule Take 25 mg by mouth every 6 (six) hours as needed.    . furosemide (LASIX) 20 MG tablet Take 1 tablet (20 mg total) by mouth daily as needed for fluid or edema. 90 tablet 3  . loratadine (CLARITIN) 10 MG tablet Take 10 mg by mouth daily as needed for allergies.    Marland Kitchen losartan (COZAAR) 100 MG tablet Take 1 tablet (100 mg total) by mouth at bedtime. 30 tablet 3  . Menaquinone-7 (VITAMIN K2 PO) Take 50 mcg by mouth once a week.    . Pitavastatin Calcium (LIVALO) 2 MG TABS Take 1 tablet (2 mg total) by mouth daily. 90 tablet 0  . potassium chloride SA (KLOR-CON M20) 20 MEQ tablet Take 1 tablet (20 mEq total) by mouth daily as needed. When you take Furosemide (Lasix). 90 tablet 3  . vitamin E 400 UNIT capsule Take 400 Units by mouth daily.     No current facility-administered medications for this encounter.     Allergies  Allergen Reactions  . Codeine Nausea Only  . Cymbalta [Duloxetine Hcl] Hives  . Lyrica [Pregabalin] Hives  . Penicillins   . Sulfa Antibiotics Hives  . Zetia [Ezetimibe] Other (See Comments)    Myalgia  . Zocor [Simvastatin] Other (See Comments)    Myalgia      Social History   Social History  . Marital status: Married    Spouse name: N/A  . Number of children: N/A  . Years of education: N/A   Occupational History  . Not on file.   Social History Main Topics  . Smoking status: Never Smoker  . Smokeless tobacco: Never Used  . Alcohol use 3.0 oz/week    6 drink(s) per week  . Drug use: No  . Sexual activity: Not on file   Other Topics Concern  . Not on file   Social History Narrative  . No narrative on file      Family History  Problem Relation Age of Onset  . Hyperlipidemia  Father   . Cirrhosis Father 64  . Kidney failure Father 92  . Stroke Maternal Grandmother 85  . Hypertension Maternal Grandmother 40  . Angina Maternal Grandfather 90  . Heart attack Maternal Grandfather 88  . Lung disease Paternal Grandmother   . Atrial fibrillation Mother 75  . Hypertension Mother     Vitals:   12/05/16 1212  BP: 138/78  Pulse: 82  SpO2: 97%  Weight: 191 lb 9.6 oz (86.9 kg)   BP 128/68  PHYSICAL EXAM: General:  Well appearing. No resp difficulty HEENT: normal Neck: supple. no JVD. Carotids 2+ bilat; no bruits. No lymphadenopathy or thryomegaly appreciated. Cor: PMI nondisplaced. Regular rate & rhythm. No rubs, gallops or murmurs. Lungs: clear Abdomen: obese soft, nontender, nondistended. No hepatosplenomegaly. No bruits or masses. Good bowel sounds. Extremities: no cyanosis, clubbing, rash, tr edema Varicose veins on left Neuro: alert & orientedx3, cranial nerves grossly intact. moves all 4 extremities w/o difficulty. Affect pleasant   ASSESSMENT & PLAN:  1. Scleroderma with related ILD -Overall doing well. PFTs relatively stable. Is following with Dr. Chase Caller and has repeat CT scan pending - No evidence of PAH on previous echo will need repeat echo. We will schedule 2. HTN  -Somewhat labile but overall well controlled  3. Dyslipidemia  4. TIA - no deficits 5. Snoring - refuses sleep study    Savannah Natarajan,MD 12:19 PM

## 2016-12-05 NOTE — Patient Instructions (Addendum)
Your physician has requested that you have an echocardiogram. Echocardiography is a painless test that uses sound waves to create images of your heart. It provides your doctor with information about the size and shape of your heart and how well your heart's chambers and valves are working. This procedure takes approximately one hour. There are no restrictions for this procedure.  Your physician recommends that you schedule a follow-up appointment in: 12 months with an echocardiogram

## 2016-12-09 ENCOUNTER — Ambulatory Visit (INDEPENDENT_AMBULATORY_CARE_PROVIDER_SITE_OTHER)
Admission: RE | Admit: 2016-12-09 | Discharge: 2016-12-09 | Disposition: A | Discharge status: Home / Self Care | Payer: Medicare Other | Source: Ambulatory Visit | Attending: Internal Medicine | Admitting: Internal Medicine

## 2016-12-09 DIAGNOSIS — J984 Other disorders of lung: Secondary | ICD-10-CM | POA: Diagnosis not present

## 2016-12-09 DIAGNOSIS — J849 Interstitial pulmonary disease, unspecified: Secondary | ICD-10-CM

## 2016-12-15 ENCOUNTER — Other Ambulatory Visit: Payer: Self-pay

## 2016-12-15 ENCOUNTER — Ambulatory Visit (HOSPITAL_COMMUNITY): Payer: Medicare Other | Attending: Cardiology

## 2016-12-15 DIAGNOSIS — Z8249 Family history of ischemic heart disease and other diseases of the circulatory system: Secondary | ICD-10-CM | POA: Insufficient documentation

## 2016-12-15 DIAGNOSIS — I253 Aneurysm of heart: Secondary | ICD-10-CM | POA: Diagnosis not present

## 2016-12-15 DIAGNOSIS — I38 Endocarditis, valve unspecified: Secondary | ICD-10-CM | POA: Insufficient documentation

## 2016-12-15 DIAGNOSIS — E785 Hyperlipidemia, unspecified: Secondary | ICD-10-CM | POA: Insufficient documentation

## 2016-12-15 DIAGNOSIS — G459 Transient cerebral ischemic attack, unspecified: Secondary | ICD-10-CM | POA: Diagnosis not present

## 2016-12-15 DIAGNOSIS — M349 Systemic sclerosis, unspecified: Secondary | ICD-10-CM | POA: Diagnosis not present

## 2016-12-15 DIAGNOSIS — J849 Interstitial pulmonary disease, unspecified: Secondary | ICD-10-CM | POA: Insufficient documentation

## 2016-12-15 DIAGNOSIS — I2510 Atherosclerotic heart disease of native coronary artery without angina pectoris: Secondary | ICD-10-CM | POA: Insufficient documentation

## 2016-12-15 DIAGNOSIS — I5032 Chronic diastolic (congestive) heart failure: Secondary | ICD-10-CM | POA: Diagnosis not present

## 2016-12-15 DIAGNOSIS — I340 Nonrheumatic mitral (valve) insufficiency: Secondary | ICD-10-CM | POA: Diagnosis not present

## 2016-12-15 DIAGNOSIS — I110 Hypertensive heart disease with heart failure: Secondary | ICD-10-CM | POA: Insufficient documentation

## 2016-12-17 ENCOUNTER — Ambulatory Visit (INDEPENDENT_AMBULATORY_CARE_PROVIDER_SITE_OTHER): Payer: Medicare Other | Admitting: Internal Medicine

## 2016-12-17 ENCOUNTER — Encounter: Payer: Self-pay | Admitting: Internal Medicine

## 2016-12-17 VITALS — BP 130/80 | HR 87 | Ht 67.0 in | Wt 193.0 lb

## 2016-12-17 DIAGNOSIS — R0689 Other abnormalities of breathing: Secondary | ICD-10-CM | POA: Diagnosis not present

## 2016-12-17 DIAGNOSIS — J849 Interstitial pulmonary disease, unspecified: Secondary | ICD-10-CM

## 2016-12-17 DIAGNOSIS — I519 Heart disease, unspecified: Secondary | ICD-10-CM

## 2016-12-17 DIAGNOSIS — R5381 Other malaise: Secondary | ICD-10-CM

## 2016-12-17 DIAGNOSIS — R0600 Dyspnea, unspecified: Secondary | ICD-10-CM

## 2016-12-17 DIAGNOSIS — I5189 Other ill-defined heart diseases: Secondary | ICD-10-CM

## 2016-12-17 LAB — PULMONARY FUNCTION TEST
DL/VA % PRED: 66 %
DL/VA: 3.44 ml/min/mmHg/L
DLCO COR: 17.02 ml/min/mmHg
DLCO UNC % PRED: 58 %
DLCO cor % pred: 60 %
DLCO unc: 16.59 ml/min/mmHg
FEF 25-75 Pre: 2.3 L/sec
FEF2575-%Pred-Pre: 116 %
FEV1-%Pred-Pre: 101 %
FEV1-Pre: 2.53 L
FEV1FVC-%PRED-PRE: 105 %
FEV6-%Pred-Pre: 102 %
FEV6-Pre: 3.21 L
FEV6FVC-%PRED-PRE: 105 %
FVC-%Pred-Pre: 97 %
FVC-Pre: 3.21 L
PRE FEV6/FVC RATIO: 100 %
Pre FEV1/FVC ratio: 79 %

## 2016-12-17 NOTE — Patient Instructions (Addendum)
ICD-10-CM   1. Dyspnea and respiratory abnormalities R06.00    R06.89   2. ILD (interstitial lung disease) ([place]) J84.9   3. Physical deconditioning R53.81   4. 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 - this is also because of physical deconditioning and stiff heart muscle or diastolic dysfunction - I recommended pulmonary rehabilitation which you can do in [place] with my care coordinator to help explain how to make a referral to a pulmonary rehabilitation program in [place]  #Interstitial lung disease - This is very mild on exam and on CT scan of the chest -On CT scan of the chest there is no progression of the disease compared to one year ago but on breathing tests indicate that might be mild progression - To prevent progression we normally use a drug called CellCept which is significant side effects - in the future that might be other medications which can prevent progression but with much milder side effects - at this point in time I recommendation is to continue observation therapy -\  #Follow-up -June/July 2019 after doing Pre-bd spiro and dlco only. No lung volume or bd response.

## 2016-12-17 NOTE — Progress Notes (Signed)
Subjective:     Patient ID: Savannah Savannah [name], female   DOB: 1945/07/21, 71 y.o.   MRN: [id number]  HPI    IOV 12/10/2015 - history some the patient and review of the outside records from Dr. Bronson [name] in cardiology office notes 11/19/2015 Dr. Haroldine [name]  Chief Complaint  Patient presents with  . 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 Notice many problems with the skin in her fingers and the form of Savannah [name]. I According to her history and referral notes she did have Savannah [name] with Dr. Wilhemina [name] that confirmed the scleroderma diagnosis     In 2008 she had dyspnea. She had Savannah [name] at that time she had Savannah [name] for stress ejection fraction was 81% but resting echo showe d ejection fraction of 45%. She does not remember this but this is per cardiology notes. He tells me now that she's had insidious onset of dyspnea for the last few years. It has been steadily progressive. It is moderate in intensity. She notices this when she climbs stairs or goes to [employer]. It is relieved by rest. It is not associated with any chest pain. This no associated cough or edema or orthopnea paroxysmal nocturnal dyspnea presyncope. According to the cardiology notes she snores Savannah [name] lot.  She had echocardiogram this fall 2017 document below that shows grade 1 diastolic dysfunction with good left ventricular ejection fraction. Right ventricular pump function is normal. No comment on pulmonary systolic pressures.  She underwent pulmonary function test that shows isolated reduction in diffusion capacity documented below. She then underwent high-resolution CT chest that I personally visualized shows some faint groundglass opacities. Therefore she has been referred here.  She tells me that for her scleroderma she's  been treated with methotrexate in The past. She Does Have the Details. She Says This Did Not Help Her. Overall She Is Savannah [name] Nonsmoker. She Drinks Alcohol Socially. She Does Walking for Exercise but Does Have Right Knee Joint Discomfort. Rheumatology Notes Indicate Significant Issues with Osteoarthritis Especially of the Right Knee Is Documented in Office Visits in November 2016 and Subsequently.Also arthritis is also documented in her hand joints   Tests below  Pulmonary function test 11/09/2015 FVC 2.4 L/1%, FEV1 2.6 L/104%. DLCO 5.3/95% and DLCO 17.03/60%. She has isolated reduction in diffusion capacity  IMPRESSION: HRCT 11/22/15 1. Mild patchy subpleural ground-glass attenuation and reticulation in both lungs, most prominent in the lower lobes. No significant traction bronchiectasis. No frank honeycombing. 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 Savannah [name] follow-up high-resolution chest CT study in 12 months would be useful to assess temporal pattern stability. 2. Additional findings include aortic atherosclerosis, three-vessel coronary atherosclerosis and possible 0.9 cm lower left thyroid lobe nodule.   Electronically Signed   By: Savannah Savannah [name] M.D.   On: 11/22/2015 13:14  Walking desaturation test 185 feet 3 laps on room air today in the office 12/10/2015: Resting pulse ox was 94% after 3 laps pulse ox was 98%. She had some pain with her right knee where she had no dyspnea. Resting heart rate was 87/m Savannah Savannah [name] to 93/m  Echocardiogram 11/19/2015 shows grade 1 diastolic dysfunction but otherwise reported as normal. Including right ventricle    OV 10/09/2016  Chief  Complaint  Patient presents with  . Follow-up    Pt states she feels she is not able to breathe in enough air. Pt denies significant cough. Pt states she has chest tightness at random times. Pt denies f/c/s.    Follow-up scleroderma with ILD associated with mild  reduction in diffusion capacity - mild severity of disease on observation therapy  Last seen October 2017. Since then she's been on observation therapy. She had pulmonary function testing in June 2018 that in the last 2 years is showing mild decline in diffusion capacity. She tells me that correlating with this she is feeling more shortness of breath with exertion and even at rest she feels that she has to take Savannah [name] deep breath. There is mild associated cough occasionally but there is no wheeze. There is no fever or edema. Otherwise feeling well. She has some questions about getting pneumonia vaccine. Apparently she's getting an alert about this. Review of the chart shows that she is up-to-date with both Pneumovax and Prevnar within 5 years  Walking desaturation test 185 feet 3 laps on room air: Walking desaturation test on 10/09/2016 185 feet x 3 laps on RA :  did NOT  desaturate. Rest pulse ox was 98%, final pulse ox was 98%. HR response 83/min at rest to 98/min at peak exertion.    OV 12/17/2016  Chief Complaint  Patient presents with  . Follow-up    pft done today and HRCT done 10/23. 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 This continues unchanged. Within the staging workup high-resolution CT scan of the chest shows mild subtle interstitial lung disease that is unchanged compared to one year ago. However on pulmonary function tests this seems to be Savannah [name] trend was decline in both FVC and DLCO. If there is worsening interstitial lung disease this is not reflected on the CT scan [as yet] we also look for pulmonary hypertension with echocardiogram which she had October 2018 shows grade 1 diastolic dysfunctionbut no documentation of pulmonary hypertension. She has never been to pulmonary rehabilitation because of her knee issues she  is Savannah [name] bit deconditioned. She is interested in pulmonary rehabilitation but she wants to get this done in [place] where she lives between January and May 2019    IMPRESSION: compared to oct 2017 1. 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 2. Aortic atherosclerosis, in addition to left main and 3 vessel coronary artery disease. Please note that although the presence of coronary artery calcium documents the presence of coronary artery disease, the severity of this disease and any potential stenosis cannot be assessed on this non-gated CT examination. Assessment for potential risk factor modification, dietary therapy or pharmacologic therapy may be warranted, if clinically indicated.  Aortic Atherosclerosis (ICD10-I70.0).   Electronically Signed   By: Vinnie Langton M.D.   On: 12/09/2016 12:07   Results for Savannah Savannah [name], Savannah Savannah [name] (MRN [id number]) as of 12/17/2016 11:16  Ref. Range 08/08/2014 09:58 11/09/2015 10:48 08/04/2016 10:57 12/17/2016 09:43  FVC-Pre Latest Units: L 3.36 3.39 3.24 3.21  FVC-%Pred-Pre Latest Units: % 99 101 98 97   Results for Savannah Savannah [name], Savannah Savannah [name] (MRN [id number]) as of 12/17/2016 11:16  Ref. Range 08/08/2014 09:58 11/09/2015 10:48 08/04/2016 10:57 12/17/2016 09:43  DLCO unc  Latest Units: ml/min/mmHg 18.83 17.03 16.89 16.59  DLCO unc % pred Latest Units: % 66 60 59 58     has Savannah [name] past medical history of Dyspnea; Hyperlipidemia; Hypertension; Swelling of extremity; and TIA (transient ischemic attack).   reports that she has never smoked. She has never used smokeless tobacco.  History reviewed. No pertinent surgical history.  Allergies  Allergen Reactions  . Codeine Nausea Only  . Cymbalta [Duloxetine Hcl] Hives  . Lyrica [Pregabalin] Hives  . Penicillins   . Sulfa Antibiotics Hives  . Zetia [Ezetimibe] Other (See Comments)    Myalgia  . Zocor  [Simvastatin] Other (See Comments)    Myalgia    Immunization History  Administered Date(s) Administered  . Pneumococcal Conjugate-13 11/20/2014  . Pneumococcal Polysaccharide-23 11/17/2013  . Tdap 07/29/2016  . Zoster 12/09/2009    Family History  Problem Relation Age of Onset  . Hyperlipidemia Father   . Cirrhosis Father 43  . Kidney failure Father 74  . Stroke Maternal Grandmother 85  . Hypertension Maternal Grandmother 40  . Angina Maternal Grandfather 4  . Heart attack Maternal Grandfather 88  . Lung disease Paternal Grandmother   . Atrial fibrillation Mother 63  . Hypertension Mother      Current Outpatient Prescriptions:  .  aspirin 81 MG tablet, Take 81 mg by mouth., Disp: , Rfl:  .  Cholecalciferol (VITAMIN D3) 50000 units TABS, Take 50,000 Units by mouth once Savannah [name] week., Disp: , Rfl:  .  Coenzyme Q10 (CO Q 10) 100 MG CAPS, Take 200 mg by mouth., Disp: , Rfl:  .  diphenhydrAMINE (BENADRYL) 25 mg capsule, Take 25 mg by mouth every 6 (six) hours as needed., Disp: , Rfl:  .  furosemide (LASIX) 20 MG tablet, Take 1 tablet (20 mg total) by mouth daily as needed for fluid or edema., Disp: 90 tablet, Rfl: 3 .  loratadine (CLARITIN) 10 MG tablet, Take 10 mg by mouth daily as needed for allergies., Disp: , Rfl:  .  losartan (COZAAR) 100 MG tablet, Take 1 tablet (100 mg total) by mouth at bedtime., Disp: 30 tablet, Rfl: 3 .  Menaquinone-7 (VITAMIN K2 PO), Take 50 mcg by mouth once Savannah [name] week., Disp: , Rfl:  .  Pitavastatin Calcium (LIVALO) 2 MG TABS, Take 1 tablet (2 mg total) by mouth daily., Disp: 90 tablet, Rfl: 0 .  potassium chloride SA (KLOR-CON M20) 20 MEQ tablet, Take 1 tablet (20 mEq total) by mouth daily as needed. When you take Furosemide (Lasix)., Disp: 90 tablet, Rfl: 3 .  vitamin E 400 UNIT capsule, Take 400 Units by mouth daily., Disp: , Rfl:     Review of Systems     Objective:   Physical Exam  Constitutional: She is oriented to person, place, and time. She  appears well-developed and well-nourished. No distress.  HENT:  Head: Normocephalic and atraumatic.  Right Ear: External ear normal.  Left Ear: External ear normal.  Mouth/Throat: Oropharynx is clear and moist. No oropharyngeal exudate.  Eyes: Pupils are equal, round, and reactive to light. Conjunctivae and EOM are normal. Right eye exhibits no discharge. Left eye exhibits no discharge. No scleral icterus.  Neck: Normal range of motion. Neck supple. No JVD present. No tracheal deviation present. No thyromegaly present.  Cardiovascular: Normal rate, regular rhythm, normal heart sounds and intact distal pulses.  Exam reveals no gallop and no friction rub.   No murmur heard. Pulmonary/Chest: Effort normal. No respiratory distress. She has no wheezes. She has  rales. She exhibits no tenderness.  Very mild subtle rales at base  Abdominal: Soft. Bowel sounds are normal. She exhibits no distension and no mass. There is no tenderness. There is no rebound and no guarding.  Musculoskeletal: Normal range of motion. She exhibits no edema or tenderness.  Lymphadenopathy:    She has no cervical adenopathy.  Neurological: She is alert and oriented to person, place, and time. She has normal reflexes. No cranial nerve deficit. She exhibits normal muscle tone. Coordination normal.  Skin: Skin is warm and dry. No rash noted. She is not diaphoretic. No erythema. No pallor.  Psychiatric: She has Savannah [name] normal mood and affect. Her behavior is normal. Judgment and thought content normal.  Vitals reviewed.  Vitals:   12/17/16 1103  BP: 130/80  Pulse: 87  SpO2: 96%  Weight: 193 lb (87.5 kg)  Height: 5\' 7"  (1.702 m)    Estimated body mass index is 30.23 kg/m as calculated from the following:   Height as of this encounter: 5\' 7"  (1.702 m).   Weight as of this encounter: 193 lb (87.5 kg).      Assessment:       ICD-10-CM   1. Dyspnea and respiratory abnormalities R06.00    R06.89   2. ILD (interstitial lung  disease) ([place]) J84.9   3. Physical deconditioning R53.81   4. 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 - this is also because of physical deconditioning and stiff heart muscle or diastolic dysfunction - I recommended pulmonary rehabilitation which you can do in [place]  - Meet with my care coordinator to help explain how to make Savannah [name] referral to Savannah [name] pulmonary rehabilitation program in [place]  #Interstitial lung disease - This is very mild on exam and on CT scan of the chest -On CT scan of the chest there is no progression of the disease compared to one year ago but on breathing tests indicate that might be mild progression - To prevent progression we normally use Savannah [name] drug called CellCept which is significant side effects - in the future that might be other medications which can prevent progression but with much milder side effects - at this point in time I recommendation is to continue observation therapy   #Follow-up -June/July 2019 after doing Pre-bd spiro and dlco only. No lung volume or bd response.    > 50% of this > 25 min visit spent in face to face counseling or coordination of care   Dr. Brand Males, M.D., [facility].C.P Pulmonary and Critical Care Medicine Staff Physician [place] [facility] Pager: [phone], If no answer or between  15:00h - 7:00h: call [phone]  12/17/2016 11:31 AM

## 2016-12-17 NOTE — Progress Notes (Signed)
PFT done today by Lindsay Lemons, CMA  

## 2016-12-23 ENCOUNTER — Other Ambulatory Visit: Payer: Self-pay | Admitting: Obstetrics & Gynecology

## 2016-12-23 ENCOUNTER — Other Ambulatory Visit (HOSPITAL_COMMUNITY): Payer: Self-pay | Admitting: *Deleted

## 2016-12-23 DIAGNOSIS — Z1231 Encounter for screening mammogram for malignant neoplasm of breast: Secondary | ICD-10-CM

## 2017-01-07 DIAGNOSIS — H16143 Punctate keratitis, bilateral: Secondary | ICD-10-CM | POA: Diagnosis not present

## 2017-01-07 DIAGNOSIS — Z8673 Personal history of transient ischemic attack (TIA), and cerebral infarction without residual deficits: Secondary | ICD-10-CM | POA: Diagnosis not present

## 2017-01-07 DIAGNOSIS — H2513 Age-related nuclear cataract, bilateral: Secondary | ICD-10-CM | POA: Diagnosis not present

## 2017-01-07 DIAGNOSIS — H35372 Puckering of macula, left eye: Secondary | ICD-10-CM | POA: Diagnosis not present

## 2017-01-07 NOTE — Progress Notes (Signed)
Office Visit Note  Patient: Savannah [name]             Date of Birth: [birth year]           MRN: [id number]             PCP: Janith Lima, MD Referring: Janith Lima, MD Visit Date: 01/20/2017 Occupation: @GUAROCC @    Subjective:  Left knee pain    History of Present Illness: Savannah [name] is a 71 y.o. female with a history of scleroderma, interstitial lung disease, and osteoarthritis.  Patient states she last saw Dr. Chase Caller in 12/17/16.  Patient states she continues to have shortness of breath with exertion, and Dr. Chase Caller recommended pulmonary rehab, which she is going to try to set up in [place].  She states she feels her scleroderma is unchanged and not worsening.   Denied difficulty swallowing or increased skin tightness.  She states she has had very mild Raynaud's symptoms.  She denies any joint swelling.       She states she has left knee pain, and she would like to apply for Euflexxa.  She states Euflexxa helped with her right knee pain significantly. Patient states Livalo has helped greatly with her neuropathy.    Activities of Daily Living:  Patient reports morning stiffness for 1  hour.   Patient Reports nocturnal pain.  Difficulty dressing/grooming: Denies Difficulty climbing stairs: Reports Difficulty getting out of chair: Reports Difficulty using hands for taps, buttons, cutlery, and/or writing: Denies   Review of Systems  Constitutional: Negative for fatigue and weakness.  HENT: Negative for mouth sores, trouble swallowing, trouble swallowing, mouth dryness and nose dryness.   Eyes: Positive for dryness. Negative for redness.  Respiratory: Positive for difficulty breathing (with exertion). Negative for cough, hemoptysis and shortness of breath.   Cardiovascular: Negative for chest pain, palpitations, hypertension, irregular heartbeat and swelling in legs/feet.  Gastrointestinal: Negative for abdominal pain, blood in stool, constipation, diarrhea, heartburn,  nausea and vomiting.  Endocrine: Negative for increased urination.  Genitourinary: Negative for painful urination.  Musculoskeletal: Positive for arthralgias, joint pain, joint swelling and morning stiffness. Negative for myalgias, muscle weakness, muscle tenderness and myalgias.  Skin: Positive for skin tightness. Negative for color change, pallor, rash, hair loss, nodules/bumps, redness, ulcers and sensitivity to sunlight.  Allergic/Immunologic: Negative for susceptible to infections.  Neurological: Negative for dizziness, numbness and headaches.  Hematological: Negative for swollen glands.  Psychiatric/Behavioral: Positive for depressed mood and sleep disturbance. The patient is nervous/anxious.     PMFS History:  Patient Active Problem List   Diagnosis Date Noted  . Raynaud's syndrome without gangrene 07/02/2016  . Osteopenia of multiple sites 07/02/2016  . Vitamin D deficiency 07/02/2016  . Primary osteoarthritis of both knees 07/02/2016  . Primary osteoarthritis of both hands 07/02/2016  . Primary osteoarthritis of both feet 07/02/2016  . History of diverticulosis 07/02/2016  . Pure hyperglyceridemia 12/27/2015  . Hyperglycemia 12/27/2015  . Dyspnea and respiratory abnormalities 12/10/2015  . ILD (interstitial lung disease) ([place]) 12/10/2015  . Physical deconditioning 12/10/2015  . Diastolic dysfunction 81/85/6314  . Edema of lower extremity 08/18/2014  . HTN (hypertension) 10/07/2011  . Dyslipidemia, goal LDL below 160 10/07/2011  . Scleroderma ([place]) 10/07/2011  . Varicose veins 10/07/2011    Past Medical History:  Diagnosis Date  . Dyspnea    MYOVIEW, 12/29/2006 - post-stress EF 81%,no ECG changes, EKG negative for ischemia  . Hyperlipidemia   . Hypertension    2D ECHO,  12/24/2006 - EF 45-55%, normal  . Swelling of extremity    LEA VENOUS DUPLEX, 08/19/2011 - deep valvular insufficiency noted with right femoral and popliteal veins and left common femoral vein  . TIA  (transient ischemic attack)    CAROTID DOPPLER, 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    Family History  Problem Relation Age of Onset  . Hyperlipidemia Father   . Cirrhosis Father 31  . Kidney failure Father 57  . Stroke Maternal Grandmother 85  . Hypertension Maternal Grandmother 40  . Angina Maternal Grandfather 14  . Heart attack Maternal Grandfather 88  . Lung disease Paternal Grandmother   . Atrial fibrillation Mother 80  . Hypertension Mother    History reviewed. No pertinent surgical history. Social History   Social History Narrative  . Not on file     Objective: Vital Signs: BP 133/68 (BP Location: Left Arm, Patient Position: Sitting, Cuff Size: Small)   Pulse 82   Wt 196 lb (88.9 kg)   BMI 30.70 kg/m    Physical Exam  Constitutional: She is oriented to person, place, and time. She appears well-developed and well-nourished.  HENT:  Head: Normocephalic and atraumatic.  Eyes: Conjunctivae and EOM are normal.  Neck: Normal range of motion.  Cardiovascular: Normal rate, regular rhythm, normal heart sounds and intact distal pulses.  Pulmonary/Chest: Effort normal and breath sounds normal.  Abdominal: Soft. Bowel sounds are normal.  Lymphadenopathy:    She has no cervical adenopathy.  Neurological: She is alert and oriented to person, place, and time.  Skin: Skin is warm and dry. Capillary refill takes less than 2 seconds.  Skin tightness present. No telangiectasias   No digital ulcers Calluses on plantar surface of bilateral feet   Psychiatric: She has a normal mood and affect. Her behavior is normal.  Nursing note and vitals reviewed.    Musculoskeletal Exam: C-spine, thoracic spine, lumbar spine good range of motion.  Shoulder joints, elbow joints, wrist range of motion with no synovitis.  MCPs, PIPs, and DIPs good ROM with no synovitis.  No digital ulcers.  Hip joints, knee joints, and ankle joints good ROM with no synovitis.   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  MTPs, PIPs, and DIPs good ROM with no synovitis.   CDAI Exam: No CDAI exam completed.    Investigation: No additional findings.   Imaging: No results found.  Speciality Comments: No specialty comments available.       Euflexxa performed today.     Blood pressure controlled in the office (133/68).   Procedures:  Large Joint Inj: L knee on 01/20/2017 2:31 PM Indications: pain Details: 27 G 1.5 in needle, medial approach  Arthrogram: No  Medications: 20 mg Sodium Hyaluronate 20 MG/2ML; 1.5 mL lidocaine 1 % Aspirate: 0 mL Outcome: tolerated well, no immediate complications Procedure, treatment alternatives, risks and benefits explained, specific risks discussed. Consent was given by the patient. Immediately prior to procedure a time out was called to verify the correct patient, procedure, equipment, support staff and site/side marked as required. Patient was prepped and draped in the usual sterile fashion.     Allergies: Codeine; Cymbalta [duloxetine hcl]; Lyrica [pregabalin]; Penicillins; Sulfa antibiotics; Zetia [ezetimibe]; and Zocor [simvastatin]   Assessment / Plan:     Visit Diagnoses: Scleroderma ([place]) - + skin biopsy with sclerodactyly. She has limited systemic sclerosis with the involvement of the skin only distal to the wrist joint and calf.  She has good circulation and  capillary refill on exam.  Skin thickening present.    - Plan: CBC with Differential/Platelet, COMPLETE METABOLIC PANEL WITH GFR, Urinalysis, Routine w reflex microscopic, CBC with Differential/Platelet, COMPLETE METABOLIC PANEL WITH GFR  ILD (interstitial lung disease) ([place]) - Patient states she has shortness of breath with exertion. Dr. Chase Caller 12/17/16 recommends continuing observation therapy.  His most recent exam and CT of the chest revealed very mild interstitial lung disease with no progression since a year prior.  He recommended that she attend  pulmonary rehabilitation, which she is going to set up in [place].  Most recent Echo performed on 12/15/16 revealed normal LV systolic function, mild diastolic dysfunction, and mild MR.  She is being followed by Dr. Haroldine Laws.  Raynaud's syndrome without gangrene: Stable.  No digital ulcers or gangrene noted.   Primary osteoarthritis of both hands: No joint pain or swelling today.    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  Patient had Euflexxa injections in her right knee in October 2018. She requested Euflexxa injections for her left knee.  Injection was performed today.    Primary osteoarthritis of both feet: She has calluses on the plantar aspect of her bilateral feet.  Discussed getting proper fitting shoes with extra support and metatarsal padding.   Osteopenia of multiple sites - calcium and vitamin D supplementation and resistive exercises were discussed.  Other medical conditions listed as follows:  History of diverticulosis  History of vitamin D deficiency  History of hyperlipidemia  History of hypertension: Well controlled in the office today-138/68.    Physical deconditioning: She will be starting pulmonary rehab in [place] in a couple months, which was a recommendation made by Dr. Chase Caller.     Orders: Orders Placed This Encounter  Procedures  . Large Joint Inj: L knee  . CBC with Differential/Platelet  . COMPLETE METABOLIC PANEL WITH GFR  . Urinalysis, Routine w reflex microscopic   No orders of the defined types were placed in this encounter.   Face-to-face time spent with patient was 30 minutes. Greater than 50% of time was spent in counseling and coordination of care.  Follow-Up Instructions: Return in about 6 months (around 07/21/2017) for Scleroderma.   Bo Merino, MD  Note - This record has been created using Editor, commissioning.  Chart creation errors have been sought, but may not always  have been located. Such creation  errors do not reflect on  the standard of medical care.

## 2017-01-20 ENCOUNTER — Encounter: Payer: Self-pay | Admitting: Rheumatology

## 2017-01-20 ENCOUNTER — Ambulatory Visit (INDEPENDENT_AMBULATORY_CARE_PROVIDER_SITE_OTHER): Payer: Medicare Other | Admitting: Rheumatology

## 2017-01-20 VITALS — BP 133/68 | HR 82 | Wt 196.0 lb

## 2017-01-20 DIAGNOSIS — M19041 Primary osteoarthritis, right hand: Secondary | ICD-10-CM

## 2017-01-20 DIAGNOSIS — I7300 Raynaud's syndrome without gangrene: Secondary | ICD-10-CM

## 2017-01-20 DIAGNOSIS — M19071 Primary osteoarthritis, right ankle and foot: Secondary | ICD-10-CM

## 2017-01-20 DIAGNOSIS — R5381 Other malaise: Secondary | ICD-10-CM | POA: Diagnosis not present

## 2017-01-20 DIAGNOSIS — J849 Interstitial pulmonary disease, unspecified: Secondary | ICD-10-CM

## 2017-01-20 DIAGNOSIS — M19042 Primary osteoarthritis, left hand: Secondary | ICD-10-CM | POA: Diagnosis not present

## 2017-01-20 DIAGNOSIS — Z8679 Personal history of other diseases of the circulatory system: Secondary | ICD-10-CM

## 2017-01-20 DIAGNOSIS — M8589 Other specified disorders of bone density and structure, multiple sites: Secondary | ICD-10-CM

## 2017-01-20 DIAGNOSIS — Z8639 Personal history of other endocrine, nutritional and metabolic disease: Secondary | ICD-10-CM | POA: Diagnosis not present

## 2017-01-20 DIAGNOSIS — M19072 Primary osteoarthritis, left ankle and foot: Secondary | ICD-10-CM

## 2017-01-20 DIAGNOSIS — Z8719 Personal history of other diseases of the digestive system: Secondary | ICD-10-CM | POA: Diagnosis not present

## 2017-01-20 DIAGNOSIS — M349 Systemic sclerosis, unspecified: Secondary | ICD-10-CM

## 2017-01-20 DIAGNOSIS — M170 Bilateral primary osteoarthritis of knee: Secondary | ICD-10-CM

## 2017-01-20 LAB — COMPLETE METABOLIC PANEL WITH GFR
AG Ratio: 1.8 (calc) (ref 1.0–2.5)
ALBUMIN MSPROF: 4.4 g/dL (ref 3.6–5.1)
ALKALINE PHOSPHATASE (APISO): 64 U/L (ref 33–130)
ALT: 34 U/L — AB (ref 6–29)
AST: 26 U/L (ref 10–35)
BILIRUBIN TOTAL: 1.1 mg/dL (ref 0.2–1.2)
BUN: 17 mg/dL (ref 7–25)
CHLORIDE: 106 mmol/L (ref 98–110)
CO2: 29 mmol/L (ref 20–32)
Calcium: 10.7 mg/dL — ABNORMAL HIGH (ref 8.6–10.4)
Creat: 0.9 mg/dL (ref 0.60–0.93)
GFR, EST AFRICAN AMERICAN: 74 mL/min/{1.73_m2} (ref >=60)
GFR, Est Non African American: 64 mL/min/{1.73_m2} (ref >=60)
GLOBULIN: 2.4 g/dL (ref 1.9–3.7)
GLUCOSE: 91 mg/dL (ref 65–99)
POTASSIUM: 4.7 mmol/L (ref 3.5–5.3)
Sodium: 142 mmol/L (ref 135–146)
Total Protein: 6.8 g/dL (ref 6.1–8.1)

## 2017-01-20 LAB — CBC WITH DIFFERENTIAL/PLATELET
BASOS ABS: 69 {cells}/uL (ref 0–200)
Basophils Relative: 1 %
EOS ABS: 228 {cells}/uL (ref 15–500)
EOS PCT: 3.3 %
HEMATOCRIT: 42.3 % (ref 35.0–45.0)
HEMOGLOBIN: 14.7 g/dL (ref 11.7–15.5)
Lymphs Abs: 1987 cells/uL (ref 850–3900)
MCH: 30.9 pg (ref 27.0–33.0)
MCHC: 34.8 g/dL (ref 32.0–36.0)
MCV: 88.9 fL (ref 80.0–100.0)
MONOS PCT: 8.3 %
MPV: 10.7 fL (ref 7.5–12.5)
NEUTROS ABS: 4043 {cells}/uL (ref 1500–7800)
NEUTROS PCT: 58.6 %
Platelets: 297 10*3/uL (ref 140–400)
RBC: 4.76 10*6/uL (ref 3.80–5.10)
RDW: 12.2 % (ref 11.0–15.0)
Total Lymphocyte: 28.8 %
WBC mixed population: 573 cells/uL (ref 200–950)
WBC: 6.9 10*3/uL (ref 3.8–10.8)

## 2017-01-20 MED ORDER — LIDOCAINE HCL 1 % IJ SOLN
1.5000 mL | INTRAMUSCULAR | Status: AC | PRN
  Administered 2017-01-20: 1.5 mL

## 2017-01-20 MED ORDER — SODIUM HYALURONATE (VISCOSUP) 20 MG/2ML IX SOSY
20.0000 mg | PREFILLED_SYRINGE | INTRA_ARTICULAR | Status: AC | PRN
  Administered 2017-01-20: 20 mg via INTRA_ARTICULAR

## 2017-01-21 ENCOUNTER — Ambulatory Visit
Admission: RE | Admit: 2017-01-21 | Discharge: 2017-01-21 | Disposition: A | Discharge status: Home / Self Care | Payer: Medicare Other | Source: Ambulatory Visit | Attending: Obstetrics & Gynecology | Admitting: Obstetrics & Gynecology

## 2017-01-21 DIAGNOSIS — Z1231 Encounter for screening mammogram for malignant neoplasm of breast: Secondary | ICD-10-CM | POA: Diagnosis not present

## 2017-01-21 LAB — URINALYSIS, ROUTINE W REFLEX MICROSCOPIC
Bilirubin Urine: NEGATIVE
GLUCOSE, UA: NEGATIVE
HGB URINE DIPSTICK: NEGATIVE
Ketones, ur: NEGATIVE
LEUKOCYTES UA: NEGATIVE
NITRITE: NEGATIVE
PROTEIN: NEGATIVE
Specific Gravity, Urine: 1.019 (ref 1.001–1.03)
pH: <=5 (ref 5.0–8.0)

## 2017-01-21 LAB — HM MAMMOGRAPHY

## 2017-01-21 NOTE — Progress Notes (Signed)
Calcium is slightly elevated.  Please instruct patient to discontinue Calcium supplements if she is taking any OTC.  None are listed in medication list. ALT is elevated.  Avoid NSAIDs, tylenol, and alcohol use.

## 2017-01-27 ENCOUNTER — Ambulatory Visit: Payer: Medicare Other | Admitting: Rheumatology

## 2017-01-28 ENCOUNTER — Ambulatory Visit (INDEPENDENT_AMBULATORY_CARE_PROVIDER_SITE_OTHER): Payer: Medicare Other | Admitting: Rheumatology

## 2017-01-28 DIAGNOSIS — M1712 Unilateral primary osteoarthritis, left knee: Secondary | ICD-10-CM | POA: Diagnosis not present

## 2017-01-28 MED ORDER — SODIUM HYALURONATE (VISCOSUP) 20 MG/2ML IX SOSY
20.0000 mg | PREFILLED_SYRINGE | INTRA_ARTICULAR | Status: AC | PRN
  Administered 2017-01-28: 20 mg via INTRA_ARTICULAR

## 2017-01-28 MED ORDER — LIDOCAINE HCL 1 % IJ SOLN
1.5000 mL | INTRAMUSCULAR | Status: AC | PRN
  Administered 2017-01-28: 1.5 mL

## 2017-01-28 NOTE — Progress Notes (Signed)
   Procedure Note  Patient: Savannah [name]             Date of Birth: February 28, 1945           MRN: [id number]             Visit Date: 01/28/2017  Procedures: Visit Diagnoses: Primary osteoarthritis of left knee - Plan: Large Joint Inj: L knee Euflexxa #2 left knee  Large Joint Inj: L knee on 01/28/2017 11:55 AM Indications: pain Details: 27 G 1.5 in needle, medial approach  Arthrogram: No  Medications: 20 mg Sodium Hyaluronate 20 MG/2ML; 1.5 mL lidocaine 1 % Aspirate: 0 mL Outcome: tolerated well, no immediate complications Procedure, treatment alternatives, risks and benefits explained, specific risks discussed. Immediately prior to procedure a time out was called to verify the correct patient, procedure, equipment, support staff and site/side marked as required. Patient was prepped and draped in the usual sterile fashion.      Bo Merino, MD

## 2017-01-29 ENCOUNTER — Encounter: Payer: Self-pay | Admitting: Internal Medicine

## 2017-02-02 ENCOUNTER — Telehealth: Payer: Self-pay

## 2017-02-02 MED ORDER — PITAVASTATIN CALCIUM 2 MG PO TABS: 2.0000 mg | ORAL_TABLET | Freq: Every day | ORAL | 0 refills | Status: DC

## 2017-02-02 NOTE — Telephone Encounter (Signed)
   Bin: [id number] Pcn: [id number] Grp: [id number]  Id: [id number]

## 2017-02-02 NOTE — Telephone Encounter (Signed)
Pt in with spouse today for his appt.   She requested the PCP take over the rx for the Livalo.  Pt informed that she was due for a 6 month follow up.  Pt has scheduled. Rx for 90 day supply to the mail order pharmacy [facility].   Pt stated that Livalo will need a PA.  PA has not been started yet

## 2017-02-02 NOTE — Telephone Encounter (Signed)
Key: [facility]

## 2017-02-03 ENCOUNTER — Ambulatory Visit: Payer: Medicare Other | Admitting: Rheumatology

## 2017-02-04 ENCOUNTER — Ambulatory Visit (INDEPENDENT_AMBULATORY_CARE_PROVIDER_SITE_OTHER): Payer: Medicare Other | Admitting: Rheumatology

## 2017-02-04 DIAGNOSIS — M1712 Unilateral primary osteoarthritis, left knee: Secondary | ICD-10-CM

## 2017-02-04 MED ORDER — SODIUM HYALURONATE (VISCOSUP) 20 MG/2ML IX SOSY
20.0000 mg | PREFILLED_SYRINGE | INTRA_ARTICULAR | Status: AC | PRN
  Administered 2017-02-04: 20 mg via INTRA_ARTICULAR

## 2017-02-04 MED ORDER — LIDOCAINE HCL 1 % IJ SOLN
1.5000 mL | INTRAMUSCULAR | Status: AC | PRN
  Administered 2017-02-04: 1.5 mL

## 2017-02-04 NOTE — Progress Notes (Signed)
   Procedure Note  Patient: Savannah [name]             Date of Birth: 03-14-1945           MRN: [id number]             Visit Date: 02/04/2017  Procedures: Visit Diagnoses: No diagnosis found. Euflexxa #3 left knee  Large Joint Inj: L knee on 02/04/2017 12:45 PM Indications: pain Details: 27 G 1.5 in needle, medial approach  Arthrogram: No  Medications: 20 mg Sodium Hyaluronate 20 MG/2ML; 1.5 mL lidocaine 1 % Aspirate: 0 mL Outcome: tolerated well, no immediate complications Procedure, treatment alternatives, risks and benefits explained, specific risks discussed. Consent was given by the patient. Immediately prior to procedure a time out was called to verify the correct patient, procedure, equipment, support staff and site/side marked as required. Patient was prepped and draped in the usual sterile fashion.      Bo Merino, MD

## 2017-02-05 ENCOUNTER — Encounter: Payer: Self-pay | Admitting: Internal Medicine

## 2017-02-05 ENCOUNTER — Ambulatory Visit (INDEPENDENT_AMBULATORY_CARE_PROVIDER_SITE_OTHER): Payer: Medicare Other | Admitting: Internal Medicine

## 2017-02-05 VITALS — BP 126/64 | HR 83 | Temp 98.0°F | Ht 67.0 in | Wt 193.0 lb

## 2017-02-05 DIAGNOSIS — E785 Hyperlipidemia, unspecified: Secondary | ICD-10-CM

## 2017-02-05 DIAGNOSIS — Z1159 Encounter for screening for other viral diseases: Secondary | ICD-10-CM | POA: Insufficient documentation

## 2017-02-05 DIAGNOSIS — R7989 Other specified abnormal findings of blood chemistry: Secondary | ICD-10-CM

## 2017-02-05 DIAGNOSIS — E781 Pure hyperglyceridemia: Secondary | ICD-10-CM | POA: Diagnosis not present

## 2017-02-05 MED ORDER — ZOSTER VAC RECOMB ADJUVANTED 50 MCG/0.5ML IM SUSR: 0.5000 mL | Freq: Once | INTRAMUSCULAR | 1 refills | Status: AC

## 2017-02-05 MED ORDER — PITAVASTATIN CALCIUM 2 MG PO TABS: 2.0000 mg | ORAL_TABLET | Freq: Every day | ORAL | 1 refills | Status: DC

## 2017-02-05 NOTE — Telephone Encounter (Signed)
PA was approved. Will you inform patient and have them contact pharmacy to refill when needed.

## 2017-02-05 NOTE — Patient Instructions (Signed)

## 2017-02-05 NOTE — Progress Notes (Signed)
Subjective:  Patient ID: Savannah [name], female    DOB: [birth year]  Age: 71 y.o. MRN: [id number]  CC: Hyperlipidemia   HPI AKACIA BOLTZ presents for follow-up on hypercholesterolemia and hypertriglyceridemia.  She is tolerating the statin well with no muscle aches.  She has not made much progress on her lifestyle modifications.  She feels well today and offers no complaints.  Outpatient Medications Prior to Visit  Medication Sig Dispense Refill  . aspirin 81 MG tablet Take 81 mg by mouth.    . Cholecalciferol (VITAMIN D3) 50000 units TABS Take 50,000 Units by mouth once a week.    . furosemide (LASIX) 20 MG tablet Take 1 tablet (20 mg total) by mouth daily as needed for fluid or edema. 90 tablet 3  . loratadine (CLARITIN) 10 MG tablet Take 10 mg by mouth daily as needed for allergies.    Marland Kitchen losartan (COZAAR) 100 MG tablet Take 1 tablet (100 mg total) by mouth at bedtime. 30 tablet 3  . Menaquinone-7 (VITAMIN K2 PO) Take 50 mcg by mouth once a week.    . naproxen sodium (ALEVE) 220 MG tablet Take 220 mg by mouth.    . potassium chloride SA (KLOR-CON M20) 20 MEQ tablet Take 1 tablet (20 mEq total) by mouth daily as needed. When you take Furosemide (Lasix). 90 tablet 3  . vitamin E 400 UNIT capsule Take 400 Units by mouth daily.    . Coenzyme Q10 (CO Q 10) 100 MG CAPS Take 200 mg by mouth.    . diphenhydrAMINE (BENADRYL) 25 mg capsule Take 25 mg by mouth every 6 (six) hours as needed.    . Pitavastatin Calcium (LIVALO) 2 MG TABS Take 1 tablet (2 mg total) by mouth daily. 90 tablet 0   No facility-administered medications prior to visit.     ROS Review of Systems  Constitutional: Negative for diaphoresis, fatigue and unexpected weight change.  HENT: Negative.   Eyes: Negative for visual disturbance.  Respiratory: Negative for cough, chest tightness, shortness of breath and wheezing.   Cardiovascular: Negative for chest pain, palpitations and leg swelling.  Gastrointestinal: Negative for  abdominal pain, constipation, diarrhea, nausea and vomiting.  Endocrine: Negative.  Negative for cold intolerance and heat intolerance.  Genitourinary: Negative.  Negative for difficulty urinating.  Musculoskeletal: Negative.  Negative for back pain, myalgias and neck pain.  Skin: Negative.  Negative for color change and rash.  Allergic/Immunologic: Negative.   Neurological: Negative.  Negative for dizziness.  Hematological: Negative for adenopathy. Does not bruise/bleed easily.  Psychiatric/Behavioral: Negative.     Objective:  BP 126/64 (BP Location: Left Arm, Patient Position: Sitting, Cuff Size: Large)   Pulse 83   Temp 98 F (36.7 C) (Oral)   Ht 5\' 7"  (1.702 m)   Wt 193 lb (87.5 kg)   SpO2 98%   BMI 30.23 kg/m   BP Readings from Last 3 Encounters:  02/05/17 126/64  01/20/17 133/68  12/17/16 130/80    Wt Readings from Last 3 Encounters:  02/05/17 193 lb (87.5 kg)  01/20/17 196 lb (88.9 kg)  12/17/16 193 lb (87.5 kg)    Physical Exam  Constitutional: She is oriented to person, place, and time. No distress.  HENT:  Mouth/Throat: Oropharynx is clear and moist. No oropharyngeal exudate.  Eyes: Conjunctivae are normal. Left eye exhibits no discharge. No scleral icterus.  Neck: Normal range of motion. Neck supple. No JVD present. No thyromegaly present.  Cardiovascular: Normal rate, regular rhythm and  normal heart sounds.  No murmur heard. Pulmonary/Chest: Effort normal and breath sounds normal. No respiratory distress. She has no wheezes. She has no rales.  Abdominal: Soft. Bowel sounds are normal. She exhibits no distension and no mass. There is no tenderness. There is no guarding.  Musculoskeletal: Normal range of motion. She exhibits no edema or tenderness.  Neurological: She is alert and oriented to person, place, and time.  Skin: Skin is warm and dry. No rash noted. She is not diaphoretic. No erythema. No pallor.  Vitals reviewed.   Lab Results  Component Value  Date   WBC 6.9 01/20/2017   HGB 14.7 01/20/2017   HCT 42.3 01/20/2017   PLT 297 01/20/2017   GLUCOSE 108 (H) 02/06/2017   CHOL 189 02/06/2017   TRIG (H) 02/06/2017    669.0 Triglyceride is over 400; calculations on Lipids are invalid.   HDL 31.30 (L) 02/06/2017   LDLDIRECT 55.0 02/06/2017   LDLCALC 85 11/16/2015   ALT 24 02/06/2017   AST 18 02/06/2017   NA 139 02/06/2017   K 4.2 02/06/2017   CL 103 02/06/2017   CREATININE 0.94 02/06/2017   BUN 23 02/06/2017   CO2 29 02/06/2017   TSH 5.88 (H) 02/06/2017    Mm Screening Breast Tomo Bilateral  Result Date: 01/21/2017 CLINICAL DATA:  Screening. EXAM: 2D DIGITAL SCREENING BILATERAL MAMMOGRAM WITH CAD AND ADJUNCT TOMO COMPARISON:  Previous exam(s). ACR Breast Density Category b: There are scattered areas of fibroglandular density. FINDINGS: There are no findings suspicious for malignancy. Images were processed with CAD. IMPRESSION: No mammographic evidence of malignancy. A result letter of this screening mammogram will be mailed directly to the patient. RECOMMENDATION: Screening mammogram in one year. (Code:SM-B-01Y) BI-RADS CATEGORY  1: Negative. Electronically Signed   By: Margarette Canada M.D.   On: 01/21/2017 15:06    Assessment & Plan:   Marylu was seen today for hyperlipidemia.  Diagnoses and all orders for this visit:  Pure hyperglyceridemia- her triglycerides are elevated up to 669.  Will start a fish oil supplement to lower her triglycerides and to reduce complications. -     Comprehensive metabolic panel; Future -     Lipid panel; Future -     Icosapent Ethyl (VASCEPA) 1 g CAPS; Take 2 capsules by mouth 2 (two) times daily.  Dyslipidemia, goal LDL below 160- she has achieved her LDL goal and is doing well on the statin. -     Comprehensive metabolic panel; Future -     Lipid panel; Future -     TSH; Future  Need for hepatitis C screening test -     Hepatitis C antibody; Future  TSH elevation- She has a very mild elevation  in her TSH but she is asymptomatic.  Treatment of this is not indicated.  I will continue to monitor this in the future and she will let me know if she develops any symptoms.  Other orders -     Pitavastatin Calcium (LIVALO) 2 MG TABS; Take 1 tablet (2 mg total) by mouth daily. -     Zoster Vaccine Adjuvanted [facility]) injection; Inject 0.5 mLs into the muscle once for 1 dose.   I have discontinued Makia A. Grogan's Co Q 10 and diphenhydrAMINE. I am also having her start on Zoster Vaccine Adjuvanted and Icosapent Ethyl. Additionally, I am having her maintain her aspirin, vitamin E, Menaquinone-7 (VITAMIN K2 PO), Vitamin D3, potassium chloride SA, furosemide, loratadine, losartan, naproxen sodium, and Pitavastatin Calcium.  Meds  ordered this encounter  Medications  . Pitavastatin Calcium (LIVALO) 2 MG TABS    Sig: Take 1 tablet (2 mg total) by mouth daily.    Dispense:  90 tablet    Refill:  1    Disregard previous prescription. PA was approved.  . Zoster Vaccine Adjuvanted [facility]) injection    Sig: Inject 0.5 mLs into the muscle once for 1 dose.    Dispense:  0.5 mL    Refill:  1  . Icosapent Ethyl (VASCEPA) 1 g CAPS    Sig: Take 2 capsules by mouth 2 (two) times daily.    Dispense:  360 capsule    Refill:  1     Follow-up: Return in about 6 months (around 08/06/2017).  Scarlette Calico, MD

## 2017-02-05 NOTE — Telephone Encounter (Signed)
I have informed patient. She did ask for refills on the medication. I informed its for 39months. She has an appointment today at 330p and stated she would speak with him then about it.

## 2017-02-06 ENCOUNTER — Other Ambulatory Visit (INDEPENDENT_AMBULATORY_CARE_PROVIDER_SITE_OTHER): Payer: Medicare Other

## 2017-02-06 DIAGNOSIS — E785 Hyperlipidemia, unspecified: Secondary | ICD-10-CM

## 2017-02-06 DIAGNOSIS — E781 Pure hyperglyceridemia: Secondary | ICD-10-CM

## 2017-02-06 DIAGNOSIS — Z1159 Encounter for screening for other viral diseases: Secondary | ICD-10-CM

## 2017-02-06 LAB — LIPID PANEL
Cholesterol: 189 mg/dL (ref 0–200)
HDL: 31.3 mg/dL — ABNORMAL LOW (ref >39.00)
Total CHOL/HDL Ratio: 6

## 2017-02-06 LAB — COMPREHENSIVE METABOLIC PANEL
ALBUMIN: 4.2 g/dL (ref 3.5–5.2)
ALT: 24 U/L (ref 0–35)
AST: 18 U/L (ref 0–37)
Alkaline Phosphatase: 64 U/L (ref 39–117)
BUN: 23 mg/dL (ref 6–23)
CHLORIDE: 103 meq/L (ref 96–112)
CO2: 29 mEq/L (ref 19–32)
CREATININE: 0.94 mg/dL (ref 0.40–1.20)
Calcium: 9.6 mg/dL (ref 8.4–10.5)
GFR: 62.14 mL/min (ref >60.00)
Glucose, Bld: 108 mg/dL — ABNORMAL HIGH (ref 70–99)
Potassium: 4.2 mEq/L (ref 3.5–5.1)
SODIUM: 139 meq/L (ref 135–145)
Total Bilirubin: 1 mg/dL (ref 0.2–1.2)
Total Protein: 6.8 g/dL (ref 6.0–8.3)

## 2017-02-06 LAB — LDL CHOLESTEROL, DIRECT: LDL DIRECT: 55 mg/dL

## 2017-02-06 LAB — TSH: TSH: 5.88 u[IU]/mL — ABNORMAL HIGH (ref 0.35–4.50)

## 2017-02-07 ENCOUNTER — Encounter: Payer: Self-pay | Admitting: Internal Medicine

## 2017-02-07 DIAGNOSIS — R7989 Other specified abnormal findings of blood chemistry: Secondary | ICD-10-CM | POA: Insufficient documentation

## 2017-02-07 LAB — HEPATITIS C ANTIBODY
Hepatitis C Ab: NONREACTIVE
SIGNAL TO CUT-OFF: 0.01 (ref <1.00)

## 2017-02-07 MED ORDER — ICOSAPENT ETHYL 1 G PO CAPS: 2.0000 | ORAL_CAPSULE | Freq: Two times a day (BID) | ORAL | 1 refills | Status: DC

## 2017-02-26 ENCOUNTER — Other Ambulatory Visit (HOSPITAL_COMMUNITY): Payer: Self-pay | Admitting: Student

## 2017-05-19 DIAGNOSIS — J439 Emphysema, unspecified: Secondary | ICD-10-CM | POA: Diagnosis not present

## 2017-05-19 DIAGNOSIS — Z5189 Encounter for other specified aftercare: Secondary | ICD-10-CM | POA: Diagnosis not present

## 2017-05-19 DIAGNOSIS — J849 Interstitial pulmonary disease, unspecified: Secondary | ICD-10-CM | POA: Diagnosis not present

## 2017-05-21 DIAGNOSIS — J849 Interstitial pulmonary disease, unspecified: Secondary | ICD-10-CM | POA: Diagnosis not present

## 2017-05-21 DIAGNOSIS — Z5189 Encounter for other specified aftercare: Secondary | ICD-10-CM | POA: Diagnosis not present

## 2017-05-21 DIAGNOSIS — J439 Emphysema, unspecified: Secondary | ICD-10-CM | POA: Diagnosis not present

## 2017-05-26 DIAGNOSIS — Z5189 Encounter for other specified aftercare: Secondary | ICD-10-CM | POA: Diagnosis not present

## 2017-05-26 DIAGNOSIS — J439 Emphysema, unspecified: Secondary | ICD-10-CM | POA: Diagnosis not present

## 2017-05-26 DIAGNOSIS — J849 Interstitial pulmonary disease, unspecified: Secondary | ICD-10-CM | POA: Diagnosis not present

## 2017-05-28 ENCOUNTER — Telehealth: Payer: Self-pay

## 2017-05-28 DIAGNOSIS — J439 Emphysema, unspecified: Secondary | ICD-10-CM | POA: Diagnosis not present

## 2017-05-28 DIAGNOSIS — Z5189 Encounter for other specified aftercare: Secondary | ICD-10-CM | POA: Diagnosis not present

## 2017-05-28 DIAGNOSIS — J849 Interstitial pulmonary disease, unspecified: Secondary | ICD-10-CM | POA: Diagnosis not present

## 2017-05-28 NOTE — Telephone Encounter (Signed)
Key: [id number]   Livalo approved today via Cover My Meds.

## 2017-06-02 DIAGNOSIS — J439 Emphysema, unspecified: Secondary | ICD-10-CM | POA: Diagnosis not present

## 2017-06-02 DIAGNOSIS — J849 Interstitial pulmonary disease, unspecified: Secondary | ICD-10-CM | POA: Diagnosis not present

## 2017-06-02 DIAGNOSIS — Z5189 Encounter for other specified aftercare: Secondary | ICD-10-CM | POA: Diagnosis not present

## 2017-06-04 DIAGNOSIS — J439 Emphysema, unspecified: Secondary | ICD-10-CM | POA: Diagnosis not present

## 2017-06-04 DIAGNOSIS — J849 Interstitial pulmonary disease, unspecified: Secondary | ICD-10-CM | POA: Diagnosis not present

## 2017-06-04 DIAGNOSIS — Z5189 Encounter for other specified aftercare: Secondary | ICD-10-CM | POA: Diagnosis not present

## 2017-06-09 DIAGNOSIS — J849 Interstitial pulmonary disease, unspecified: Secondary | ICD-10-CM | POA: Diagnosis not present

## 2017-06-09 DIAGNOSIS — Z5189 Encounter for other specified aftercare: Secondary | ICD-10-CM | POA: Diagnosis not present

## 2017-06-09 DIAGNOSIS — J439 Emphysema, unspecified: Secondary | ICD-10-CM | POA: Diagnosis not present

## 2017-06-11 DIAGNOSIS — J439 Emphysema, unspecified: Secondary | ICD-10-CM | POA: Diagnosis not present

## 2017-06-11 DIAGNOSIS — Z5189 Encounter for other specified aftercare: Secondary | ICD-10-CM | POA: Diagnosis not present

## 2017-06-11 DIAGNOSIS — J849 Interstitial pulmonary disease, unspecified: Secondary | ICD-10-CM | POA: Diagnosis not present

## 2017-06-16 DIAGNOSIS — Z5189 Encounter for other specified aftercare: Secondary | ICD-10-CM | POA: Diagnosis not present

## 2017-06-16 DIAGNOSIS — J439 Emphysema, unspecified: Secondary | ICD-10-CM | POA: Diagnosis not present

## 2017-06-16 DIAGNOSIS — J849 Interstitial pulmonary disease, unspecified: Secondary | ICD-10-CM | POA: Diagnosis not present

## 2017-06-18 DIAGNOSIS — J439 Emphysema, unspecified: Secondary | ICD-10-CM | POA: Diagnosis not present

## 2017-06-18 DIAGNOSIS — Z5189 Encounter for other specified aftercare: Secondary | ICD-10-CM | POA: Diagnosis not present

## 2017-06-18 DIAGNOSIS — J849 Interstitial pulmonary disease, unspecified: Secondary | ICD-10-CM | POA: Diagnosis not present

## 2017-06-23 DIAGNOSIS — J849 Interstitial pulmonary disease, unspecified: Secondary | ICD-10-CM | POA: Diagnosis not present

## 2017-06-23 DIAGNOSIS — J439 Emphysema, unspecified: Secondary | ICD-10-CM | POA: Diagnosis not present

## 2017-06-23 DIAGNOSIS — Z5189 Encounter for other specified aftercare: Secondary | ICD-10-CM | POA: Diagnosis not present

## 2017-06-25 DIAGNOSIS — Z5189 Encounter for other specified aftercare: Secondary | ICD-10-CM | POA: Diagnosis not present

## 2017-06-25 DIAGNOSIS — J439 Emphysema, unspecified: Secondary | ICD-10-CM | POA: Diagnosis not present

## 2017-06-25 DIAGNOSIS — J849 Interstitial pulmonary disease, unspecified: Secondary | ICD-10-CM | POA: Diagnosis not present

## 2017-06-30 DIAGNOSIS — Z5189 Encounter for other specified aftercare: Secondary | ICD-10-CM | POA: Diagnosis not present

## 2017-06-30 DIAGNOSIS — J439 Emphysema, unspecified: Secondary | ICD-10-CM | POA: Diagnosis not present

## 2017-06-30 DIAGNOSIS — J849 Interstitial pulmonary disease, unspecified: Secondary | ICD-10-CM | POA: Diagnosis not present

## 2017-07-02 DIAGNOSIS — J849 Interstitial pulmonary disease, unspecified: Secondary | ICD-10-CM | POA: Diagnosis not present

## 2017-07-02 DIAGNOSIS — Z5189 Encounter for other specified aftercare: Secondary | ICD-10-CM | POA: Diagnosis not present

## 2017-07-02 DIAGNOSIS — J439 Emphysema, unspecified: Secondary | ICD-10-CM | POA: Diagnosis not present

## 2017-07-07 DIAGNOSIS — J439 Emphysema, unspecified: Secondary | ICD-10-CM | POA: Diagnosis not present

## 2017-07-07 DIAGNOSIS — J849 Interstitial pulmonary disease, unspecified: Secondary | ICD-10-CM | POA: Diagnosis not present

## 2017-07-07 DIAGNOSIS — Z5189 Encounter for other specified aftercare: Secondary | ICD-10-CM | POA: Diagnosis not present

## 2017-07-09 DIAGNOSIS — Z5189 Encounter for other specified aftercare: Secondary | ICD-10-CM | POA: Diagnosis not present

## 2017-07-09 DIAGNOSIS — J849 Interstitial pulmonary disease, unspecified: Secondary | ICD-10-CM | POA: Diagnosis not present

## 2017-07-09 DIAGNOSIS — J439 Emphysema, unspecified: Secondary | ICD-10-CM | POA: Diagnosis not present

## 2017-07-14 DIAGNOSIS — J849 Interstitial pulmonary disease, unspecified: Secondary | ICD-10-CM | POA: Diagnosis not present

## 2017-07-14 DIAGNOSIS — J439 Emphysema, unspecified: Secondary | ICD-10-CM | POA: Diagnosis not present

## 2017-07-14 DIAGNOSIS — Z5189 Encounter for other specified aftercare: Secondary | ICD-10-CM | POA: Diagnosis not present

## 2017-07-16 DIAGNOSIS — Z5189 Encounter for other specified aftercare: Secondary | ICD-10-CM | POA: Diagnosis not present

## 2017-07-16 DIAGNOSIS — J439 Emphysema, unspecified: Secondary | ICD-10-CM | POA: Diagnosis not present

## 2017-07-16 DIAGNOSIS — J849 Interstitial pulmonary disease, unspecified: Secondary | ICD-10-CM | POA: Diagnosis not present

## 2017-07-16 NOTE — Progress Notes (Deleted)
Office Visit Note  Patient: Savannah [name]             Date of Birth: Jul 03, 1945           MRN: [id number]             PCP: Janith Lima, MD Referring: Janith Lima, MD Visit Date: 07/28/2017 Occupation: @GUAROCC @    Subjective:  No chief complaint on file.   History of Present Illness: Savannah [name] is a 72 y.o. female ***   Activities of Daily Living:  Patient reports morning stiffness for *** {minute/hour:19697}.   Patient {ACTIONS;DENIES/REPORTS:21021675::"Denies"} nocturnal pain.  Difficulty dressing/grooming: {ACTIONS;DENIES/REPORTS:21021675::"Denies"} Difficulty climbing stairs: {ACTIONS;DENIES/REPORTS:21021675::"Denies"} Difficulty getting out of chair: {ACTIONS;DENIES/REPORTS:21021675::"Denies"} Difficulty using hands for taps, buttons, cutlery, and/or writing: {ACTIONS;DENIES/REPORTS:21021675::"Denies"}   No Rheumatology ROS completed.   PMFS History:  Patient Active Problem List   Diagnosis Date Noted  . TSH elevation 02/07/2017  . Need for hepatitis C screening test 02/05/2017  . Raynaud's syndrome without gangrene 07/02/2016  . Osteopenia of multiple sites 07/02/2016  . Vitamin D deficiency 07/02/2016  . Primary osteoarthritis of both knees 07/02/2016  . Primary osteoarthritis of both hands 07/02/2016  . Primary osteoarthritis of both feet 07/02/2016  . History of diverticulosis 07/02/2016  . Pure hyperglyceridemia 12/27/2015  . Hyperglycemia 12/27/2015  . ILD (interstitial lung disease) ([place]) 12/10/2015  . Diastolic dysfunction 62/95/2841  . Edema of lower extremity 08/18/2014  . HTN (hypertension) 10/07/2011  . Dyslipidemia, goal LDL below 160 10/07/2011  . Scleroderma ([place]) 10/07/2011  . Varicose veins 10/07/2011    Past Medical History:  Diagnosis Date  . Dyspnea    MYOVIEW, 12/29/2006 - post-stress EF 81%,no ECG changes, EKG negative for ischemia  . Hyperlipidemia   . Hypertension    2D ECHO, 12/24/2006 - EF 45-55%, normal  . Swelling of  extremity    LEA VENOUS DUPLEX, 08/19/2011 - deep valvular insufficiency noted with right femoral and popliteal veins and left common femoral vein  . TIA (transient ischemic attack)    CAROTID DOPPLER, 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    Family History  Problem Relation Age of Onset  . Hyperlipidemia Father   . Cirrhosis Father 15  . Kidney failure Father 84  . Stroke Maternal Grandmother 85  . Hypertension Maternal Grandmother 40  . Angina Maternal Grandfather 16  . Heart attack Maternal Grandfather 88  . Lung disease Paternal Grandmother   . Atrial fibrillation Mother 39  . Hypertension Mother    No past surgical history on file. Social History   Social History Narrative  . Not on file     Objective: Vital Signs: There were no vitals taken for this visit.   Physical Exam   Musculoskeletal Exam: ***  CDAI Exam: No CDAI exam completed.    Investigation: No additional findings. CBC Latest Ref Rng & Units 01/20/2017 07/10/2016 11/16/2015  WBC 3.8 - 10.8 Thousand/uL 6.9 7.1 6.1  Hemoglobin 11.7 - 15.5 g/dL 14.7 14.4 14.4  Hematocrit 35.0 - 45.0 % 42.3 42.6 91(A)  Platelets 140 - 400 Thousand/uL 297 284 258   CMP Latest Ref Rng & Units 02/06/2017 01/20/2017 10/02/2016  Glucose 70 - 99 mg/dL 108(H) 91 110(H)  BUN 6 - 23 mg/dL 23 17 16   Creatinine 0.40 - 1.20 mg/dL 0.94 0.90 0.87  Sodium 135 - 145 mEq/L 139 142 138  Potassium 3.5 - 5.1 mEq/L 4.2 4.7 4.2  Chloride 96 - 112 mEq/L 103 106  106  CO2 19 - 32 mEq/L 29 29 24   Calcium 8.4 - 10.5 mg/dL 9.6 10.7(H) 9.8  Total Protein 6.0 - 8.3 g/dL 6.8 6.8 -  Total Bilirubin 0.2 - 1.2 mg/dL 1.0 1.1 -  Alkaline Phos 39 - 117 U/L 64 - -  AST 0 - 37 U/L 18 26 -  ALT 0 - 35 U/L 24 34(H) -    Imaging: No results found.  Speciality Comments: No specialty comments available.    Procedures:  No procedures performed Allergies: Codeine; Cymbalta [duloxetine hcl]; Lyrica [pregabalin]; Penicillins;  Sulfa antibiotics; Zetia [ezetimibe]; and Zocor [simvastatin]   Assessment / Plan:     Visit Diagnoses: No diagnosis found.    Orders: No orders of the defined types were placed in this encounter.  No orders of the defined types were placed in this encounter.   Face-to-face time spent with patient was *** minutes. 50% of time was spent in counseling and coordination of care.  Follow-Up Instructions: No follow-ups on file.   Earnestine Mealing, CMA  Note - This record has been created using Editor, commissioning.  Chart creation errors have been sought, but may not always  have been located. Such creation errors do not reflect on  the standard of medical care.

## 2017-07-21 DIAGNOSIS — Z5189 Encounter for other specified aftercare: Secondary | ICD-10-CM | POA: Diagnosis not present

## 2017-07-21 DIAGNOSIS — J849 Interstitial pulmonary disease, unspecified: Secondary | ICD-10-CM | POA: Diagnosis not present

## 2017-07-21 DIAGNOSIS — J439 Emphysema, unspecified: Secondary | ICD-10-CM | POA: Diagnosis not present

## 2017-07-23 ENCOUNTER — Ambulatory Visit: Payer: Medicare Other | Admitting: Rheumatology

## 2017-07-23 DIAGNOSIS — J849 Interstitial pulmonary disease, unspecified: Secondary | ICD-10-CM | POA: Diagnosis not present

## 2017-07-23 DIAGNOSIS — J439 Emphysema, unspecified: Secondary | ICD-10-CM | POA: Diagnosis not present

## 2017-07-23 DIAGNOSIS — Z5189 Encounter for other specified aftercare: Secondary | ICD-10-CM | POA: Diagnosis not present

## 2017-07-28 ENCOUNTER — Ambulatory Visit: Payer: Medicare Other | Admitting: Rheumatology

## 2017-07-28 DIAGNOSIS — J849 Interstitial pulmonary disease, unspecified: Secondary | ICD-10-CM | POA: Diagnosis not present

## 2017-07-28 DIAGNOSIS — Z5189 Encounter for other specified aftercare: Secondary | ICD-10-CM | POA: Diagnosis not present

## 2017-07-28 DIAGNOSIS — J439 Emphysema, unspecified: Secondary | ICD-10-CM | POA: Diagnosis not present

## 2017-07-29 ENCOUNTER — Other Ambulatory Visit (HOSPITAL_COMMUNITY): Payer: Self-pay | Admitting: Internal Medicine

## 2017-07-30 DIAGNOSIS — J849 Interstitial pulmonary disease, unspecified: Secondary | ICD-10-CM | POA: Diagnosis not present

## 2017-07-30 DIAGNOSIS — J439 Emphysema, unspecified: Secondary | ICD-10-CM | POA: Diagnosis not present

## 2017-07-30 DIAGNOSIS — Z5189 Encounter for other specified aftercare: Secondary | ICD-10-CM | POA: Diagnosis not present

## 2017-08-04 DIAGNOSIS — J849 Interstitial pulmonary disease, unspecified: Secondary | ICD-10-CM | POA: Diagnosis not present

## 2017-08-04 DIAGNOSIS — J439 Emphysema, unspecified: Secondary | ICD-10-CM | POA: Diagnosis not present

## 2017-08-04 DIAGNOSIS — Z5189 Encounter for other specified aftercare: Secondary | ICD-10-CM | POA: Diagnosis not present

## 2017-08-06 DIAGNOSIS — J849 Interstitial pulmonary disease, unspecified: Secondary | ICD-10-CM | POA: Diagnosis not present

## 2017-08-06 DIAGNOSIS — J439 Emphysema, unspecified: Secondary | ICD-10-CM | POA: Diagnosis not present

## 2017-08-06 DIAGNOSIS — Z5189 Encounter for other specified aftercare: Secondary | ICD-10-CM | POA: Diagnosis not present

## 2017-08-09 ENCOUNTER — Telehealth: Payer: Self-pay | Admitting: Internal Medicine

## 2017-08-09 ENCOUNTER — Encounter: Payer: Self-pay | Admitting: Internal Medicine

## 2017-08-09 NOTE — Telephone Encounter (Signed)
Savannah [name] has scleroderma ILD with PFT progression Oct 2018. Recommendaton was to see me in June/July after spiro with DLCO but I see no appt.   Plan - give her first available ILD clinic - only ILD clinic (not any 30 min slot) - prior to ov she should have Pre-bd spiro and dlco only. No lung volume or bd response. No post-bd spiro - can be done day off  Thanks  Dr. Brand Males, M.D., [facility].C.P Pulmonary and Critical Care Medicine Staff Physician, [place] Director - Interstitial Lung Disease  Program  Pulmonary [place] at [place], [place], [address]  Pager: [phone], If no answer or between  15:00h - 7:00h: call [phone] Telephone: [phone]

## 2017-08-11 DIAGNOSIS — J849 Interstitial pulmonary disease, unspecified: Secondary | ICD-10-CM | POA: Diagnosis not present

## 2017-08-11 DIAGNOSIS — Z5189 Encounter for other specified aftercare: Secondary | ICD-10-CM | POA: Diagnosis not present

## 2017-08-11 DIAGNOSIS — J439 Emphysema, unspecified: Secondary | ICD-10-CM | POA: Diagnosis not present

## 2017-08-11 NOTE — Telephone Encounter (Signed)
Attempted to call pt so I could get her appt scheduled but unable to reach pt.  Left a message for pt to return call.  Pt needs a 8min OV in ILD clinic with 82min PFT before.

## 2017-08-13 DIAGNOSIS — J439 Emphysema, unspecified: Secondary | ICD-10-CM | POA: Diagnosis not present

## 2017-08-13 DIAGNOSIS — J849 Interstitial pulmonary disease, unspecified: Secondary | ICD-10-CM | POA: Diagnosis not present

## 2017-08-13 DIAGNOSIS — Z5189 Encounter for other specified aftercare: Secondary | ICD-10-CM | POA: Diagnosis not present

## 2017-08-17 NOTE — Telephone Encounter (Signed)
Called and spoke with pt to see if I could schedule f/u appt for her to see MR.  Pt stated she is currently still in [place] and won't be back in [place] until at least September.  Pt will call office once she is back in [place] so that way we can schedule her f/u appt.  Pt will need 36mn PFT with a 48min OV in ILD clinic.  Will place reminder in pt's chart so that way when she does call our office back, the appts can be scheduled for her.  Since recall has been placed, will close encounter.

## 2017-08-18 DIAGNOSIS — J439 Emphysema, unspecified: Secondary | ICD-10-CM | POA: Diagnosis not present

## 2017-08-18 DIAGNOSIS — J849 Interstitial pulmonary disease, unspecified: Secondary | ICD-10-CM | POA: Diagnosis not present

## 2017-08-18 DIAGNOSIS — Z5189 Encounter for other specified aftercare: Secondary | ICD-10-CM | POA: Diagnosis not present

## 2017-08-25 DIAGNOSIS — Z5189 Encounter for other specified aftercare: Secondary | ICD-10-CM | POA: Diagnosis not present

## 2017-08-25 DIAGNOSIS — J849 Interstitial pulmonary disease, unspecified: Secondary | ICD-10-CM | POA: Diagnosis not present

## 2017-08-25 DIAGNOSIS — J439 Emphysema, unspecified: Secondary | ICD-10-CM | POA: Diagnosis not present

## 2017-08-27 DIAGNOSIS — J849 Interstitial pulmonary disease, unspecified: Secondary | ICD-10-CM | POA: Diagnosis not present

## 2017-08-27 DIAGNOSIS — J439 Emphysema, unspecified: Secondary | ICD-10-CM | POA: Diagnosis not present

## 2017-08-27 DIAGNOSIS — Z5189 Encounter for other specified aftercare: Secondary | ICD-10-CM | POA: Diagnosis not present

## 2017-09-01 DIAGNOSIS — J439 Emphysema, unspecified: Secondary | ICD-10-CM | POA: Diagnosis not present

## 2017-09-01 DIAGNOSIS — J849 Interstitial pulmonary disease, unspecified: Secondary | ICD-10-CM | POA: Diagnosis not present

## 2017-09-01 DIAGNOSIS — Z5189 Encounter for other specified aftercare: Secondary | ICD-10-CM | POA: Diagnosis not present

## 2017-09-03 DIAGNOSIS — J439 Emphysema, unspecified: Secondary | ICD-10-CM | POA: Diagnosis not present

## 2017-09-03 DIAGNOSIS — Z5189 Encounter for other specified aftercare: Secondary | ICD-10-CM | POA: Diagnosis not present

## 2017-09-03 DIAGNOSIS — J849 Interstitial pulmonary disease, unspecified: Secondary | ICD-10-CM | POA: Diagnosis not present

## 2017-09-08 DIAGNOSIS — J849 Interstitial pulmonary disease, unspecified: Secondary | ICD-10-CM | POA: Diagnosis not present

## 2017-09-08 DIAGNOSIS — J439 Emphysema, unspecified: Secondary | ICD-10-CM | POA: Diagnosis not present

## 2017-09-08 DIAGNOSIS — Z5189 Encounter for other specified aftercare: Secondary | ICD-10-CM | POA: Diagnosis not present

## 2017-09-10 DIAGNOSIS — Z5189 Encounter for other specified aftercare: Secondary | ICD-10-CM | POA: Diagnosis not present

## 2017-09-10 DIAGNOSIS — J849 Interstitial pulmonary disease, unspecified: Secondary | ICD-10-CM | POA: Diagnosis not present

## 2017-09-10 DIAGNOSIS — J439 Emphysema, unspecified: Secondary | ICD-10-CM | POA: Diagnosis not present

## 2017-09-15 DIAGNOSIS — Z5189 Encounter for other specified aftercare: Secondary | ICD-10-CM | POA: Diagnosis not present

## 2017-09-15 DIAGNOSIS — J439 Emphysema, unspecified: Secondary | ICD-10-CM | POA: Diagnosis not present

## 2017-09-15 DIAGNOSIS — J849 Interstitial pulmonary disease, unspecified: Secondary | ICD-10-CM | POA: Diagnosis not present

## 2017-09-17 DIAGNOSIS — J439 Emphysema, unspecified: Secondary | ICD-10-CM | POA: Diagnosis not present

## 2017-09-17 DIAGNOSIS — J849 Interstitial pulmonary disease, unspecified: Secondary | ICD-10-CM | POA: Diagnosis not present

## 2017-09-17 DIAGNOSIS — Z5189 Encounter for other specified aftercare: Secondary | ICD-10-CM | POA: Diagnosis not present

## 2017-11-24 ENCOUNTER — Ambulatory Visit (INDEPENDENT_AMBULATORY_CARE_PROVIDER_SITE_OTHER): Payer: Medicare Other | Admitting: Rheumatology

## 2017-11-24 ENCOUNTER — Telehealth (INDEPENDENT_AMBULATORY_CARE_PROVIDER_SITE_OTHER): Payer: Self-pay | Admitting: *Deleted

## 2017-11-24 ENCOUNTER — Encounter: Payer: Self-pay | Admitting: Rheumatology

## 2017-11-24 ENCOUNTER — Telehealth (INDEPENDENT_AMBULATORY_CARE_PROVIDER_SITE_OTHER): Payer: Self-pay

## 2017-11-24 VITALS — BP 116/52 | HR 84 | Resp 14 | Ht 67.0 in | Wt 181.2 lb

## 2017-11-24 DIAGNOSIS — Z8679 Personal history of other diseases of the circulatory system: Secondary | ICD-10-CM | POA: Diagnosis not present

## 2017-11-24 DIAGNOSIS — J849 Interstitial pulmonary disease, unspecified: Secondary | ICD-10-CM | POA: Diagnosis not present

## 2017-11-24 DIAGNOSIS — M19071 Primary osteoarthritis, right ankle and foot: Secondary | ICD-10-CM | POA: Diagnosis not present

## 2017-11-24 DIAGNOSIS — Z8639 Personal history of other endocrine, nutritional and metabolic disease: Secondary | ICD-10-CM | POA: Diagnosis not present

## 2017-11-24 DIAGNOSIS — G5603 Carpal tunnel syndrome, bilateral upper limbs: Secondary | ICD-10-CM

## 2017-11-24 DIAGNOSIS — M170 Bilateral primary osteoarthritis of knee: Secondary | ICD-10-CM

## 2017-11-24 DIAGNOSIS — M8589 Other specified disorders of bone density and structure, multiple sites: Secondary | ICD-10-CM

## 2017-11-24 DIAGNOSIS — I7300 Raynaud's syndrome without gangrene: Secondary | ICD-10-CM

## 2017-11-24 DIAGNOSIS — M19041 Primary osteoarthritis, right hand: Secondary | ICD-10-CM

## 2017-11-24 DIAGNOSIS — R5381 Other malaise: Secondary | ICD-10-CM

## 2017-11-24 DIAGNOSIS — M349 Systemic sclerosis, unspecified: Secondary | ICD-10-CM

## 2017-11-24 DIAGNOSIS — Z8719 Personal history of other diseases of the digestive system: Secondary | ICD-10-CM | POA: Diagnosis not present

## 2017-11-24 DIAGNOSIS — M19042 Primary osteoarthritis, left hand: Secondary | ICD-10-CM

## 2017-11-24 DIAGNOSIS — M19072 Primary osteoarthritis, left ankle and foot: Secondary | ICD-10-CM

## 2017-11-24 NOTE — Telephone Encounter (Signed)
Submitted VOB for Euflexxa injection series, bilateral knee.

## 2017-11-24 NOTE — Telephone Encounter (Signed)
Noted  

## 2017-11-24 NOTE — Progress Notes (Signed)
Office Visit Note  Patient: Savannah [name]             Date of Birth: 1946/01/21           MRN: [id number]             PCP: Janith Lima, MD Referring: Janith Lima, MD Visit Date: 11/24/2017 Occupation: @GUAROCC @  Subjective:  Pain in joints.   History of Present Illness: Savannah [name] is a 72 y.o. female with history of a scleroderma, interstitial lung disease and osteoarthritis.  She continues to have some pain and discomfort in her bilateral elbows, bilateral hands, bilateral knee joints and her feet.  She notices intermittent swelling in her left knee joint.  She believes the skin tightness is worse in her right hand.  The Raynauds persist.  She has no increased difficulty with shortness of breath.  She has appointment coming up with Dr. Chase Caller in couple of weeks.  She complains of paresthesias in bilateral first second and third digits.  She states her symptoms get worse after driving.  She does not want to have nerve conduction velocities.  Activities of Daily Living:  Patient reports morning stiffness for 15 minutes.   Patient Reports nocturnal pain.  Difficulty dressing/grooming: Denies Difficulty climbing stairs: Denies Difficulty getting out of chair: Denies Difficulty using hands for taps, buttons, cutlery, and/or writing: Denies  Review of Systems  Constitutional: Positive for fatigue. Negative for night sweats, weight gain and weight loss.  HENT: Negative for mouth sores, trouble swallowing, trouble swallowing, mouth dryness and nose dryness.   Eyes: Positive for dryness. Negative for pain, redness and visual disturbance.  Respiratory: Negative for cough, shortness of breath and difficulty breathing.   Cardiovascular: Negative for chest pain, palpitations, hypertension, irregular heartbeat and swelling in legs/feet.  Gastrointestinal: Negative for blood in stool, constipation and diarrhea.  Endocrine: Negative for increased urination.  Genitourinary: Negative for  vaginal dryness.  Musculoskeletal: Positive for arthralgias, joint pain and morning stiffness. Negative for joint swelling, myalgias, muscle weakness, muscle tenderness and myalgias.  Skin: Positive for color change. Negative for rash, hair loss, skin tightness, ulcers and sensitivity to sunlight.  Allergic/Immunologic: Negative for susceptible to infections.  Neurological: Negative for dizziness, memory loss, night sweats and weakness.  Hematological: Negative for swollen glands.  Psychiatric/Behavioral: Positive for sleep disturbance. Negative for depressed mood. The patient is not nervous/anxious.     PMFS History:  Patient Active Problem List   Diagnosis Date Noted  . TSH elevation 02/07/2017  . Need for hepatitis C screening test 02/05/2017  . Raynaud's syndrome without gangrene 07/02/2016  . Osteopenia of multiple sites 07/02/2016  . Vitamin D deficiency 07/02/2016  . Primary osteoarthritis of both knees 07/02/2016  . Primary osteoarthritis of both hands 07/02/2016  . Primary osteoarthritis of both feet 07/02/2016  . History of diverticulosis 07/02/2016  . Pure hyperglyceridemia 12/27/2015  . Hyperglycemia 12/27/2015  . ILD (interstitial lung disease) ([place]) 12/10/2015  . Diastolic dysfunction 27/04/5007  . Edema of lower extremity 08/18/2014  . HTN (hypertension) 10/07/2011  . Dyslipidemia, goal LDL below 160 10/07/2011  . Scleroderma ([place]) 10/07/2011  . Varicose veins 10/07/2011    Past Medical History:  Diagnosis Date  . Dyspnea    MYOVIEW, 12/29/2006 - post-stress EF 81%,no ECG changes, EKG negative for ischemia  . Hyperlipidemia   . Hypertension    2D ECHO, 12/24/2006 - EF 45-55%, normal  . Swelling of extremity    LEA VENOUS DUPLEX, 08/19/2011 -  deep valvular insufficiency noted with right femoral and popliteal veins and left common femoral vein  . TIA (transient ischemic attack)    CAROTID DOPPLER, 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      Family History  Problem Relation Age of Onset  . Hyperlipidemia Father   . Cirrhosis Father 73  . Kidney failure Father 52  . Stroke Maternal Grandmother 85  . Hypertension Maternal Grandmother 40  . Angina Maternal Grandfather 42  . Heart attack Maternal Grandfather 88  . Lung disease Paternal Grandmother   . Atrial fibrillation Mother 17  . Hypertension Mother    History reviewed. No pertinent surgical history. Social History   Social History Narrative  . Not on file    Objective: Vital Signs: BP (!) 116/52 (BP Location: Left Arm, Patient Position: Sitting, Cuff Size: Small)   Pulse 84   Resp 14   Ht 5\' 7"  (1.702 m)   Wt 181 lb 3.2 oz (82.2 kg)   BMI 28.38 kg/m    Physical Exam  Constitutional: She is oriented to person, place, and time. She appears well-developed and well-nourished.  HENT:  Head: Normocephalic and atraumatic.  Eyes: Conjunctivae and EOM are normal.  Neck: Normal range of motion.  Cardiovascular: Normal rate, regular rhythm, normal heart sounds and intact distal pulses.  Pulmonary/Chest: Effort normal and breath sounds normal.  Abdominal: Soft. Bowel sounds are normal.  Lymphadenopathy:    She has no cervical adenopathy.  Neurological: She is alert and oriented to person, place, and time.  Skin: Skin is warm and dry. Capillary refill takes 2 to 3 seconds.  Sclerodactyly, telangiectasias  Psychiatric: She has a normal mood and affect. Her behavior is normal.  Nursing note and vitals reviewed.    Musculoskeletal Exam: C-spine thoracic lumbar spine good range of motion.  Shoulder joints elbow joints wrist joints with good range of motion.  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  She also has PIP and DIP thickening of her hands.  Hip joints were in good range of motion.  She has severe osteoarthritis in bilateral knee joints with incomplete extension of her right knee joint.  She has osteoarthritic changes in her feet with DIP and PIP  thickening.  No active synovitis was noted.  CDAI Exam: CDAI Score: Not documented Patient Global Assessment: Not documented; Provider Global Assessment: Not documented Swollen: Not documented; Tender: Not documented Joint Exam   Not documented   There is currently no information documented on the homunculus. Go to the Rheumatology activity and complete the homunculus joint exam.  Investigation: No additional findings.  Imaging: No results found.  Recent Labs: Lab Results  Component Value Date   WBC 6.9 01/20/2017   HGB 14.7 01/20/2017   PLT 297 01/20/2017   NA 139 02/06/2017   K 4.2 02/06/2017   CL 103 02/06/2017   CO2 29 02/06/2017   GLUCOSE 108 (H) 02/06/2017   BUN 23 02/06/2017   CREATININE 0.94 02/06/2017   BILITOT 1.0 02/06/2017   ALKPHOS 64 02/06/2017   AST 18 02/06/2017   ALT 24 02/06/2017   PROT 6.8 02/06/2017   ALBUMIN 4.2 02/06/2017   CALCIUM 9.6 02/06/2017   GFRAA 74 01/20/2017    Speciality Comments: No specialty comments available.  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  Her disease appears to be stable.  I do not see any progression of his sclerodactyly.- Plan: CBC with Differential/Platelet, COMPLETE METABOLIC PANEL WITH GFR, Urinalysis, Routine w reflex microscopic  ILD (interstitial lung disease) ([place]) -she has appointment with Dr. Chase Caller this month.  She denies any increased shortness of breath.  Raynaud's syndrome without gangrene-currently not very active.  Primary osteoarthritis of both hands-she has severe osteoarthritis with incomplete fist formation.  Joint protection muscle strengthening discussed.  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  She had incomplete extension of  her knee joints.  She had good response to Visco supplement injections in December.  She would like to have repeat set of injections in coming December.  We will apply for Visco supplement injections.  Primary osteoarthritis of both feet - She has calluses on the plantar aspect of her bilateral feet.  She has chronic discomfort in her feet.  Bilateral carpal tunnel syndrome-her clinical findings are consistent with bilateral carpal tunnel syndrome.  I have advised her to use carpal tunnel syndrome braces.  I offered nerve conduction velocities which she declined.  Osteopenia of multiple sites-she takes calcium and vitamin D.  History of vitamin D deficiency  History of hypertension-her blood pressure is controlled.  History of diverticulosis  History of hyperlipidemia  Physical deconditioning -she is exercising on a regular basis now.  Orders: Orders Placed This Encounter  Procedures  . CBC with Differential/Platelet  . COMPLETE METABOLIC PANEL WITH GFR  . Urinalysis, Routine w reflex microscopic   No orders of the defined types were placed in this encounter.   Face-to-face time spent with patient was 30 minutes. Greater than 50% of time was spent in counseling and coordination of care.  Follow-Up Instructions: Return in about 6 months (around 05/26/2018) for Scleroderma, Osteoarthritis.   Bo Merino, MD  Note - This record has been created using Editor, commissioning.  Chart creation errors have been sought, but may not always  have been located. Such creation errors do not reflect on  the standard of medical care.

## 2017-11-24 NOTE — Telephone Encounter (Signed)
Please apply for Bil Visco ASAP for Dr. Estanislado Pandy patient. Patient leaving for [facility] December 1st. Thank you so much!

## 2017-11-25 ENCOUNTER — Telehealth (INDEPENDENT_AMBULATORY_CARE_PROVIDER_SITE_OTHER): Payer: Self-pay

## 2017-11-25 LAB — COMPLETE METABOLIC PANEL WITH GFR
AG Ratio: 2 (calc) (ref 1.0–2.5)
ALBUMIN MSPROF: 4.5 g/dL (ref 3.6–5.1)
ALT: 20 U/L (ref 6–29)
AST: 17 U/L (ref 10–35)
Alkaline phosphatase (APISO): 65 U/L (ref 33–130)
BUN: 19 mg/dL (ref 7–25)
CHLORIDE: 106 mmol/L (ref 98–110)
CO2: 26 mmol/L (ref 20–32)
CREATININE: 0.92 mg/dL (ref 0.60–0.93)
Calcium: 10.2 mg/dL (ref 8.6–10.4)
GFR, EST AFRICAN AMERICAN: 72 mL/min/{1.73_m2} (ref >=60)
GFR, Est Non African American: 62 mL/min/{1.73_m2} (ref >=60)
GLUCOSE: 88 mg/dL (ref 65–99)
Globulin: 2.3 g/dL (calc) (ref 1.9–3.7)
Potassium: 4.3 mmol/L (ref 3.5–5.3)
Sodium: 140 mmol/L (ref 135–146)
TOTAL PROTEIN: 6.8 g/dL (ref 6.1–8.1)
Total Bilirubin: 1.1 mg/dL (ref 0.2–1.2)

## 2017-11-25 LAB — CBC WITH DIFFERENTIAL/PLATELET
Basophils Absolute: 58 cells/uL (ref 0–200)
Basophils Relative: 0.9 %
EOS ABS: 154 {cells}/uL (ref 15–500)
Eosinophils Relative: 2.4 %
HCT: 42.7 % (ref 35.0–45.0)
HEMOGLOBIN: 14.8 g/dL (ref 11.7–15.5)
Lymphs Abs: 1824 cells/uL (ref 850–3900)
MCH: 30.8 pg (ref 27.0–33.0)
MCHC: 34.7 g/dL (ref 32.0–36.0)
MCV: 88.8 fL (ref 80.0–100.0)
MONOS PCT: 7.8 %
MPV: 11.3 fL (ref 7.5–12.5)
NEUTROS ABS: 3866 {cells}/uL (ref 1500–7800)
Neutrophils Relative %: 60.4 %
PLATELETS: 307 10*3/uL (ref 140–400)
RBC: 4.81 10*6/uL (ref 3.80–5.10)
RDW: 12.4 % (ref 11.0–15.0)
TOTAL LYMPHOCYTE: 28.5 %
WBC mixed population: 499 cells/uL (ref 200–950)
WBC: 6.4 10*3/uL (ref 3.8–10.8)

## 2017-11-25 LAB — URINALYSIS, ROUTINE W REFLEX MICROSCOPIC
Bilirubin Urine: NEGATIVE
Glucose, UA: NEGATIVE
HGB URINE DIPSTICK: NEGATIVE
KETONES UR: NEGATIVE
Leukocytes, UA: NEGATIVE
NITRITE: NEGATIVE
Protein, ur: NEGATIVE
Specific Gravity, Urine: 1.021 (ref 1.001–1.03)
pH: <=5 (ref 5.0–8.0)

## 2017-11-25 NOTE — Telephone Encounter (Signed)
Please schedule patient and appointment with Dr. Estanislado Pandy for gel injection.  Thank You  Patient is approved for Euflexxa injection series, bilateral knee. Buy & Bill Covered at 100% through her insurance. No Co-pay No PA required

## 2017-12-02 DIAGNOSIS — D2271 Melanocytic nevi of right lower limb, including hip: Secondary | ICD-10-CM | POA: Diagnosis not present

## 2017-12-02 DIAGNOSIS — L918 Other hypertrophic disorders of the skin: Secondary | ICD-10-CM | POA: Diagnosis not present

## 2017-12-02 DIAGNOSIS — D2261 Melanocytic nevi of right upper limb, including shoulder: Secondary | ICD-10-CM | POA: Diagnosis not present

## 2017-12-02 DIAGNOSIS — D225 Melanocytic nevi of trunk: Secondary | ICD-10-CM | POA: Diagnosis not present

## 2017-12-02 DIAGNOSIS — D2272 Melanocytic nevi of left lower limb, including hip: Secondary | ICD-10-CM | POA: Diagnosis not present

## 2017-12-02 DIAGNOSIS — L821 Other seborrheic keratosis: Secondary | ICD-10-CM | POA: Diagnosis not present

## 2017-12-02 DIAGNOSIS — L570 Actinic keratosis: Secondary | ICD-10-CM | POA: Diagnosis not present

## 2017-12-02 DIAGNOSIS — B360 Pityriasis versicolor: Secondary | ICD-10-CM | POA: Diagnosis not present

## 2017-12-02 DIAGNOSIS — D1801 Hemangioma of skin and subcutaneous tissue: Secondary | ICD-10-CM | POA: Diagnosis not present

## 2017-12-02 DIAGNOSIS — D2262 Melanocytic nevi of left upper limb, including shoulder: Secondary | ICD-10-CM | POA: Diagnosis not present

## 2017-12-07 ENCOUNTER — Ambulatory Visit (INDEPENDENT_AMBULATORY_CARE_PROVIDER_SITE_OTHER): Payer: Medicare Other | Admitting: Physician Assistant

## 2017-12-07 DIAGNOSIS — M170 Bilateral primary osteoarthritis of knee: Secondary | ICD-10-CM

## 2017-12-07 MED ORDER — LIDOCAINE HCL 1 % IJ SOLN
1.5000 mL | INTRAMUSCULAR | Status: AC | PRN
  Administered 2017-12-07: 1.5 mL

## 2017-12-07 MED ORDER — SODIUM HYALURONATE (VISCOSUP) 20 MG/2ML IX SOSY
20.0000 mg | PREFILLED_SYRINGE | INTRA_ARTICULAR | Status: AC | PRN
  Administered 2017-12-07: 20 mg via INTRA_ARTICULAR

## 2017-12-07 NOTE — Progress Notes (Signed)
   Procedure Note  Patient: Savannah [name]             Date of Birth: 1945/02/29           MRN: [id number]             Visit Date: 12/07/2017  Procedures: Visit Diagnoses: Primary osteoarthritis of both knees - Plan: Large Joint Inj: bilateral knee Euflexxa #1 bilateral B/B Large Joint Inj: bilateral knee on 12/07/2017 1:42 PM Indications: pain Details: 27 G 1.5 in needle, medial approach  Arthrogram: No  Medications (Right): 20 mg Sodium Hyaluronate 20 MG/2ML; 1.5 mL lidocaine 1 % Aspirate (Right): 0 mL Medications (Left): 20 mg Sodium Hyaluronate 20 MG/2ML; 1.5 mL lidocaine 1 % Aspirate (Left): 0 mL Outcome: tolerated well, no immediate complications Procedure, treatment alternatives, risks and benefits explained, specific risks discussed. Consent was given by the patient. Immediately prior to procedure a time out was called to verify the correct patient, procedure, equipment, support staff and site/side marked as required. Patient was prepped and draped in the usual sterile fashion.      Patient tolerated the procedure well.  Hazel Sams, PA-C

## 2017-12-14 ENCOUNTER — Ambulatory Visit (INDEPENDENT_AMBULATORY_CARE_PROVIDER_SITE_OTHER): Payer: Medicare Other | Admitting: Physician Assistant

## 2017-12-14 DIAGNOSIS — M170 Bilateral primary osteoarthritis of knee: Secondary | ICD-10-CM

## 2017-12-14 MED ORDER — LIDOCAINE HCL 1 % IJ SOLN
1.5000 mL | INTRAMUSCULAR | Status: AC | PRN
  Administered 2017-12-14: 1.5 mL

## 2017-12-14 MED ORDER — SODIUM HYALURONATE (VISCOSUP) 20 MG/2ML IX SOSY
20.0000 mg | PREFILLED_SYRINGE | INTRA_ARTICULAR | Status: AC | PRN
  Administered 2017-12-14: 20 mg via INTRA_ARTICULAR

## 2017-12-14 NOTE — Progress Notes (Signed)
   Procedure Note  Patient: Savannah [name]             Date of Birth: [birth year]           MRN: [id number]             Visit Date: 12/14/2017  Procedures: Visit Diagnoses: Primary osteoarthritis of both knees - Plan: Large Joint Inj: bilateral knee Euflexxa #2 bilateral B/B Large Joint Inj: bilateral knee on 12/14/2017 9:15 AM Indications: pain Details: 27 G 1.5 in needle, medial approach  Arthrogram: No  Medications (Right): 20 mg Sodium Hyaluronate 20 MG/2ML; 1.5 mL lidocaine 1 % Aspirate (Right): 0 mL Medications (Left): 20 mg Sodium Hyaluronate 20 MG/2ML; 1.5 mL lidocaine 1 % Aspirate (Left): 0 mL Outcome: tolerated well, no immediate complications Procedure, treatment alternatives, risks and benefits explained, specific risks discussed. Consent was given by the patient. Immediately prior to procedure a time out was called to verify the correct patient, procedure, equipment, support staff and site/side marked as required. Patient was prepped and draped in the usual sterile fashion.     Patient tolerated the procedure well.  Hazel Sams, PA-C

## 2017-12-16 ENCOUNTER — Ambulatory Visit (INDEPENDENT_AMBULATORY_CARE_PROVIDER_SITE_OTHER): Payer: Medicare Other | Admitting: Internal Medicine

## 2017-12-16 ENCOUNTER — Encounter: Payer: Self-pay | Admitting: Internal Medicine

## 2017-12-16 VITALS — BP 120/68 | HR 80 | Ht 67.0 in | Wt 181.6 lb

## 2017-12-16 DIAGNOSIS — R0600 Dyspnea, unspecified: Secondary | ICD-10-CM

## 2017-12-16 DIAGNOSIS — M349 Systemic sclerosis, unspecified: Secondary | ICD-10-CM | POA: Diagnosis not present

## 2017-12-16 DIAGNOSIS — J849 Interstitial pulmonary disease, unspecified: Secondary | ICD-10-CM

## 2017-12-16 DIAGNOSIS — J8489 Other specified interstitial pulmonary diseases: Secondary | ICD-10-CM | POA: Diagnosis not present

## 2017-12-16 DIAGNOSIS — R0689 Other abnormalities of breathing: Secondary | ICD-10-CM | POA: Diagnosis not present

## 2017-12-16 DIAGNOSIS — M358 Other specified systemic involvement of connective tissue: Secondary | ICD-10-CM

## 2017-12-16 DIAGNOSIS — M359 Systemic involvement of connective tissue, unspecified: Secondary | ICD-10-CM

## 2017-12-16 LAB — PULMONARY FUNCTION TEST
DL/VA % PRED: 72 %
DL/VA: 3.71 ml/min/mmHg/L
DLCO UNC % PRED: 63 %
DLCO unc: 18.01 ml/min/mmHg
FEF 25-75 Pre: 2.06 L/sec
FEF2575-%Pred-Pre: 106 %
FEV1-%Pred-Pre: 100 %
FEV1-PRE: 2.48 L
FEV1FVC-%Pred-Pre: 102 %
FEV6-%Pred-Pre: 102 %
FEV6-Pre: 3.21 L
FEV6FVC-%Pred-Pre: 104 %
FVC-%Pred-Pre: 98 %
FVC-Pre: 3.21 L
PRE FEV1/FVC RATIO: 77 %
Pre FEV6/FVC Ratio: 100 %

## 2017-12-16 NOTE — Progress Notes (Signed)
IOV 12/10/2015 - history some the patient and review of the outside records from Dr. Bronson [name] in cardiology office notes 11/19/2015 Dr. Haroldine [name]  Chief Complaint  Patient presents with  . Pulmonary Consult    Referred by Dr Savannah [name]- Scleroderma - Had HRCT 11/22/15- SOB with walking up driveway or up steps - Denies cough or wheezing    72 year old female was diagnosed with scleroderma in 2008 when she was 72 years old. Notice many problems with the skin in her fingers and the form of a sclerodactyly. I According to her history and referral notes she did have a biopsy with Dr. Wilhemina [name] that confirmed the scleroderma diagnosis     In 2008 she had dyspnea. She had a cardiac stress test at that time she had a Myoview for stress ejection fraction was 81% but resting echo showe d ejection fraction of 45%. She does not remember this but this is per cardiology notes. He tells me now that she's had insidious onset of dyspnea for the last few years. It has been steadily progressive. It is moderate in intensity. She notices this when she climbs stairs or goes to [employer]. It is relieved by rest. It is not associated with any chest pain. This no associated cough or edema or orthopnea paroxysmal nocturnal dyspnea presyncope. According to the cardiology notes she snores a lot.  She had echocardiogram this fall 2017 document below that shows grade 1 diastolic dysfunction with good left ventricular ejection fraction. Right ventricular pump function is normal. No comment on pulmonary systolic pressures.  She underwent pulmonary function test that shows isolated reduction in diffusion capacity documented below. She then underwent high-resolution CT chest that I personally visualized shows some faint groundglass opacities. Therefore she has been referred here.  She tells me that for her scleroderma she's been treated with methotrexate in The past. She Does Have the Details. She Says This Did Not Help  Her. Overall She Is a Nonsmoker. She Drinks Alcohol Socially. She Does Walking for Exercise but Does Have Right Knee Joint Discomfort. Rheumatology Notes Indicate Significant Issues with Osteoarthritis Especially of the Right Knee Is Documented in Office Visits in November 2016 and Subsequently.Also arthritis is also documented in her hand joints   Tests below  Pulmonary function test 11/09/2015 FVC 2.4 L/1%, FEV1 2.6 L/104%. DLCO 5.3/95% and DLCO 17.03/60%. She has isolated reduction in diffusion capacity  IMPRESSION: HRCT 11/22/15 1. Mild patchy subpleural ground-glass attenuation and reticulation in both lungs, most prominent in the lower lobes. No significant traction bronchiectasis. No frank honeycombing. 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 A follow-up high-resolution chest CT study in 12 months would be useful to assess temporal pattern stability. 2. Additional findings include aortic atherosclerosis, three-vessel coronary atherosclerosis and possible 0.9 cm lower left thyroid lobe nodule.   Electronically Signed   By: Savannah [name] M.D.   On: 11/22/2015 13:14  Walking desaturation test 185 feet 3 laps on room air today in the office 12/10/2015: Resting pulse ox was 94% after 3 laps pulse ox was 98%. She had some pain with her right knee where she had no dyspnea. Resting heart rate was 87/m [place] to 93/m  Echocardiogram 11/19/2015 shows grade 1 diastolic dysfunction but otherwise reported as normal. Including right ventricle    OV 10/09/2016  Chief Complaint  Patient presents with    . Follow-up    Pt states she feels she is not able to  breathe in enough air. Pt denies significant cough. Pt states she has chest tightness at random times. Pt denies f/c/s.    Follow-up scleroderma with ILD associated with mild reduction in diffusion capacity - mild severity of disease on observation therapy  Last seen  October 2017. Since then she's been on observation therapy. She had pulmonary function testing in June 2018 that in the last 2 years is showing mild decline in diffusion capacity. She tells me that correlating with this she is feeling more shortness of breath with exertion and even at rest she feels that she has to take a deep breath. There is mild associated cough occasionally but there is no wheeze. There is no fever or edema. Otherwise feeling well. She has some questions about getting pneumonia vaccine. Apparently she's getting an alert about this. Review of the chart shows that she is up-to-date with both Pneumovax and Prevnar within 5 years  Walking desaturation test 185 feet 3 laps on room air: Walking desaturation test on 10/09/2016 185 feet x 3 laps on RA :  did NOT  desaturate. Rest pulse ox was 98%, final pulse ox was 98%. HR response 83/min at rest to 98/min at peak exertion.    OV 12/17/2016  Chief Complaint  Patient presents with  . Follow-up    pft done today and HRCT done 10/23. 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 This continues unchanged. Within the staging workup high-resolution CT scan of the chest shows mild subtle interstitial lung disease that is unchanged compared to one year ago. However on pulmonary function tests this seems to be a trend was decline in both FVC and DLCO. If there is worsening interstitial lung disease this is not reflected on the CT scan [as yet] we also look for pulmonary hypertension with echocardiogram which she had October 2018 shows grade 1 diastolic dysfunctionbut no documentation of pulmonary hypertension. She has never been to pulmonary rehabilitation because of her knee issues she is a bit deconditioned. She is interested in pulmonary rehabilitation but she wants to get this  done in [place] where she lives between January and May 2019    IMPRESSION: compared to oct 2017 1. 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 2. Aortic atherosclerosis, in addition to left main and 3 vessel coronary artery disease. Please note that although the presence of coronary artery calcium documents the presence of coronary artery disease, the severity of this disease and any potential stenosis cannot be assessed on this non-gated CT examination. Assessment for potential risk factor modification, dietary therapy or pharmacologic therapy may be warranted, if clinically indicated.  Aortic Atherosclerosis (ICD10-I70.0).   Electronically Signed   By: Vinnie Langton M.D.   On: 12/09/2016 12:07  OV 12/16/2017  Subjective:  Patient ID: Savannah [name], female , DOB: July 28, 1945 , age 12 y.o. , MRN: [id number] , ADDRESS: [address] [place] [id number]   12/16/2017 -   Chief Complaint  Patient presents with  . Follow-up    shortness of breath     HPI Savannah [name] 72 y.o. 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  Stable over time.  She returns for follow-up.  She spent the time from January through September in [place].  She was supposed to return in July but her return got delayed.  Therefore she is here for this follow-up visit in October 2019.  And once again in January 2020 all the way through July 2020 she will be back in [place] in [place].  At this point in time she is continues to do well without any worsening shortness of breath.  She can do activities of daily living and climb hills and stairs without a problem.  Although she does feel that the edema in her fingers is slowly getting worse particularly in the right hand.  There is no chest pain or any other side effects.  She had pulmonary function test  today that shows continued stability over 3-1/2 years.     Results for DONNI, OGLESBY (MRN [id number]) as of 12/17/2016 11:16  Ref. Range 08/08/2014 09:58 11/09/2015 10:48 08/04/2016 10:57 12/17/2016 09:43 12/16/2017   FVC-Pre Latest Units: L 3.36 3.39 3.24 3.21 3.26  FVC-%Pred-Pre Latest Units: % 99 101 98 97 98   Results for Pat, Caralee A (MRN [id number]) as of 12/17/2016 11:16  Ref. Range 08/08/2014 09:58 11/09/2015 10:48 08/04/2016 10:57 12/17/2016 09:43 12/16/2017   DLCO unc Latest Units: ml/min/mmHg 18.83 17.03 16.89 16.59 18.03  DLCO unc % pred Latest Units: % 66 60 59 58 63     Simple office walk 185 feet x  3 laps goal with forehead probe 12/16/2017   O2 used Room air  Number laps completed 3  Comments about pace normal  Resting Pulse Ox/HR 98% and 70/min  Final Pulse Ox/HR 99% and 90/min  Desaturated </= 88% no  Desaturated <= 3% points no  Got Tachycardic >/= 90/min yes  Symptoms at end of test x  Miscellaneous comments x      ROS - per HPI     has a past medical history of Dyspnea, Hyperlipidemia, Hypertension, Swelling of extremity, and TIA (transient ischemic attack).   reports that she has never smoked. She has never used smokeless tobacco.  No past surgical history on file.  Allergies  Allergen Reactions  . Codeine Nausea Only  . Cymbalta [Duloxetine Hcl] Hives  . Lyrica [Pregabalin] Hives  . Penicillins   . Sulfa Antibiotics Hives  . Zetia [Ezetimibe] Other (See Comments)    Myalgia  . Zocor [Simvastatin] Other (See Comments)    Myalgia    Immunization History  Administered Date(s) Administered  . Pneumococcal Conjugate-13 11/20/2014  . Pneumococcal Polysaccharide-23 11/17/2013  . Tdap 07/29/2016  . Zoster 12/09/2009    Family History  Problem Relation Age of Onset  . Hyperlipidemia Father   . Cirrhosis Father 13  . Kidney failure Father 58  . Stroke Maternal Grandmother 85  . Hypertension Maternal Grandmother 40  . Angina Maternal  Grandfather 40  . Heart attack Maternal Grandfather 88  . Lung disease Paternal Grandmother   . Atrial fibrillation Mother 60  . Hypertension Mother      Current Outpatient Medications:  .  aspirin 81 MG tablet, Take 81 mg by mouth., Disp: , Rfl:  .  Cholecalciferol (VITAMIN D3) 50000 units TABS, Take 50,000 Units by mouth once a week., Disp: , Rfl:  .  Coenzyme Q10 (COQ10) 100 MG CAPS, Take by mouth 2 (two) times a week., Disp: , Rfl:  .  diphenhydrAMINE (BENADRYL) 25 MG tablet, Take 25 mg by mouth every 6 (six) hours as needed., Disp: , Rfl:  .  furosemide (LASIX) 20 MG tablet, Take 1 tablet (20 mg total) by  mouth daily as needed for fluid or edema., Disp: 90 tablet, Rfl: 3 .  Icosapent Ethyl (VASCEPA) 1 g CAPS, Take 2 capsules by mouth 2 (two) times daily., Disp: 360 capsule, Rfl: 1 .  loratadine (CLARITIN) 10 MG tablet, Take 10 mg by mouth daily as needed for allergies., Disp: , Rfl:  .  losartan (COZAAR) 100 MG tablet, TAKE ONE TABLET BY MOUTH AT BEDTIME FOR BLOOD PRESSURE, Disp: 30 tablet, Rfl: 4 .  Menaquinone-7 (VITAMIN K2 PO), Take 50 mcg by mouth once a week., Disp: , Rfl:  .  naproxen sodium (ALEVE) 220 MG tablet, Take 220 mg by mouth., Disp: , Rfl:  .  Pitavastatin Calcium (LIVALO) 2 MG TABS, Take 1 tablet (2 mg total) by mouth daily., Disp: 90 tablet, Rfl: 1 .  potassium chloride SA (KLOR-CON M20) 20 MEQ tablet, Take 1 tablet (20 mEq total) by mouth daily as needed. When you take Furosemide (Lasix)., Disp: 90 tablet, Rfl: 3      Objective:   Vitals:   12/16/17 1020  BP: 120/68  Pulse: 80  SpO2: 99%  Weight: 181 lb 9.6 oz (82.4 kg)  Height: 5\' 7"  (1.702 m)    Estimated body mass index is 28.44 kg/m as calculated from the following:   Height as of this encounter: 5\' 7"  (1.702 m).   Weight as of this encounter: 181 lb 9.6 oz (82.4 kg).  @WEIGHTCHANGE @  [employer]   12/16/17 1020  Weight: 181 lb 9.6 oz (82.4 kg)     Physical Exam  General Appearance:     Alert, cooperative, no distress, appears stated age - bi , Deconditioned looking - bi , OBESE  - bi, Sitting on Wheelchair -  bi  Head:    Normocephalic, without obvious abnormality, atraumatic  Eyes:    PERRL, conjunctiva/corneas clear,  Ears:    Normal TM's and external ear canals, both ears  Nose:   Nares normal, septum midline, mucosa normal, no drainage    or sinus tenderness. OXYGEN ON  - b . Patient is @ ra   Throat:   Lips, mucosa, and tongue normal; teeth and gums normal. Cyanosis on lips - bi  Neck:   Supple, symmetrical, trachea midline, no adenopathy;    thyroid:  no enlargement/tenderness/nodules; no carotid   bruit or JVD  Back:     Symmetric, no curvature, ROM normal, no CVA tenderness  Lungs:     Distress - bi , Wheeze bi, Barrell Chest - bi, Purse lip breathing - bi, Crackles - COULD NOT HEAR CRACKLES   Chest Wall:    No tenderness or deformity.    Heart:    Regular rate and rhythm, S1 and S2 normal, no rub   or gallop, Murmur - no  Breast Exam:    NOT DONE  Abdomen:     Soft, non-tender, bowel sounds active all four quadrants,    no masses, no organomegaly. Visceral obesity - no  Genitalia:   NOT DONE  Rectal:   NOT DONE  Extremities:   Extremities - normal, Has Cane - no, Clubbing - no, Edema - no  Pulses:   2+ and symmetric all extremities  Skin:   Stigmata of Connective Tissue Disease - YES, EDEMA FINGERS R > L. ? Raynaud  Lymph nodes:   Cervical, supraclavicular, and axillary nodes normal  Psychiatric:  Neurologic:   Pleasant - yes, Anxious - no, Flat affect - no  CAm-ICU - neg, Alert and Oriented x 3 -  yes, Moves all 4s - yes, Speech - normal, Cognition - intact           Assessment:       ICD-10-CM   1. 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 Scleroderma ([place]) M34.9   3. Interstitial pulmonary disease ([place]) J84.9 CT CHEST HIGH RESOLUTION       Plan:     Patient Instructions      ICD-10-CM   1. 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 Scleroderma ([place]) M34.9    As you know you have interstitial lung disease secondary to scleroderma  Based on his symptoms and breathing test this is stable between 2016 and October 2019  Deficit is mild  As discussed most likely will continue to remain stable over the next year but there is always an element of unpredictability and that the disease can decide to get worse  One way  to reduce the risk of getting worse is to take a drug called CellCept [more immune suppression side effects] order recently approved drug called nintedanib (ofev) which is less toxic but does have diarrhea side effects.  We made a shared decision that we will continue to observe for the moment  Plan   -Do high-resolution CT chest in July 2020 -Do  spirometry and DLCO July 2020  Followup -Any worsening shortness of breath please call as soon report to her local pulmonologist in [place]  -[facility] in July 2020     > 50% of this > 25 min visit spent in face to face counseling or coordination of care - by this undersigned MD - Dr Brand Males. This includes one or more of the following documented above: discussion of test results, diagnostic or treatment recommendations, prognosis, risks and benefits of management options, instructions, education, compliance or risk-factor reduction   SIGNATURE    Dr. Brand Males, M.D., F.C.C.P,  Pulmonary and Critical Care Medicine Staff Physician, [place] Director - Interstitial Lung Disease  Program  Pulmonary [place] [facility] [place], [place], [address]  Pager: [phone], If no answer or between  15:00h - 7:00h: call [phone] Telephone: [phone]  10:49 AM 12/16/2017

## 2017-12-16 NOTE — Progress Notes (Signed)
Patient completed pre spiro and DLCO today. 

## 2017-12-16 NOTE — Patient Instructions (Addendum)
ICD-10-CM   1. 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  We made a shared decision that we will continue to observe for the moment  Plan   -Do high-resolution CT chest in July 2020 -Do  spirometry and DLCO July 2020  Followup -Any worsening shortness of breath please call as soon report to her local pulmonologist in [place]  -[facility] in July 2020

## 2017-12-21 ENCOUNTER — Ambulatory Visit (INDEPENDENT_AMBULATORY_CARE_PROVIDER_SITE_OTHER): Payer: Medicare Other | Admitting: Physician Assistant

## 2017-12-21 DIAGNOSIS — M170 Bilateral primary osteoarthritis of knee: Secondary | ICD-10-CM

## 2017-12-21 MED ORDER — SODIUM HYALURONATE (VISCOSUP) 20 MG/2ML IX SOSY
20.0000 mg | PREFILLED_SYRINGE | INTRA_ARTICULAR | Status: AC | PRN
  Administered 2017-12-21: 20 mg via INTRA_ARTICULAR

## 2017-12-21 MED ORDER — LIDOCAINE HCL 1 % IJ SOLN
1.5000 mL | INTRAMUSCULAR | Status: AC | PRN
  Administered 2017-12-21: 1.5 mL

## 2017-12-21 NOTE — Progress Notes (Signed)
   Procedure Note  Patient: ELLISSA AYO             Date of Birth: 17-Nov-1945           MRN: [id number]             Visit Date: 12/21/2017  Procedures: Visit Diagnoses: Primary osteoarthritis of both knees - Plan: Large Joint Inj: bilateral knee  Large Joint Inj: bilateral knee on 12/21/2017 1:37 PM Indications: pain Details: 27 G 1.5 in needle, medial approach  Arthrogram: No  Medications (Right): 20 mg Sodium Hyaluronate 20 MG/2ML; 1.5 mL lidocaine 1 % Aspirate (Right): 0 mL Medications (Left): 20 mg Sodium Hyaluronate 20 MG/2ML; 1.5 mL lidocaine 1 % Aspirate (Left): 0 mL Outcome: tolerated well, no immediate complications Procedure, treatment alternatives, risks and benefits explained, specific risks discussed. Consent was given by the patient. Immediately prior to procedure a time out was called to verify the correct patient, procedure, equipment, support staff and site/side marked as required. Patient was prepped and draped in the usual sterile fashion.     Patient tolerated the procedure well.  Hazel Sams, PA-C

## 2017-12-23 ENCOUNTER — Ambulatory Visit (HOSPITAL_COMMUNITY)
Admission: RE | Admit: 2017-12-23 | Discharge: 2017-12-23 | Disposition: A | Discharge status: Home / Self Care | Payer: Medicare Other | Source: Ambulatory Visit | Attending: Internal Medicine | Admitting: Internal Medicine

## 2017-12-23 DIAGNOSIS — I38 Endocarditis, valve unspecified: Secondary | ICD-10-CM | POA: Diagnosis not present

## 2017-12-23 DIAGNOSIS — I110 Hypertensive heart disease with heart failure: Secondary | ICD-10-CM | POA: Diagnosis not present

## 2017-12-23 DIAGNOSIS — I5032 Chronic diastolic (congestive) heart failure: Secondary | ICD-10-CM

## 2017-12-23 DIAGNOSIS — E785 Hyperlipidemia, unspecified: Secondary | ICD-10-CM | POA: Diagnosis not present

## 2017-12-23 NOTE — Progress Notes (Signed)
  Echocardiogram 2D Echocardiogram has been performed.  Savannah [name] 12/23/2017, 1:46 PM

## 2017-12-24 ENCOUNTER — Ambulatory Visit (INDEPENDENT_AMBULATORY_CARE_PROVIDER_SITE_OTHER): Payer: Medicare Other | Admitting: Internal Medicine

## 2017-12-24 ENCOUNTER — Encounter: Payer: Self-pay | Admitting: Internal Medicine

## 2017-12-24 ENCOUNTER — Other Ambulatory Visit (INDEPENDENT_AMBULATORY_CARE_PROVIDER_SITE_OTHER): Payer: Medicare Other

## 2017-12-24 VITALS — BP 144/80 | HR 68 | Temp 98.1°F | Resp 16 | Ht 67.0 in | Wt 184.2 lb

## 2017-12-24 DIAGNOSIS — I10 Essential (primary) hypertension: Secondary | ICD-10-CM

## 2017-12-24 DIAGNOSIS — E785 Hyperlipidemia, unspecified: Secondary | ICD-10-CM

## 2017-12-24 DIAGNOSIS — E781 Pure hyperglyceridemia: Secondary | ICD-10-CM | POA: Diagnosis not present

## 2017-12-24 DIAGNOSIS — R7989 Other specified abnormal findings of blood chemistry: Secondary | ICD-10-CM

## 2017-12-24 DIAGNOSIS — H6122 Impacted cerumen, left ear: Secondary | ICD-10-CM

## 2017-12-24 LAB — LIPID PANEL
Cholesterol: 158 mg/dL (ref 0–200)
HDL: 44.8 mg/dL (ref >39.00)
NONHDL: 113.16
Total CHOL/HDL Ratio: 4
Triglycerides: 211 mg/dL — ABNORMAL HIGH (ref 0.0–149.0)
VLDL: 42.2 mg/dL — ABNORMAL HIGH (ref 0.0–40.0)

## 2017-12-24 LAB — LDL CHOLESTEROL, DIRECT: LDL DIRECT: 94 mg/dL

## 2017-12-24 NOTE — Patient Instructions (Signed)

## 2017-12-24 NOTE — Progress Notes (Signed)
Subjective:  Patient ID: Savannah [name], female    DOB: [birth year]  Age: 72 y.o. MRN: [id number]  CC: Hyperlipidemia and Hypertension   HPI Savannah [name] presents for f/up - She complains of a several week history of decreased hearing in her left ear.  She otherwise feels well and offers no complaints.  He tells me her blood pressure has been well controlled.  She has been working on her lifestyle modifications to lower her triglycerides.  Outpatient Medications Prior to Visit  Medication Sig Dispense Refill  . aspirin 81 MG tablet Take 81 mg by mouth.    . Coenzyme Q10 (COQ10) 100 MG CAPS Take by mouth 2 (two) times a week.    . diphenhydrAMINE (BENADRYL) 25 MG tablet Take 25 mg by mouth every 6 (six) hours as needed.    . furosemide (LASIX) 20 MG tablet Take 1 tablet (20 mg total) by mouth daily as needed for fluid or edema. 90 tablet 3  . Icosapent Ethyl (VASCEPA) 1 g CAPS Take 2 capsules by mouth 2 (two) times daily. 360 capsule 1  . loratadine (CLARITIN) 10 MG tablet Take 10 mg by mouth daily as needed for allergies.    Marland Kitchen losartan (COZAAR) 100 MG tablet TAKE ONE TABLET BY MOUTH AT BEDTIME FOR BLOOD PRESSURE 30 tablet 4  . Menaquinone-7 (VITAMIN K2 PO) Take 50 mcg by mouth once a week.    . Multiple Vitamins-Minerals (VITAMIN D3 COMPLETE PO) Take 5,000 Units by mouth daily.    . naproxen sodium (ALEVE) 220 MG tablet Take 220 mg by mouth.    . Pitavastatin Calcium (LIVALO) 2 MG TABS Take 1 tablet (2 mg total) by mouth daily. 90 tablet 1  . potassium chloride SA (KLOR-CON M20) 20 MEQ tablet Take 1 tablet (20 mEq total) by mouth daily as needed. When you take Furosemide (Lasix). 90 tablet 3  . Cholecalciferol (VITAMIN D3) 50000 units TABS Take 50,000 Units by mouth once a week.     No facility-administered medications prior to visit.     ROS Review of Systems  Constitutional: Negative.  Negative for diaphoresis, fatigue and unexpected weight change.  HENT: Positive for hearing loss.  Negative for ear discharge, ear pain and sore throat.   Eyes: Negative.   Respiratory: Negative.  Negative for cough, chest tightness, shortness of breath and wheezing.   Cardiovascular: Negative for chest pain, palpitations and leg swelling.  Gastrointestinal: Negative for abdominal pain, constipation, diarrhea, nausea and vomiting.  Endocrine: Negative.   Genitourinary: Negative.  Negative for difficulty urinating and dysuria.  Musculoskeletal: Negative.  Negative for back pain, myalgias and neck pain.  Skin: Negative.  Negative for color change.  Neurological: Negative.  Negative for dizziness, weakness, light-headedness and numbness.  Hematological: Negative for adenopathy. Does not bruise/bleed easily.  Psychiatric/Behavioral: Negative.     Objective:  BP (!) 144/80 (BP Location: Left Arm, Patient Position: Sitting, Cuff Size: Large)   Pulse 68   Temp 98.1 F (36.7 C) (Oral)   Resp 16   Ht 5\' 7"  (1.702 m)   Wt 184 lb 4 oz (83.6 kg)   SpO2 97%   BMI 28.86 kg/m   BP Readings from Last 3 Encounters:  12/24/17 (!) 144/80  12/16/17 120/68  11/24/17 (!) 116/52    Wt Readings from Last 3 Encounters:  12/24/17 184 lb 4 oz (83.6 kg)  12/16/17 181 lb 9.6 oz (82.4 kg)  11/24/17 181 lb 3.2 oz (82.2 kg)    Physical  Exam  Constitutional: She is oriented to person, place, and time. No distress.  HENT:  Right Ear: Hearing, tympanic membrane, external ear and ear canal normal.  Left Ear: Hearing, tympanic membrane and external ear normal. A foreign body (cerumen impaction) is present.  Mouth/Throat: Oropharynx is clear and moist. No oropharyngeal exudate.  I put Colace in her left ear and then I irrigated it with water and used an ear pick to the remove the cerumen.  She tolerated this well and her hearing has now returned to normal.  The examination after this is within normal limits.  Eyes: Conjunctivae are normal. No scleral icterus.  Neck: Normal range of motion. Neck supple. No  JVD present. No thyromegaly present.  Cardiovascular: Normal rate, regular rhythm and normal heart sounds. Exam reveals no gallop.  No murmur heard. Pulmonary/Chest: Effort normal and breath sounds normal. No respiratory distress. She has no wheezes. She has no rales.  Abdominal: Soft. Bowel sounds are normal. She exhibits no mass. There is no hepatosplenomegaly. There is no tenderness.  Musculoskeletal: Normal range of motion. She exhibits no edema, tenderness or deformity.  Lymphadenopathy:    She has no cervical adenopathy.  Neurological: She is alert and oriented to person, place, and time.  Skin: Skin is warm and dry. She is not diaphoretic. No pallor.  Vitals reviewed.   Lab Results  Component Value Date   WBC 6.4 11/24/2017   HGB 14.8 11/24/2017   HCT 42.7 11/24/2017   PLT 307 11/24/2017   GLUCOSE 88 11/24/2017   CHOL 158 12/24/2017   TRIG 211.0 (H) 12/24/2017   HDL 44.80 12/24/2017   LDLDIRECT 94.0 12/24/2017   LDLCALC 85 11/16/2015   ALT 20 11/24/2017   AST 17 11/24/2017   NA 140 11/24/2017   K 4.3 11/24/2017   CL 106 11/24/2017   CREATININE 0.92 11/24/2017   BUN 19 11/24/2017   CO2 26 11/24/2017   TSH 3.99 12/24/2017    No results found.  Assessment & Plan:   Savannah [name] was seen today for hyperlipidemia and hypertension.  Diagnoses and all orders for this visit:  Essential hypertension- Her blood pressure is well controlled. -     Thyroid Panel With TSH; Future  Dyslipidemia, goal LDL below 160- She has achieved her LDL goal is doing well on the statin. -     Lipid panel; Future -     Thyroid Panel With TSH; Future  TSH elevation- Her TFTs are normal now and she appears euthyroid. -     Thyroid Panel With TSH; Future  Pure hyperglyceridemia- Her triglycerides have improved quite a bit.  I have asked her to continue taking vascepa and she was praised for the improvements in her lifestyle modifications. -     Lipid panel; Future   I have discontinued Savannah  A. [name]'s Vitamin D3. I am also having her maintain her aspirin, Menaquinone-7 (VITAMIN K2 PO), potassium chloride SA, furosemide, loratadine, naproxen sodium, Pitavastatin Calcium, Icosapent Ethyl, losartan, CoQ10, diphenhydrAMINE, and Multiple Vitamins-Minerals (VITAMIN D3 COMPLETE PO).  No orders of the defined types were placed in this encounter.    Follow-up: Return in about 6 months (around 06/24/2018).  Scarlette Calico, MD

## 2017-12-25 ENCOUNTER — Encounter: Payer: Self-pay | Admitting: Internal Medicine

## 2017-12-25 LAB — THYROID PANEL WITH TSH
FREE THYROXINE INDEX: 2.1 (ref 1.4–3.8)
T3 UPTAKE: 30 % (ref 22–35)
T4 TOTAL: 7.1 ug/dL (ref 5.1–11.9)
TSH: 3.99 mIU/L (ref 0.40–4.50)

## 2017-12-26 DIAGNOSIS — H6122 Impacted cerumen, left ear: Secondary | ICD-10-CM | POA: Insufficient documentation

## 2017-12-30 ENCOUNTER — Ambulatory Visit (HOSPITAL_COMMUNITY)
Admission: RE | Admit: 2017-12-30 | Discharge: 2017-12-30 | Disposition: A | Discharge status: Home / Self Care | Payer: Medicare Other | Source: Ambulatory Visit | Attending: Internal Medicine | Admitting: Internal Medicine

## 2017-12-30 ENCOUNTER — Other Ambulatory Visit: Payer: Self-pay

## 2017-12-30 VITALS — BP 138/66 | HR 76 | Wt 184.4 lb

## 2017-12-30 DIAGNOSIS — Z880 Allergy status to penicillin: Secondary | ICD-10-CM | POA: Insufficient documentation

## 2017-12-30 DIAGNOSIS — E782 Mixed hyperlipidemia: Secondary | ICD-10-CM | POA: Diagnosis not present

## 2017-12-30 DIAGNOSIS — Z8249 Family history of ischemic heart disease and other diseases of the circulatory system: Secondary | ICD-10-CM | POA: Insufficient documentation

## 2017-12-30 DIAGNOSIS — Z79899 Other long term (current) drug therapy: Secondary | ICD-10-CM | POA: Insufficient documentation

## 2017-12-30 DIAGNOSIS — M349 Systemic sclerosis, unspecified: Secondary | ICD-10-CM | POA: Diagnosis not present

## 2017-12-30 DIAGNOSIS — I2510 Atherosclerotic heart disease of native coronary artery without angina pectoris: Secondary | ICD-10-CM | POA: Diagnosis not present

## 2017-12-30 DIAGNOSIS — Z882 Allergy status to sulfonamides status: Secondary | ICD-10-CM | POA: Insufficient documentation

## 2017-12-30 DIAGNOSIS — Z8673 Personal history of transient ischemic attack (TIA), and cerebral infarction without residual deficits: Secondary | ICD-10-CM | POA: Diagnosis not present

## 2017-12-30 DIAGNOSIS — Z8379 Family history of other diseases of the digestive system: Secondary | ICD-10-CM | POA: Insufficient documentation

## 2017-12-30 DIAGNOSIS — E785 Hyperlipidemia, unspecified: Secondary | ICD-10-CM | POA: Diagnosis not present

## 2017-12-30 DIAGNOSIS — R0683 Snoring: Secondary | ICD-10-CM | POA: Diagnosis not present

## 2017-12-30 DIAGNOSIS — Z841 Family history of disorders of kidney and ureter: Secondary | ICD-10-CM | POA: Diagnosis not present

## 2017-12-30 DIAGNOSIS — Z885 Allergy status to narcotic agent status: Secondary | ICD-10-CM | POA: Insufficient documentation

## 2017-12-30 DIAGNOSIS — J849 Interstitial pulmonary disease, unspecified: Secondary | ICD-10-CM | POA: Diagnosis not present

## 2017-12-30 DIAGNOSIS — Z7982 Long term (current) use of aspirin: Secondary | ICD-10-CM | POA: Diagnosis not present

## 2017-12-30 DIAGNOSIS — I10 Essential (primary) hypertension: Secondary | ICD-10-CM | POA: Diagnosis not present

## 2017-12-30 DIAGNOSIS — Z888 Allergy status to other drugs, medicaments and biological substances status: Secondary | ICD-10-CM | POA: Insufficient documentation

## 2017-12-30 NOTE — Progress Notes (Signed)
Patient ID: [name], female   DOB: 1945/12/09, 72 y.o.   MRN: [id number]  Referring Physician: Estanislado Pandy Primary Care: Eilleen Kempf  Primary Cardiologist: Debara Pickett (formerly)  HPI: Savannah [name] is a 72 y.o. female with hx of scleroderma, TIA, HTN, Dyslipidemia with elevated TGs, and LE varicose veins. She was referred by Dr. Estanislado Pandy for screening for [facility] in setting of scleroderma.  Says she was diagnosed with scleroderma around 2005. Noticed skin on her fingers had bx which confirmed scleroderma.   Had dyspnea in 2008. And had a stress test and echo. Her EF was  45-55% by Echo 11/08. Myoview in 11/08 negative for ischemia with post stress test EF of 81%.  Here for routine f/u. At last visit echo normal but found to have mild ILD on CT scan. Referred to Dr. Chase Caller. Has repeat hi res CT in 10/18 with very mild ILD. Incidental note of 3v coronary calcifications. Referred to Pulmonary Rehab and did that in [place]. Now exercising on her own at the [place] club on TM and Nu-Step. No SOB or CP. Struggles with knee pain.   Hi-res CT 10/17: c/w with mild ILD   Echo 12/23/17: Ef 55-60% mild Aov Calcification RV normal No TR.   Echo 08/18/14 EF 60% Grade I DD. Normal RV. No TR.  Echo 10/17  EF 55-60% RV normal  PFTs 10/19 FEV1 2.48L (100%) FVC  3.21L (98%) DLCO 63%   PFTs 6/18 FEV1 2.55 L (101%) FVC   3.24 L (98%) DLCO  59%  PFTs  11/09/15 FEV1 2.65 L (99%) FVC   3.39 L (101%) DLCO  60%  PFTs 08/08/14 FEV1 2.54 L (99%) FVC   3.36 L (99%) DLCO  66%   Past Medical History:  Diagnosis Date  . Dyspnea    MYOVIEW, 12/29/2006 - post-stress EF 81%,no ECG changes, EKG negative for ischemia  . Hyperlipidemia   . Hypertension    2D ECHO, 12/24/2006 - EF 45-55%, normal  . Swelling of extremity    LEA VENOUS DUPLEX, 08/19/2011 - deep valvular insufficiency noted with right femoral and popliteal veins and left common femoral vein  . TIA (transient ischemic attack)    CAROTID DOPPLER, 12/22/2006 -  Right and Left ICAs-no evidence of diameter reduction    Current Outpatient Medications  Medication Sig Dispense Refill  . aspirin 81 MG tablet Take 81 mg by mouth.    . Coenzyme Q10 (COQ10) 100 MG CAPS Take by mouth 2 (two) times a week.    . diphenhydrAMINE (BENADRYL) 25 MG tablet Take 25 mg by mouth every 6 (six) hours as needed.    . furosemide (LASIX) 20 MG tablet Take 1 tablet (20 mg total) by mouth daily as needed for fluid or edema. 90 tablet 3  . Icosapent Ethyl (VASCEPA) 1 g CAPS Take 2 capsules by mouth 2 (two) times daily. 360 capsule 1  . loratadine (CLARITIN) 10 MG tablet Take 10 mg by mouth daily as needed for allergies.    Marland Kitchen losartan (COZAAR) 100 MG tablet TAKE ONE TABLET BY MOUTH AT BEDTIME FOR BLOOD PRESSURE 30 tablet 4  . Menaquinone-7 (VITAMIN K2 PO) Take 50 mcg by mouth once a week.    . Multiple Vitamins-Minerals (VITAMIN D3 COMPLETE PO) Take 5,000 Units by mouth daily.    . naproxen sodium (ALEVE) 220 MG tablet Take 220 mg by mouth.    . Pitavastatin Calcium (LIVALO) 2 MG TABS Take 1 tablet (2 mg total) by mouth daily. [place]  tablet 1  . potassium chloride SA (KLOR-CON M20) 20 MEQ tablet Take 1 tablet (20 mEq total) by mouth daily as needed. When you take Furosemide (Lasix). 90 tablet 3   No current facility-administered medications for this encounter.     Allergies  Allergen Reactions  . Codeine Nausea Only  . Cymbalta [Duloxetine Hcl] Hives  . Lyrica [Pregabalin] Hives  . Penicillins   . Sulfa Antibiotics Hives  . Zetia [Ezetimibe] Other (See Comments)    Myalgia  . Zocor [Simvastatin] Other (See Comments)    Myalgia      Social History   Socioeconomic History  . Marital status: Married    Spouse name: Not on file  . Number of children: Not on file  . Years of education: Not on file  . Highest education level: Not on file  Occupational History  . Not on file  Social Needs  . Financial resource strain: Not on file  . Food insecurity:    Worry: Not  on file    Inability: Not on file  . Transportation needs:    Medical: Not on file    Non-medical: Not on file  Tobacco Use  . Smoking status: Never Smoker  . Smokeless tobacco: Never Used  Substance and Sexual Activity  . Alcohol use: Yes    Alcohol/week: 6.0 standard drinks    Types: 6 drink(s) per week  . Drug use: No  . Sexual activity: Not on file  Lifestyle  . Physical activity:    Days per week: Not on file    Minutes per session: Not on file  . Stress: Not on file  Relationships  . Social connections:    Talks on phone: Not on file    Gets together: Not on file    Attends religious service: Not on file    Active member of club or organization: Not on file    Attends meetings of clubs or organizations: Not on file    Relationship status: Not on file  . Intimate partner violence:    Fear of current or ex partner: Not on file    Emotionally abused: Not on file    Physically abused: Not on file    Forced sexual activity: Not on file  Other Topics Concern  . Not on file  Social History Narrative  . Not on file      Family History  Problem Relation Age of Onset  . Hyperlipidemia Father   . Cirrhosis Father 43  . Kidney failure Father 73  . Stroke Maternal Grandmother 85  . Hypertension Maternal Grandmother 40  . Angina Maternal Grandfather 46  . Heart attack Maternal Grandfather 88  . Lung disease Paternal Grandmother   . Atrial fibrillation Mother 20  . Hypertension Mother     Vitals:   12/30/17 1515  BP: 138/66  Pulse: 76  SpO2: 97%  Weight: 83.6 kg (184 lb 6.4 oz)   BP 128/68  PHYSICAL EXAM: General:  Well appearing. No resp difficulty HEENT: normal Neck: supple. no JVD. Carotids 2+ bilat; no bruits. No lymphadenopathy or thryomegaly appreciated. Cor: PMI nondisplaced. Regular rate & rhythm. No rubs, gallops or murmurs. Lungs: clear Abdomen: soft, nontender, nondistended. No hepatosplenomegaly. No bruits or masses. Good bowel  sounds. Extremities: no cyanosis, clubbing, rash, edema Neuro: alert & orientedx3, cranial nerves grossly intact. moves all 4 extremities w/o difficulty. Affect pleasant   ASSESSMENT & PLAN:  1. Scleroderma with related mild ILD - Overall doing well. PFTs completely  stable. Is following with Dr. Chase Caller and has repeat CT scan pending - Echo reviewed personally and no evidence of [place] - Continue yearly screening. Low threshold for RHC with any evidence of PAH, RV strain or decreasing DLCO.   2. HTN  -Somewhat labile but overall well controlled  3. Dyslipidemia  - Has severe hyperTG now on Vascepa and pitvastatin.  - Her husband sees Dr. Claiborne Billings. Will refer to him for ongoing management at her request 4. TIA - no deficits 5. Snoring - refuses sleep study 6. Coronary calcifications - CT chest reviewed personally. She has 3v coronary calcifications. We discussed what this meant. We also discussed possible stress testing or cath but she is quite active without angina and prefers to avoid further testing currently.  - Will refer to Dr. Lenward Chancellor for further risk management. Continue ASA and lipid management with goal LDL < 70.   Glori Bickers, MD  7:11 PM

## 2017-12-30 NOTE — Patient Instructions (Signed)
You have been referred to Dr. Claiborne Billings.  Follow up in 50months with echo and pfts

## 2018-01-04 ENCOUNTER — Other Ambulatory Visit (HOSPITAL_COMMUNITY): Payer: Self-pay | Admitting: *Deleted

## 2018-01-04 DIAGNOSIS — I10 Essential (primary) hypertension: Secondary | ICD-10-CM

## 2018-01-11 ENCOUNTER — Other Ambulatory Visit: Payer: Self-pay

## 2018-01-11 MED ORDER — LOSARTAN POTASSIUM 100 MG PO TABS: 100.0000 mg | ORAL_TABLET | Freq: Every day | ORAL | 4 refills | Status: DC

## 2018-01-18 ENCOUNTER — Ambulatory Visit (INDEPENDENT_AMBULATORY_CARE_PROVIDER_SITE_OTHER): Payer: Medicare Other | Admitting: Cardiovascular Disease

## 2018-01-18 ENCOUNTER — Encounter: Payer: Self-pay | Admitting: Cardiovascular Disease

## 2018-01-18 VITALS — BP 124/62 | HR 82 | Ht 67.0 in | Wt 183.2 lb

## 2018-01-18 DIAGNOSIS — J849 Interstitial pulmonary disease, unspecified: Secondary | ICD-10-CM | POA: Diagnosis not present

## 2018-01-18 DIAGNOSIS — E782 Mixed hyperlipidemia: Secondary | ICD-10-CM

## 2018-01-18 DIAGNOSIS — M349 Systemic sclerosis, unspecified: Secondary | ICD-10-CM

## 2018-01-18 DIAGNOSIS — I10 Essential (primary) hypertension: Secondary | ICD-10-CM | POA: Diagnosis not present

## 2018-01-18 DIAGNOSIS — I2510 Atherosclerotic heart disease of native coronary artery without angina pectoris: Secondary | ICD-10-CM | POA: Diagnosis not present

## 2018-01-18 MED ORDER — EZETIMIBE 10 MG PO TABS: 10.0000 mg | ORAL_TABLET | Freq: Every day | ORAL | 3 refills | Status: DC

## 2018-01-18 MED ORDER — METOPROLOL TARTRATE 100 MG PO TABS: 100.0000 mg | ORAL_TABLET | Freq: Once | ORAL | 0 refills | Status: DC

## 2018-01-18 NOTE — Progress Notes (Signed)
Cardiology Office Note    Date:  01/20/2018   ID:  [name], DOB 07/26/1945, MRN [id number]  PCP:  Janith Lima, MD  Cardiologist:  Shelva Majestic, MD   No chief complaint on file.  New cardiology evaluation referred through the courtesy of Dr. Haroldine Laws  History of Present Illness:  Savannah [name] is a 72 y.o. female presents to establish cardiology care with me.  Ms. Heine has a history of scleroderma, TIA, hypertension, mixed hyperlipidemia, as well as lower extremity varicose veins.  She has biopsy-proven scleroderma biopsy on her hand around 2005.  She has had issues with shortness of breath in the past.  In 2008, her ejection fraction was 45 to 55% by echocardiography and a Myoview study November 2008 was negative for ischemia with hyperdynamic post stress ejection fraction.  She has been seen by Dr. Haroldine Laws.  On CT imaging she was found to have mild interstitial lung disease for which she subsequently was referred to Dr. Chase Caller.  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  On that CT she was also noted to have aortic atherosclerosis in addition to left main and three-vessel coronary artery disease.  However this was not gated and assessment of obstructive stenoses could not be made.  On December 23, 2017 she underwent a follow-up echo Doppler study which showed an EF of 55 to 60%.  LV strain was normal at -17.4%.  She did not have regional wall motion abnormalities.  She had mild calcification of her aortic leaflets.  There is trivial MR and PR.  PA pressures were not increased.  She last saw Dr. Haroldine Laws in November 2019 and presents now for cardiology care and risk management.  He has a history of significant mixed hyperlipidemia and is now on the Vascepa and Livalo, apparently has only been taking Livalo maybe once  or at most twice per week.  Admits to rare to occasional palpitations.  She denies recent chest tightness.  She denies presyncope or syncope..  I have cared for her husband.  She has a history of snoring but states her sleep is restorative.  She typically goes to [place] from mid January through June.  She presents for evaluation.   Past Medical History:  Diagnosis Date  . Dyspnea    MYOVIEW, 12/29/2006 - post-stress EF 81%,no ECG changes, EKG negative for ischemia  . Hyperlipidemia   . Hypertension    2D ECHO, 12/24/2006 - EF 45-55%, normal  . Swelling of extremity    LEA VENOUS DUPLEX, 08/19/2011 - deep valvular insufficiency noted with right femoral and popliteal veins and left common femoral vein  . TIA (transient ischemic attack)    CAROTID DOPPLER, 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    No past surgical history on file.  Current Medications: Outpatient Medications Prior to Visit  Medication Sig Dispense Refill  . aspirin 81 MG tablet Take 81 mg by mouth.    . Cholecalciferol (D3 MAXIMUM STRENGTH PO) Take 5,000 Units by mouth as directed.    . Coenzyme Q10 (COQ10) 100 MG CAPS Take by mouth 2 (two) times a week.    . diphenhydrAMINE (BENADRYL) 25 MG tablet Take 25 mg by mouth every 6 (six) hours as needed.    . furosemide (LASIX) 20 MG tablet Take 1 tablet (20 mg total) by mouth  daily as needed for fluid or edema. 90 tablet 3  . Icosapent Ethyl (VASCEPA) 1 g CAPS Take 2 capsules by mouth 2 (two) times daily. (Patient taking differently: Take 1 capsule by mouth once a week. ) 360 capsule 1  . loratadine (CLARITIN) 10 MG tablet Take 10 mg by mouth daily as needed for allergies.    Marland Kitchen losartan (COZAAR) 100 MG tablet Take 1 tablet (100 mg total) by mouth daily. 30 tablet 4  . Menaquinone-7 (VITAMIN K2 PO) Take 50 mcg by mouth once a week.    . Multiple Vitamins-Minerals (VITAMIN D3 COMPLETE PO) Take 5,000 Units by mouth daily.    . naproxen sodium (ALEVE) 220  MG tablet Take 220 mg by mouth.    . Pitavastatin Calcium (LIVALO) 2 MG TABS Take 1 tablet (2 mg total) by mouth daily. (Patient taking differently: Take 2 mg by mouth as directed. ) 90 tablet 1  . potassium chloride SA (KLOR-CON M20) 20 MEQ tablet Take 1 tablet (20 mEq total) by mouth daily as needed. When you take Furosemide (Lasix). 90 tablet 3   No facility-administered medications prior to visit.      Allergies:   Codeine; Cymbalta [duloxetine hcl]; Lyrica [pregabalin]; Penicillins; Sulfa antibiotics; Zetia [ezetimibe]; and Zocor [simvastatin]   Social History   Socioeconomic History  . Marital status: Married    Spouse name: Not on file  . Number of children: Not on file  . Years of education: Not on file  . Highest education level: Not on file  Occupational History  . Not on file  Social Needs  . Financial resource strain: Not on file  . Food insecurity:    Worry: Not on file    Inability: Not on file  . Transportation needs:    Medical: Not on file    Non-medical: Not on file  Tobacco Use  . Smoking status: Never Smoker  . Smokeless tobacco: Never Used  Substance and Sexual Activity  . Alcohol use: Yes    Alcohol/week: 6.0 standard drinks    Types: 6 drink(s) per week  . Drug use: No  . Sexual activity: Not on file  Lifestyle  . Physical activity:    Days per week: Not on file    Minutes per session: Not on file  . Stress: Not on file  Relationships  . Social connections:    Talks on phone: Not on file    Gets together: Not on file    Attends religious service: Not on file    Active member of club or organization: Not on file    Attends meetings of clubs or organizations: Not on file    Relationship status: Not on file  Other Topics Concern  . Not on file  Social History Narrative  . Not on file     Family History:  The patient's family history includes Angina (age of onset: 66) in her maternal grandfather; Atrial fibrillation (age of onset: 30) in her  mother; Cirrhosis (age of onset: 107) in her father; Heart attack (age of onset: 15) in her maternal grandfather; Hyperlipidemia in her father; Hypertension in her mother; Hypertension (age of onset: 71) in her maternal grandmother; Kidney failure (age of onset: 78) in her father; Lung disease in her paternal grandmother; Stroke (age of onset: 61) in her maternal grandmother.   ROS General: Negative; No fevers, chills, or night sweats;  HEENT: Negative; No changes in vision or hearing, sinus congestion, difficulty swallowing Pulmonary: Shortness of breath  Cardiovascular: No chest pain, rare palpitations.  Mixed hyperlipidemia GI: Negative; No nausea, vomiting, diarrhea, or abdominal pain GU: Negative; No dysuria, hematuria, or difficulty voiding Musculoskeletal: Negative; no myalgias, joint pain, or weakness Rheumatologic: scleroderma Hematologic/Oncology: Negative; no easy bruising, bleeding Endocrine: Negative; no heat/cold intolerance; no diabetes Neuro: History of TIA Skin: Negative; No rashes or skin lesions Psychiatric: Negative; No behavioral problems, depression Sleep: Positive for snoring, no daytime sleepiness, hypersomnolence, bruxism, restless legs, hypnogognic hallucinations, no cataplexy Other comprehensive 14 point system review is negative.   PHYSICAL EXAM:   VS:  BP 124/62   Pulse 82   Ht '5\' 7"'$  (1.702 m)   Wt 183 lb 3.2 oz (83.1 kg)   BMI 28.69 kg/m     Repeat blood pressure by me 144/64  Wt Readings from Last 3 Encounters:  01/18/18 183 lb 3.2 oz (83.1 kg)  12/30/17 184 lb 6.4 oz (83.6 kg)  12/24/17 184 lb 4 oz (83.6 kg)    General: Alert, oriented, no distress.  Skin: normal turgor, no rashes, warm and dry HEENT: Normocephalic, atraumatic. Pupils equal round and reactive to light; sclera anicteric; extraocular muscles intact; probable early left eye cataract; no hemorrhages or exudates Nose without nasal septal hypertrophy Mouth/Parynx benign; Mallinpatti  scale 3/4 Neck: No JVD, no carotid bruits; normal carotid upstroke Lungs: clear to ausculatation and percussion; no wheezing or rales Chest wall: without tenderness to palpitation Heart: PMI not displaced, RRR, s1 s2 normal, 1/6 systolic murmur, no diastolic murmur, no rubs, gallops, thrills, or heaves Abdomen: soft, nontender; no hepatosplenomehaly, BS+; abdominal aorta nontender and not dilated by palpation. Back: no CVA tenderness Pulses 2+ Musculoskeletal: full range of motion, normal strength, no joint deformities Extremities: Heberden's nodes on her distal phalanges; trace edema left leg; no clubbing cyanosis, Homan's sign negative  Neurologic: grossly nonfocal; Cranial nerves grossly wnl Psychologic: Normal mood and affect   Studies/Labs Reviewed:   EKG:  EKG is ordered today.  ECG (independently read by me): Normal sinus rhythm at 82 bpm.  No ectopy.  Normal intervals.  Recent Labs: BMP Latest Ref Rng & Units 11/24/2017 02/06/2017 01/20/2017  Glucose 65 - 99 mg/dL 88 108(H) 91  BUN 7 - 25 mg/dL '19 23 17  '$ Creatinine 0.60 - 0.93 mg/dL 0.92 0.94 0.90  BUN/Creat Ratio 6 - 22 (calc) NOT APPLICABLE - NOT APPLICABLE  Sodium 893 - 146 mmol/L 140 139 142  Potassium 3.5 - 5.3 mmol/L 4.3 4.2 4.7  Chloride 98 - 110 mmol/L 106 103 106  CO2 20 - 32 mmol/L '26 29 29  '$ Calcium 8.6 - 10.4 mg/dL 10.2 9.6 10.7(H)     Hepatic Function Latest Ref Rng & Units 11/24/2017 02/06/2017 01/20/2017  Total Protein 6.1 - 8.1 g/dL 6.8 6.8 6.8  Albumin 3.5 - 5.2 g/dL - 4.2 -  AST 10 - 35 U/L '17 18 26  '$ ALT 6 - 29 U/L 20 24 34(H)  Alk Phosphatase 39 - 117 U/L - 64 -  Total Bilirubin 0.2 - 1.2 mg/dL 1.1 1.0 1.1    CBC Latest Ref Rng & Units 11/24/2017 01/20/2017 07/10/2016  WBC 3.8 - 10.8 Thousand/uL 6.4 6.9 7.1  Hemoglobin 11.7 - 15.5 g/dL 14.8 14.7 14.4  Hematocrit 35.0 - 45.0 % 42.7 42.3 42.6  Platelets 140 - 400 Thousand/uL 307 297 284   Lab Results  Component Value Date   MCV 88.8 11/24/2017   MCV  88.9 01/20/2017   MCV 91.0 07/10/2016   Lab Results  Component Value Date  TSH 3.99 12/24/2017   No results found for: HGBA1C   BNP No results found for: BNP  ProBNP No results found for: PROBNP   Lipid Panel     Component Value Date/Time   CHOL 158 12/24/2017 1121   TRIG 211.0 (H) 12/24/2017 1121   HDL 44.80 12/24/2017 1121   CHOLHDL 4 12/24/2017 1121   VLDL 42.2 (H) 12/24/2017 1121   LDLCALC 85 11/16/2015   LDLDIRECT 94.0 12/24/2017 1121     RADIOLOGY: No results found.   Additional studies/ records that were reviewed today include:  I reviewed the patient's valuations from Dr. Haroldine Laws and Chase Caller.  I reviewed her prior echo Doppler studies, chest CT and laboratory.  ASSESSMENT:    1. Coronary artery calcification seen on CAT scan   2. Mixed hyperlipidemia   3. Essential hypertension   4. Scleroderma ([place])   5. Interstitial lung disease [facility])      PLAN:  Ms. Kidada Ging is a 72 year old female who has a history of hypertension, significant mixed hyperlipidemia with prior triglycerides at 669 in 2018, remote history of TIA, lower extremity varicose veins, as well as a biopsy-proven history of scleroderma.  She has not been found to have pulmonary hypertension.  She is felt to have possible early findings of social lung disease which she has been seen by Dr. Chase Caller.  I reviewed her most recent high-resolution chest CT which revealed aortic atherosclerosis in addition to multivessel plaque.  With her marked hyperlipidemia I am recommending further evaluation and have suggested she undergo coronary CT angiography for further elucidation of her coronary anatomy.  She has significant mixed hyperlipidemia and her most recent triglycerides have significantly improved on the Sepra in addition to Livalo.  However she is only been taking Livalo once to at most 2 days/week.  Her LDL cholesterols have been consistently over 70 and when last checked LDL cholesterol was 94.   I have recommended she at least increase Livalo to every other day and if she tolerates this regimen to then increase this to daily.  I am also adding Zetia 10 mg which should provide an additional 20% lowering.  She has trace edema of her left leg.  We discussed sodium restriction.  Her blood pressure today is stable on losartan 100 mg daily and she also takes furosemide on an as-needed basis depending upon edema.  She most likely has osteoarthritis by virtue of her her Heberden's nodes.  He snores and in the past sleep studies have been discussed but she has refused.  She believes her sleep is restorative.  She wakes up 1 time per night for nocturia.  She denies excessive daytime sleepiness.  I have recommended repeat laboratory be obtained in early January prior to her trip to [place].  I will see her prior to her trip if her coronary CTA suggests significant abnormality.  Otherwise I will see her upon her return from [place] or sooner as needed.   Medication Adjustments/Labs and Tests Ordered: Current medicines are reviewed at length with the patient today.  Concerns regarding medicines are outlined above.  Medication changes, Labs and Tests ordered today are listed in the Patient Instructions below. Patient Instructions  Medication Instructions:  START Zetia 10 mg daily INCREASE Livalo to every other day  If you need a refill on your cardiac medications before your next appointment, please call your pharmacy.   Lab work: Please return for FASTING labs prior to leaving for [place] and prior to [place] ([place])  Our in office lab hours are Monday-Friday 8:00-4:00, closed for lunch 12:45-1:45 pm.  No appointment needed.  If you have labs (blood work) drawn today and your tests are completely normal, you will receive your results only by: Marland Kitchen MyChart Message (if you have MyChart) OR . A paper copy in the mail If you have any lab test that is abnormal or we need to change your treatment, we will  call you to review the results.  Testing/Procedures: Your physician has requested that you have cardiac CT. Cardiac computed tomography (CT) is a painless test that uses an x-ray machine to take clear, detailed pictures of your heart. For further information please visit HugeFiesta.tn. Please follow instruction sheet as given.  Follow-Up: At [facility], you and your health needs are our priority.  As part of our continuing mission to provide you with exceptional heart care, we have created designated Provider Care Teams.  These Care Teams include your primary Cardiologist (physician) and Advanced Practice Providers (APPs -  Physician Assistants and Nurse Practitioners) who all work together to provide you with the care you need, when you need it. You will need a follow up appointment in 7 months (when you return from [place]).  Please call our office 2 months in advance to schedule this appointment.  You may see  Dr. Claiborne Billings or one of the following Advanced Practice Providers on your designated Care Team: [place], [place] . Fabian Sharp, PA-C    Time spent: 45 minutes  Signed, Shelva Majestic, MD  01/20/2018 7:51 PM    [facility] [address], [place], [place], [facility]  [address] Phone: [phone]

## 2018-01-18 NOTE — Patient Instructions (Signed)
Medication Instructions:  START Zetia 10 mg daily INCREASE Livalo to every other day  If you need a refill on your cardiac medications before your next appointment, please call your pharmacy.   Lab work: Please return for FASTING labs prior to leaving for [place] and prior to CT (CMET,Lipid)  Our in office lab hours are Monday-Friday 8:00-4:00, closed for lunch 12:45-1:45 pm.  No appointment needed.  If you have labs (blood work) drawn today and your tests are completely normal, you will receive your results only by: Marland Kitchen MyChart Message (if you have MyChart) OR . A paper copy in the mail If you have any lab test that is abnormal or we need to change your treatment, we will call you to review the results.  Testing/Procedures: Your physician has requested that you have cardiac CT. Cardiac computed tomography (CT) is a painless test that uses an x-ray machine to take clear, detailed pictures of your heart. For further information please visit HugeFiesta.tn. Please follow instruction sheet as given.  Follow-Up: At [facility], you and your health needs are our priority.  As part of our continuing mission to provide you with exceptional heart care, we have created designated Provider Care Teams.  These Care Teams include your primary Cardiologist (physician) and Advanced Practice Providers (APPs -  Physician Assistants and Nurse Practitioners) who all work together to provide you with the care you need, when you need it. You will need a follow up appointment in 7 months (when you return from [place]).  Please call our office 2 months in advance to schedule this appointment.  You may see  Dr. Claiborne Billings or one of the following Advanced Practice Providers on your designated Care Team: [place], [place] . Fabian Sharp, PA-C

## 2018-01-20 ENCOUNTER — Encounter: Payer: Self-pay | Admitting: Cardiovascular Disease

## 2018-02-01 DIAGNOSIS — H35372 Puckering of macula, left eye: Secondary | ICD-10-CM | POA: Diagnosis not present

## 2018-02-01 DIAGNOSIS — Z8673 Personal history of transient ischemic attack (TIA), and cerebral infarction without residual deficits: Secondary | ICD-10-CM | POA: Diagnosis not present

## 2018-02-01 DIAGNOSIS — H16143 Punctate keratitis, bilateral: Secondary | ICD-10-CM | POA: Diagnosis not present

## 2018-02-01 DIAGNOSIS — H2513 Age-related nuclear cataract, bilateral: Secondary | ICD-10-CM | POA: Diagnosis not present

## 2018-02-04 ENCOUNTER — Encounter (HOSPITAL_COMMUNITY): Payer: Medicare Other | Admitting: Internal Medicine

## 2018-02-04 ENCOUNTER — Other Ambulatory Visit (HOSPITAL_COMMUNITY): Payer: Medicare Other

## 2018-02-24 DIAGNOSIS — E782 Mixed hyperlipidemia: Secondary | ICD-10-CM | POA: Diagnosis not present

## 2018-02-24 DIAGNOSIS — I2510 Atherosclerotic heart disease of native coronary artery without angina pectoris: Secondary | ICD-10-CM | POA: Diagnosis not present

## 2018-02-24 LAB — COMPREHENSIVE METABOLIC PANEL
A/G RATIO: 2.1 (ref 1.2–2.2)
ALBUMIN: 4.5 g/dL (ref 3.5–4.8)
ALT: 35 IU/L — ABNORMAL HIGH (ref 0–32)
AST: 21 IU/L (ref 0–40)
Alkaline Phosphatase: 71 IU/L (ref 39–117)
BILIRUBIN TOTAL: 1.3 mg/dL — AB (ref 0.0–1.2)
BUN / CREAT RATIO: 20 (ref 12–28)
BUN: 17 mg/dL (ref 8–27)
CHLORIDE: 104 mmol/L (ref 96–106)
CO2: 22 mmol/L (ref 20–29)
Calcium: 10.1 mg/dL (ref 8.7–10.3)
Creatinine, Ser: 0.84 mg/dL (ref 0.57–1.00)
GFR, EST AFRICAN AMERICAN: 80 mL/min/{1.73_m2} (ref >59)
GFR, EST NON AFRICAN AMERICAN: 69 mL/min/{1.73_m2} (ref >59)
Globulin, Total: 2.1 g/dL (ref 1.5–4.5)
Glucose: 102 mg/dL — ABNORMAL HIGH (ref 65–99)
POTASSIUM: 4.4 mmol/L (ref 3.5–5.2)
Sodium: 140 mmol/L (ref 134–144)
TOTAL PROTEIN: 6.6 g/dL (ref 6.0–8.5)

## 2018-02-24 LAB — LIPID PANEL
CHOL/HDL RATIO: 3 ratio (ref 0.0–4.4)
Cholesterol, Total: 134 mg/dL (ref 100–199)
HDL: 44 mg/dL (ref >39)
LDL Calculated: 54 mg/dL (ref 0–99)
Triglycerides: 178 mg/dL — ABNORMAL HIGH (ref 0–149)
VLDL CHOLESTEROL CAL: 36 mg/dL (ref 5–40)

## 2018-02-26 ENCOUNTER — Telehealth (HOSPITAL_COMMUNITY): Payer: Self-pay | Admitting: Emergency Medicine

## 2018-02-26 NOTE — Telephone Encounter (Signed)
Unable to leave callback name and number as mailbox is full . Will try again  Marchia Bond RN Navigator Cardiac Imaging [phone]

## 2018-03-01 ENCOUNTER — Ambulatory Visit (HOSPITAL_COMMUNITY)
Admission: RE | Admit: 2018-03-01 | Discharge: 2018-03-01 | Disposition: A | Discharge status: Home / Self Care | Payer: Medicare Other | Source: Ambulatory Visit | Attending: Cardiovascular Disease | Admitting: Cardiovascular Disease

## 2018-03-01 ENCOUNTER — Ambulatory Visit (HOSPITAL_COMMUNITY): Admission: RE | Admit: 2018-03-01 | Payer: Medicare Other | Source: Ambulatory Visit

## 2018-03-01 ENCOUNTER — Encounter (HOSPITAL_COMMUNITY): Payer: Self-pay

## 2018-03-01 DIAGNOSIS — I2510 Atherosclerotic heart disease of native coronary artery without angina pectoris: Secondary | ICD-10-CM | POA: Diagnosis not present

## 2018-03-01 MED ORDER — METOPROLOL TARTRATE 5 MG/5ML IV SOLN
INTRAVENOUS | Status: AC
  Administered 2018-03-01: 5 mg via INTRAVENOUS
  Filled 2018-03-01: qty 10

## 2018-03-01 MED ORDER — METOPROLOL TARTRATE 5 MG/5ML IV SOLN
5.0000 mg | INTRAVENOUS | Status: DC | PRN
  Administered 2018-03-01: 5 mg via INTRAVENOUS
  Filled 2018-03-01: qty 5

## 2018-03-01 MED ORDER — IOPAMIDOL (ISOVUE-370) INJECTION 76%
100.0000 mL | Freq: Once | INTRAVENOUS | Status: AC | PRN
  Administered 2018-03-01: 100 mL via INTRAVENOUS

## 2018-03-01 MED ORDER — NITROGLYCERIN 0.4 MG SL SUBL
SUBLINGUAL_TABLET | SUBLINGUAL | Status: AC
  Administered 2018-03-01: 0.8 mg via SUBLINGUAL
  Filled 2018-03-01: qty 2

## 2018-03-01 MED ORDER — NITROGLYCERIN 0.4 MG SL SUBL
0.8000 mg | SUBLINGUAL_TABLET | Freq: Once | SUBLINGUAL | Status: AC
  Administered 2018-03-01: 0.8 mg via SUBLINGUAL
  Filled 2018-03-01: qty 25

## 2018-05-20 ENCOUNTER — Telehealth: Payer: Self-pay | Admitting: *Deleted

## 2018-05-20 NOTE — Telephone Encounter (Signed)
Called patient to schedule AWV. Patient states she will be in [place] until the end of June and asked that nurse call her back at that time to schedule the appointment.

## 2018-08-16 ENCOUNTER — Other Ambulatory Visit: Payer: Self-pay

## 2018-08-16 MED ORDER — LOSARTAN POTASSIUM 100 MG PO TABS: 100.0000 mg | ORAL_TABLET | Freq: Every day | ORAL | 0 refills | Status: DC

## 2018-09-07 ENCOUNTER — Telehealth: Payer: Self-pay | Admitting: Cardiovascular Disease

## 2018-09-07 NOTE — Telephone Encounter (Signed)
New message:    The patient is calling concering a appt for a FFR test. She would like for some one to call concerning some details. Please call patient at [phone]

## 2018-09-07 NOTE — Telephone Encounter (Signed)
Left message to call back  

## 2018-09-08 NOTE — Telephone Encounter (Signed)
Informed pt that the FFR is an additional testing that sent off if indicated by results of CTA. Per chart review, no additional testing was needed and advised pt to disregard notice. Pt voiced understanding.

## 2018-09-10 ENCOUNTER — Other Ambulatory Visit: Payer: Self-pay | Admitting: Internal Medicine

## 2018-09-11 ENCOUNTER — Other Ambulatory Visit: Payer: Self-pay | Admitting: Internal Medicine

## 2018-09-16 ENCOUNTER — Inpatient Hospital Stay: Admission: RE | Admit: 2018-09-16 | Payer: Medicare Other | Source: Ambulatory Visit

## 2018-11-03 NOTE — Progress Notes (Deleted)
Office Visit Note  Patient: Savannah [name]             Date of Birth: 1945/09/26           MRN: [id number]             PCP: Janith Lima, MD Referring: Janith Lima, MD Visit Date: 11/11/2018 Occupation: @GUAROCC @  Subjective:  No chief complaint on file.   History of Present Illness: Savannah [name] is a 73 y.o. female ***   Activities of Daily Living:  Patient reports morning stiffness for *** {minute/hour:19697}.   Patient {ACTIONS;DENIES/REPORTS:21021675::"Denies"} nocturnal pain.  Difficulty dressing/grooming: {ACTIONS;DENIES/REPORTS:21021675::"Denies"} Difficulty climbing stairs: {ACTIONS;DENIES/REPORTS:21021675::"Denies"} Difficulty getting out of chair: {ACTIONS;DENIES/REPORTS:21021675::"Denies"} Difficulty using hands for taps, buttons, cutlery, and/or writing: {ACTIONS;DENIES/REPORTS:21021675::"Denies"}  No Rheumatology ROS completed.   PMFS History:  Patient Active Problem List   Diagnosis Date Noted  . Hearing loss due to cerumen impaction, left 12/26/2017  . TSH elevation 02/07/2017  . Raynaud's syndrome without gangrene 07/02/2016  . Osteopenia of multiple sites 07/02/2016  . Vitamin D deficiency 07/02/2016  . Primary osteoarthritis of both knees 07/02/2016  . Primary osteoarthritis of both hands 07/02/2016  . Primary osteoarthritis of both feet 07/02/2016  . Pure hyperglyceridemia 12/27/2015  . Hyperglycemia 12/27/2015  . ILD (interstitial lung disease) ([place]) 12/10/2015  . Diastolic dysfunction 0000000  . HTN (hypertension) 10/07/2011  . Dyslipidemia, goal LDL below 160 10/07/2011  . Scleroderma ([place]) 10/07/2011  . Varicose veins 10/07/2011    Past Medical History:  Diagnosis Date  . Dyspnea    MYOVIEW, 12/29/2006 - post-stress EF 81%,no ECG changes, EKG negative for ischemia  . Hyperlipidemia   . Hypertension    2D ECHO, 12/24/2006 - EF 45-55%, normal  . Swelling of extremity    LEA VENOUS DUPLEX, 08/19/2011 - deep valvular insufficiency noted  with right femoral and popliteal veins and left common femoral vein  . TIA (transient ischemic attack)    CAROTID DOPPLER, 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    Family History  Problem Relation Age of Onset  . Hyperlipidemia Father   . Cirrhosis Father 13  . Kidney failure Father 19  . Stroke Maternal Grandmother 85  . Hypertension Maternal Grandmother 40  . Angina Maternal Grandfather 55  . Heart attack Maternal Grandfather 88  . Lung disease Paternal Grandmother   . Atrial fibrillation Mother 49  . Hypertension Mother    No past surgical history on file. Social History   Social History Narrative  . Not on file   Immunization History  Administered Date(s) Administered  . Pneumococcal Conjugate-13 11/20/2014  . Pneumococcal Polysaccharide-23 11/17/2013  . Tdap 07/29/2016  . Zoster 12/09/2009     Objective: Vital Signs: There were no vitals taken for this visit.   Physical Exam   Musculoskeletal Exam: ***  CDAI Exam: CDAI Score: - Patient Global: -; Provider Global: - Swollen: -; Tender: - Joint Exam   No joint exam has been documented for this visit   There is currently no information documented on the homunculus. Go to the Rheumatology activity and complete the homunculus joint exam.  Investigation: No additional findings.  Imaging: No results found.  Recent Labs: Lab Results  Component Value Date   WBC 6.4 11/24/2017   HGB 14.8 11/24/2017   PLT 307 11/24/2017   NA 140 02/24/2018   K 4.4 02/24/2018   CL 104 02/24/2018   CO2 22 02/24/2018   GLUCOSE 102 (H) 02/24/2018  BUN 17 02/24/2018   CREATININE 0.84 02/24/2018   BILITOT 1.3 (H) 02/24/2018   ALKPHOS 71 02/24/2018   AST 21 02/24/2018   ALT 35 (H) 02/24/2018   PROT 6.6 02/24/2018   ALBUMIN 4.5 02/24/2018   CALCIUM 10.1 02/24/2018   GFRAA 80 02/24/2018    Speciality Comments: No specialty comments available.  Procedures:  No procedures performed  Allergies: Codeine, Cymbalta [duloxetine hcl], Lyrica [pregabalin], Penicillins, Sulfa antibiotics, Zetia [ezetimibe], and Zocor [simvastatin]   Assessment / Plan:     Visit Diagnoses: No diagnosis found.  Orders: No orders of the defined types were placed in this encounter.  No orders of the defined types were placed in this encounter.   Face-to-face time spent with patient was *** minutes. Greater than 50% of time was spent in counseling and coordination of care.  Follow-Up Instructions: No follow-ups on file.   Ofilia Neas, PA-C  Note - This record has been created using Dragon software.  Chart creation errors have been sought, but may not always  have been located. Such creation errors do not reflect on  the standard of medical care.

## 2018-11-04 ENCOUNTER — Other Ambulatory Visit: Payer: Self-pay | Admitting: Cardiovascular Disease

## 2018-11-08 ENCOUNTER — Ambulatory Visit: Payer: Medicare Other | Admitting: Rheumatology

## 2018-11-09 NOTE — Progress Notes (Signed)
Office Visit Note  Patient: Savannah [name]             Date of Birth: 06-Mar-1945           MRN: [id number]             PCP: Janith Lima, MD Referring: Janith Lima, MD Visit Date: 11/23/2018 Occupation: @GUAROCC @  Subjective:  Pain in both knees.   History of Present Illness: Savannah [name] is a 73 y.o. female history of scleroderma and osteoarthritis.  She states she has been experiencing increased knee joint discomfort.  She denies any joint swelling.  She denies any increased tightness of the skin or Raynaud's phenomenon.  She is a scheduled to have repeat high-resolution CT and PFTs.  She has been followed by Dr. Chase Caller.  She also has appointment coming up with the cardiology in October.  Activities of Daily Living:  Patient reports morning stiffness for 5-10 minutes.   Patient Reports nocturnal pain.  Difficulty dressing/grooming: Denies Difficulty climbing stairs: Reports Difficulty getting out of chair: Reports Difficulty using hands for taps, buttons, cutlery, and/or writing: Reports  Review of Systems  Constitutional: Negative for fatigue, night sweats, weight gain and weight loss.  HENT: Negative for mouth sores, trouble swallowing, trouble swallowing, mouth dryness and nose dryness.   Eyes: Positive for dryness. Negative for pain, redness, itching and visual disturbance.  Respiratory: Negative for cough, shortness of breath, wheezing and difficulty breathing.   Cardiovascular: Negative for chest pain, palpitations, hypertension, irregular heartbeat and swelling in legs/feet.  Gastrointestinal: Negative for blood in stool, constipation and diarrhea.  Endocrine: Negative for increased urination.  Genitourinary: Negative for difficulty urinating, painful urination and vaginal dryness.  Musculoskeletal: Positive for arthralgias, joint pain and morning stiffness. Negative for joint swelling, myalgias, muscle weakness, muscle tenderness and myalgias.  Skin: Positive for  skin tightness. Negative for color change, rash, hair loss, ulcers and sensitivity to sunlight.  Allergic/Immunologic: Negative for susceptible to infections.  Neurological: Negative for dizziness, light-headedness, headaches, memory loss, night sweats and weakness.  Hematological: Negative for bruising/bleeding tendency and swollen glands.  Psychiatric/Behavioral: Negative for depressed mood, confusion and sleep disturbance. The patient is not nervous/anxious.     PMFS History:  Patient Active Problem List   Diagnosis Date Noted   Hearing loss due to cerumen impaction, left 12/26/2017   TSH elevation 02/07/2017   Raynaud's syndrome without gangrene 07/02/2016   Osteopenia of multiple sites 07/02/2016   Vitamin D deficiency 07/02/2016   Primary osteoarthritis of both knees 07/02/2016   Primary osteoarthritis of both hands 07/02/2016   Primary osteoarthritis of both feet 07/02/2016   Pure hyperglyceridemia 12/27/2015   Hyperglycemia 12/27/2015   ILD (interstitial lung disease) ([place]) 0000000   Diastolic dysfunction 0000000   HTN (hypertension) 10/07/2011   Dyslipidemia, goal LDL below 160 10/07/2011   Scleroderma ([place]) 10/07/2011   Varicose veins 10/07/2011    Past Medical History:  Diagnosis Date   Dyspnea    MYOVIEW, 12/29/2006 - post-stress EF 81%,no ECG changes, EKG negative for ischemia   Hyperlipidemia    Hypertension    2D ECHO, 12/24/2006 - EF 45-55%, normal   Swelling of extremity    LEA VENOUS DUPLEX, 08/19/2011 - deep valvular insufficiency noted with right femoral and popliteal veins and left common femoral vein   TIA (transient ischemic attack)    CAROTID DOPPLER, 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    Family History  Problem Relation Age of  Onset   Hyperlipidemia Father    Cirrhosis Father 43   Kidney failure Father 72   Stroke Maternal Grandmother 85   Hypertension Maternal Grandmother 40   Angina  Maternal Grandfather 37   Heart attack Maternal Grandfather 88   Lung disease Paternal Grandmother    Atrial fibrillation Mother 55   Hypertension Mother    History reviewed. No pertinent surgical history. Social History   Social History Narrative   Not on file   Immunization History  Administered Date(s) Administered   Pneumococcal Conjugate-13 11/20/2014   Pneumococcal Polysaccharide-23 11/17/2013   Tdap 07/29/2016   Zoster 12/09/2009     Objective: Vital Signs: BP 117/64 (BP Location: Left Arm, Patient Position: Sitting, Cuff Size: Normal)    Pulse 83    Resp 14    Ht 5\' 7"  (1.702 m)    Wt 184 lb 9.6 oz (83.7 kg)    BMI 28.91 kg/m    Physical Exam Vitals signs and nursing note reviewed.  Constitutional:      Appearance: She is well-developed.  HENT:     Head: Normocephalic and atraumatic.  Eyes:     Conjunctiva/sclera: Conjunctivae normal.  Neck:     Musculoskeletal: Normal range of motion.  Cardiovascular:     Rate and Rhythm: Normal rate and regular rhythm.     Heart sounds: Normal heart sounds.  Pulmonary:     Effort: Pulmonary effort is normal.     Breath sounds: Normal breath sounds.  Abdominal:     General: Bowel sounds are normal.     Palpations: Abdomen is soft.  Lymphadenopathy:     Cervical: No cervical adenopathy.  Skin:    General: Skin is warm and dry.     Capillary Refill: Capillary refill takes less than 2 seconds.     Comments: sclerodactyly  Neurological:     Mental Status: She is alert and oriented to person, place, and time.  Psychiatric:        Behavior: Behavior normal.      Musculoskeletal Exam: C-spine was in good range of motion.  Shoulder joints, elbow joints, wrist joints with good range of motion.  She has DIP and PIP thickening with subluxation of some of the DIP joints.  Hip joints with good range of motion.  She has discomfort with range of motion of bilateral knee joints.  She had good range of motion of her ankle  joints.  CDAI Exam: CDAI Score: -- Patient Global: --; Provider Global: -- Swollen: --; Tender: -- Joint Exam   No joint exam has been documented for this visit   There is currently no information documented on the homunculus. Go to the Rheumatology activity and complete the homunculus joint exam.  Investigation: No additional findings.  Imaging: No results found.  Recent Labs: Lab Results  Component Value Date   WBC 6.4 11/24/2017   HGB 14.8 11/24/2017   PLT 307 11/24/2017   NA 140 02/24/2018   K 4.4 02/24/2018   CL 104 02/24/2018   CO2 22 02/24/2018   GLUCOSE 102 (H) 02/24/2018   BUN 17 02/24/2018   CREATININE 0.84 02/24/2018   BILITOT 1.3 (H) 02/24/2018   ALKPHOS 71 02/24/2018   AST 21 02/24/2018   ALT 35 (H) 02/24/2018   PROT 6.6 02/24/2018   ALBUMIN 4.5 02/24/2018   CALCIUM 10.1 02/24/2018   GFRAA 80 02/24/2018    Speciality Comments: No specialty comments available.  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  No progression of sclerodactyly was noted.  To monitor for disease process she will need following labs which she will do with her cardiologist.  Plan: CBC with Differential/Platelet, COMPLETE METABOLIC PANEL WITH GFR, Urinalysis, Routine w reflex microscopic  ILD (interstitial lung disease) ([place]) -she is followed by Dr. Chase Caller.  She has high-resolution CT pending.  She also has PFTs scheduled.  Raynaud's syndrome without gangrene-her Raynaud's is currently not active.  Primary osteoarthritis of both hands-she has severe osteoarthritis in her hands.  Joint protection muscle strengthening was discussed.  Chronic pain of both knees - Plan: XR KNEE 3 VIEW RIGHT, XR KNEE 3 VIEW  LEFT.  X-ray showed right knee joint severe osteoarthritis and severe chondromalacia patella.  Left knee joint showed moderate osteoarthritis and severe chondromalacia patella.  Joint protection muscle strengthening was discussed.  She will need a total knee replacement on her right knee in the future.  She can benefit from Visco supplement injections in the meantime.  We will apply for Visco supplement injections.  She would prefer to get them in November.  Weight loss diet and exercise was also discussed at length.  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  Bilateral carpal tunnel syndrome  Osteopenia of multiple sites-she takes calcium and vitamin D.  History of vitamin D deficiency  History of diverticulosis  History of hyperlipidemia  History of hypertension  Physical deconditioning  Orders: Orders Placed This Encounter  Procedures   XR KNEE 3 VIEW RIGHT   XR KNEE 3 VIEW LEFT   CBC with Differential/Platelet   COMPLETE METABOLIC PANEL WITH GFR   Urinalysis, Routine w reflex microscopic   No orders of the defined types were placed in this encounter.   Face-to-face time spent with patient was 30 minutes. Greater than 50% of time was spent in counseling and coordination of care.  Follow-Up Instructions: Return in about 5 months (around 04/23/2019) for Scleroderma.   Bo Merino, MD  Note - This record has been created using Editor, commissioning.  Chart creation errors have been sought, but may not always  have been located. Such creation errors do not reflect on  the standard of medical care.

## 2018-11-11 ENCOUNTER — Ambulatory Visit: Payer: Medicare Other | Admitting: Rheumatology

## 2018-11-15 ENCOUNTER — Inpatient Hospital Stay: Admission: RE | Admit: 2018-11-15 | Payer: Medicare Other | Source: Ambulatory Visit

## 2018-11-23 ENCOUNTER — Ambulatory Visit (INDEPENDENT_AMBULATORY_CARE_PROVIDER_SITE_OTHER): Payer: Medicare Other

## 2018-11-23 ENCOUNTER — Other Ambulatory Visit: Payer: Self-pay

## 2018-11-23 ENCOUNTER — Telehealth: Payer: Self-pay

## 2018-11-23 ENCOUNTER — Ambulatory Visit: Payer: Self-pay

## 2018-11-23 ENCOUNTER — Encounter: Payer: Self-pay | Admitting: Rheumatology

## 2018-11-23 ENCOUNTER — Ambulatory Visit (INDEPENDENT_AMBULATORY_CARE_PROVIDER_SITE_OTHER): Payer: Medicare Other | Admitting: Rheumatology

## 2018-11-23 VITALS — BP 117/64 | HR 83 | Resp 14 | Ht 67.0 in | Wt 184.6 lb

## 2018-11-23 DIAGNOSIS — M25561 Pain in right knee: Secondary | ICD-10-CM

## 2018-11-23 DIAGNOSIS — I2510 Atherosclerotic heart disease of native coronary artery without angina pectoris: Secondary | ICD-10-CM

## 2018-11-23 DIAGNOSIS — M170 Bilateral primary osteoarthritis of knee: Secondary | ICD-10-CM

## 2018-11-23 DIAGNOSIS — J849 Interstitial pulmonary disease, unspecified: Secondary | ICD-10-CM | POA: Diagnosis not present

## 2018-11-23 DIAGNOSIS — M19041 Primary osteoarthritis, right hand: Secondary | ICD-10-CM | POA: Diagnosis not present

## 2018-11-23 DIAGNOSIS — M349 Systemic sclerosis, unspecified: Secondary | ICD-10-CM

## 2018-11-23 DIAGNOSIS — G8929 Other chronic pain: Secondary | ICD-10-CM

## 2018-11-23 DIAGNOSIS — M8589 Other specified disorders of bone density and structure, multiple sites: Secondary | ICD-10-CM | POA: Diagnosis not present

## 2018-11-23 DIAGNOSIS — M19071 Primary osteoarthritis, right ankle and foot: Secondary | ICD-10-CM

## 2018-11-23 DIAGNOSIS — R5381 Other malaise: Secondary | ICD-10-CM

## 2018-11-23 DIAGNOSIS — G5603 Carpal tunnel syndrome, bilateral upper limbs: Secondary | ICD-10-CM

## 2018-11-23 DIAGNOSIS — I7300 Raynaud's syndrome without gangrene: Secondary | ICD-10-CM

## 2018-11-23 DIAGNOSIS — M25562 Pain in left knee: Secondary | ICD-10-CM

## 2018-11-23 DIAGNOSIS — Z8679 Personal history of other diseases of the circulatory system: Secondary | ICD-10-CM

## 2018-11-23 DIAGNOSIS — Z8719 Personal history of other diseases of the digestive system: Secondary | ICD-10-CM

## 2018-11-23 DIAGNOSIS — M19072 Primary osteoarthritis, left ankle and foot: Secondary | ICD-10-CM

## 2018-11-23 DIAGNOSIS — Z8639 Personal history of other endocrine, nutritional and metabolic disease: Secondary | ICD-10-CM | POA: Diagnosis not present

## 2018-11-23 DIAGNOSIS — M19042 Primary osteoarthritis, left hand: Secondary | ICD-10-CM

## 2018-11-23 NOTE — Telephone Encounter (Signed)
APPLIED

## 2018-11-23 NOTE — Telephone Encounter (Signed)
Please apply for bilateral knee visco, per Dr. Estanislado Pandy. Patient is requesting to have the injections in November. Thanks!

## 2018-11-23 NOTE — Patient Instructions (Signed)
Journal for Nurse Practitioners, 15(4), 263-267. Retrieved November 23, 2017 from http://clinicalkey.com/nursing">  Knee Exercises Ask your health care provider which exercises are safe for you. Do exercises exactly as told by your health care provider and adjust them as directed. It is normal to feel mild stretching, pulling, tightness, or discomfort as you do these exercises. Stop right away if you feel sudden pain or your pain gets worse. Do not begin these exercises until told by your health care provider. Stretching and range-of-motion exercises These exercises warm up your muscles and joints and improve the movement and flexibility of your knee. These exercises also help to relieve pain and swelling. Knee extension, prone 1. Lie on your abdomen (prone position) on a bed. 2. Place your left / right knee just beyond the edge of the surface so your knee is not on the bed. You can put a towel under your left / right thigh just above your kneecap for comfort. 3. Relax your leg muscles and allow gravity to straighten your knee (extension). You should feel a stretch behind your left / right knee. 4. Hold this position for __________ seconds. 5. Scoot up so your knee is supported between repetitions. Repeat __________ times. Complete this exercise __________ times a day. Knee flexion, active  1. Lie on your back with both legs straight. If this causes back discomfort, bend your left / right knee so your foot is flat on the floor. 2. Slowly slide your left / right heel back toward your buttocks. Stop when you feel a gentle stretch in the front of your knee or thigh (flexion). 3. Hold this position for __________ seconds. 4. Slowly slide your left / right heel back to the starting position. Repeat __________ times. Complete this exercise __________ times a day. Quadriceps stretch, prone  1. Lie on your abdomen on a firm surface, such as a bed or padded floor. 2. Bend your left / right knee and hold  your ankle. If you cannot reach your ankle or pant leg, loop a belt around your foot and grab the belt instead. 3. Gently pull your heel toward your buttocks. Your knee should not slide out to the side. You should feel a stretch in the front of your thigh and knee (quadriceps). 4. Hold this position for __________ seconds. Repeat __________ times. Complete this exercise __________ times a day. Hamstring, supine 1. Lie on your back (supine position). 2. Loop a belt or towel over the ball of your left / right foot. The ball of your foot is on the walking surface, right under your toes. 3. Straighten your left / right knee and slowly pull on the belt to raise your leg until you feel a gentle stretch behind your knee (hamstring). ? Do not let your knee bend while you do this. ? Keep your other leg flat on the floor. 4. Hold this position for __________ seconds. Repeat __________ times. Complete this exercise __________ times a day. Strengthening exercises These exercises build strength and endurance in your knee. Endurance is the ability to use your muscles for a long time, even after they get tired. Quadriceps, isometric This exercise stretches the muscles in front of your thigh (quadriceps) without moving your knee joint (isometric). 1. Lie on your back with your left / right leg extended and your other knee bent. Put a rolled towel or small pillow under your knee if told by your health care provider. 2. Slowly tense the muscles in the front of your left /   right thigh. You should see your kneecap slide up toward your hip or see increased dimpling just above the knee. This motion will push the back of the knee toward the floor. 3. For __________ seconds, hold the muscle as tight as you can without increasing your pain. 4. Relax the muscles slowly and completely. Repeat __________ times. Complete this exercise __________ times a day. Straight leg raises This exercise stretches the muscles in front  of your thigh (quadriceps) and the muscles that move your hips (hip flexors). 1. Lie on your back with your left / right leg extended and your other knee bent. 2. Tense the muscles in the front of your left / right thigh. You should see your kneecap slide up or see increased dimpling just above the knee. Your thigh may even shake a bit. 3. Keep these muscles tight as you raise your leg 4-6 inches (10-15 cm) off the floor. Do not let your knee bend. 4. Hold this position for __________ seconds. 5. Keep these muscles tense as you lower your leg. 6. Relax your muscles slowly and completely after each repetition. Repeat __________ times. Complete this exercise __________ times a day. Hamstring, isometric 1. Lie on your back on a firm surface. 2. Bend your left / right knee about __________ degrees. 3. Dig your left / right heel into the surface as if you are trying to pull it toward your buttocks. Tighten the muscles in the back of your thighs (hamstring) to "dig" as hard as you can without increasing any pain. 4. Hold this position for __________ seconds. 5. Release the tension gradually and allow your muscles to relax completely for __________ seconds after each repetition. Repeat __________ times. Complete this exercise __________ times a day. Hamstring curls If told by your health care provider, do this exercise while wearing ankle weights. Begin with __________ lb weights. Then increase the weight by 1 lb (0.5 kg) increments. Do not wear ankle weights that are more than __________ lb. 1. Lie on your abdomen with your legs straight. 2. Bend your left / right knee as far as you can without feeling pain. Keep your hips flat against the floor. 3. Hold this position for __________ seconds. 4. Slowly lower your leg to the starting position. Repeat __________ times. Complete this exercise __________ times a day. Squats This exercise strengthens the muscles in front of your thigh and knee  (quadriceps). 1. Stand in front of a table, with your feet and knees pointing straight ahead. You may rest your hands on the table for balance but not for support. 2. Slowly bend your knees and lower your hips like you are going to sit in a chair. ? Keep your weight over your heels, not over your toes. ? Keep your lower legs upright so they are parallel with the table legs. ? Do not let your hips go lower than your knees. ? Do not bend lower than told by your health care provider. ? If your knee pain increases, do not bend as low. 3. Hold the squat position for __________ seconds. 4. Slowly push with your legs to return to standing. Do not use your hands to pull yourself to standing. Repeat __________ times. Complete this exercise __________ times a day. Wall slides This exercise strengthens the muscles in front of your thigh and knee (quadriceps). 1. Lean your back against a smooth wall or door, and walk your feet out 18-24 inches (46-61 cm) from it. 2. Place your feet hip-width apart. 3.   Slowly slide down the wall or door until your knees bend __________ degrees. Keep your knees over your heels, not over your toes. Keep your knees in line with your hips. 4. Hold this position for __________ seconds. Repeat __________ times. Complete this exercise __________ times a day. Straight leg raises This exercise strengthens the muscles that rotate the leg at the hip and move it away from your body (hip abductors). 1. Lie on your side with your left / right leg in the top position. Lie so your head, shoulder, knee, and hip line up. You may bend your bottom knee to help you keep your balance. 2. Roll your hips slightly forward so your hips are stacked directly over each other and your left / right knee is facing forward. 3. Leading with your heel, lift your top leg 4-6 inches (10-15 cm). You should feel the muscles in your outer hip lifting. ? Do not let your foot drift forward. ? Do not let your knee  roll toward the ceiling. 4. Hold this position for __________ seconds. 5. Slowly return your leg to the starting position. 6. Let your muscles relax completely after each repetition. Repeat __________ times. Complete this exercise __________ times a day. Straight leg raises This exercise stretches the muscles that move your hips away from the front of the pelvis (hip extensors). 1. Lie on your abdomen on a firm surface. You can put a pillow under your hips if that is more comfortable. 2. Tense the muscles in your buttocks and lift your left / right leg about 4-6 inches (10-15 cm). Keep your knee straight as you lift your leg. 3. Hold this position for __________ seconds. 4. Slowly lower your leg to the starting position. 5. Let your leg relax completely after each repetition. Repeat __________ times. Complete this exercise __________ times a day. This information is not intended to replace advice given to you by your health care provider. Make sure you discuss any questions you have with your health care provider. Document Released: 12/18/2004 Document Revised: 11/24/2017 Document Reviewed: 11/24/2017 [employer] Patient Education  2020 [employer]  

## 2018-11-24 NOTE — Telephone Encounter (Signed)
Please schedule patient appointments with Savannah [name] for gel injections in November.  Approved for Euflexxa series, Bilateral Knees. Buy & Bill Medicare/ secondary covers at 100%. No Co-pay No Prior Auth required.

## 2018-11-30 ENCOUNTER — Telehealth: Payer: Self-pay | Admitting: Cardiovascular Disease

## 2018-11-30 NOTE — Telephone Encounter (Signed)
Called patient, she thought someone called regarding the blood work from her other doctor- to try and have them drawn with Dr.Kellys lab at the same time so she doesn't have be stuck twice- I advised I did not see an issue with that when she is seen on 10/19. Patient verbalized understanding.

## 2018-11-30 NOTE — Telephone Encounter (Signed)
New message:    Patient returning call from yesterday. I did not see a note. Please call patient.

## 2018-12-01 ENCOUNTER — Telehealth: Payer: Self-pay | Admitting: Rheumatology

## 2018-12-01 NOTE — Telephone Encounter (Signed)
Ok to give rx to PT

## 2018-12-01 NOTE — Telephone Encounter (Signed)
Patient called requesting a return call to let her know if Dr. Estanislado Pandy would recommend physical therapy.

## 2018-12-02 NOTE — Telephone Encounter (Signed)
Attempted to contact the patient and left message for patient to call the office.  

## 2018-12-03 ENCOUNTER — Telehealth: Payer: Self-pay | Admitting: Rheumatology

## 2018-12-03 DIAGNOSIS — G8929 Other chronic pain: Secondary | ICD-10-CM

## 2018-12-03 DIAGNOSIS — R5381 Other malaise: Secondary | ICD-10-CM

## 2018-12-03 DIAGNOSIS — M25561 Pain in right knee: Secondary | ICD-10-CM

## 2018-12-03 DIAGNOSIS — M170 Bilateral primary osteoarthritis of knee: Secondary | ICD-10-CM

## 2018-12-03 NOTE — Telephone Encounter (Signed)
Patient advised we will placed referral for Physical therapy. Patient requested referral for knees and shoulders. Patient states she would like to go to [facility].

## 2018-12-03 NOTE — Telephone Encounter (Signed)
Patient left a voicemail stating she was returning your call.   

## 2018-12-06 ENCOUNTER — Encounter: Payer: Self-pay | Admitting: Cardiovascular Disease

## 2018-12-06 ENCOUNTER — Other Ambulatory Visit: Payer: Self-pay

## 2018-12-06 ENCOUNTER — Ambulatory Visit (INDEPENDENT_AMBULATORY_CARE_PROVIDER_SITE_OTHER): Payer: Medicare Other | Admitting: Cardiovascular Disease

## 2018-12-06 VITALS — BP 116/76 | HR 80 | Temp 97.2°F | Ht 67.0 in | Wt 181.0 lb

## 2018-12-06 DIAGNOSIS — J849 Interstitial pulmonary disease, unspecified: Secondary | ICD-10-CM

## 2018-12-06 DIAGNOSIS — I2510 Atherosclerotic heart disease of native coronary artery without angina pectoris: Secondary | ICD-10-CM

## 2018-12-06 DIAGNOSIS — I700 Atherosclerosis of aorta: Secondary | ICD-10-CM

## 2018-12-06 DIAGNOSIS — Z79899 Other long term (current) drug therapy: Secondary | ICD-10-CM | POA: Diagnosis not present

## 2018-12-06 DIAGNOSIS — M349 Systemic sclerosis, unspecified: Secondary | ICD-10-CM | POA: Diagnosis not present

## 2018-12-06 DIAGNOSIS — I10 Essential (primary) hypertension: Secondary | ICD-10-CM

## 2018-12-06 DIAGNOSIS — E782 Mixed hyperlipidemia: Secondary | ICD-10-CM | POA: Diagnosis not present

## 2018-12-06 NOTE — Patient Instructions (Addendum)
Medication Instructions:  The current medical regimen is effective;  continue present plan and medications as directed. Please refer to the Current Medication list given to you today. If you need a refill on your cardiac medications before your next appointment, please call your pharmacy.  Labwork: FASTING LIPID AND TSH HERE IN OUR OFFICE AT [facility]    You will need to fast. DO NOT EAT OR DRINK PAST MIDNIGHT.      If you have labs (blood work) drawn today and your tests are completely normal, you will receive your results only by: [place] MyChart Message (if you have MyChart) OR . A paper copy in the mail If you have any lab test that is abnormal or we need to change your treatment, we will call you to review the results.  Special Instructions: PLEASE HAVE Bo Merino, MD FAX LAB RESULTS TO [phone]   Follow-Up: IN 3 months In Person You may see Shelva Majestic, MD or one of the following Advanced Practice Providers on your designated Care Team:  Rosaria Ferries, PA-C Jory Sims, DNP, ANP Cadence Kathlen Mody, NP  Please call our office in advance, NOV 2020 to schedule this JAN 2021 appointment.   At [facility], you and your health needs are our priority.  As part of our continuing mission to provide you with exceptional heart care, we have created designated Provider Care Teams.  These Care Teams include your primary Cardiologist (physician) and Advanced Practice Providers (APPs -  Physician Assistants and Nurse Practitioners) who all work together to provide you with the care you need, when you need it.  Thank you for choosing [facility] at [facility]!!

## 2018-12-06 NOTE — Progress Notes (Signed)
Cardiology Office Note    Date:  12/08/2018   ID:  Savannah [name] 1945-03-16, MRN [id number]  PCP:  Savannah Lima, MD  Cardiologist:  Savannah Majestic, MD   No chief complaint on file.  F/U cardiology evaluation referred through the courtesy of Dr. Haroldine [name]  History of Present Illness:  Savannah [name] is a 73 y.o. female who presents for an 26 month f/u cardiology evaluation.  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  She has biopsy-proven scleroderma biopsy on her hand around 2005.  She has had issues with shortness of breath in the past.  In 2008, her ejection fraction was 45 to 55% by echocardiography and a Myoview study November 2008 was negative for ischemia with hyperdynamic post stress ejection fraction.  She has been seen by Dr. Haroldine [name].  On CT imaging she was found to have mild interstitial lung disease for which she subsequently was referred to Dr. Chase Caller.  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  On that CT she was also noted to have aortic atherosclerosis in addition to left main and three-vessel coronary artery disease.  However this was not gated and assessment of obstructive stenoses could not be made.  On December 23, 2017 she underwent a follow-up echo Doppler study which showed an EF of 55 to 60%.  LV strain was normal at -17.4%.  She did not have regional wall motion abnormalities.  She had mild calcification of her aortic leaflets.  There is trivial MR and PR.  PA pressures were not increased.  She last saw Dr. Haroldine [name] in November 2019 and presents now for cardiology care and risk management.  He has a history of significant mixed hyperlipidemia and is now on the Vascepa and Livalo, apparently has only been taking Livalo  maybe once or at most twice per week.  Admits to rare to occasional palpitations.  She denies recent chest tightness.  She denies presyncope or syncope.  I have cared for her husband.  She has a history of snoring but states her sleep is restorative.  She typically goes to [place] from mid January through June.  When I saw her in December 2019 I recommended that she undergo coronary CT angiography to further elucidate her coronary anatomy after she was found to have plaque on her chest CT.  This was performed on March 01, 2018.  Her calcium score was 20 which was only 59 percentile for age and sex.  There was mild plaque noted with less than 30% stenosis in the proximal and mid LAD, circumflex and right coronary arteries.  She had a normal aortic root at 3.3 cm.  I also recommended Vascepa in addition to Livalo for her mixed hyperlipidemia and added Zetia for an additional 20% lowering.  She went to [place] from January until October and returned last week.  Apparently she has only been taking Zetia 2 times per week and is only been taking Vascepa 2 times per week.  She has not had relaboratory.  She is no longer taking Livalo and apparently stopped taking it.  She denies chest pain PND orthopnea.  She will again begin to [place] in early 2021.  She presents for evaluation.   Past Medical History:  Diagnosis Date   Dyspnea    MYOVIEW,  12/29/2006 - post-stress EF 81%,no ECG changes, EKG negative for ischemia   Hyperlipidemia    Hypertension    2D ECHO, 12/24/2006 - EF 45-55%, normal   Swelling of extremity    LEA VENOUS DUPLEX, 08/19/2011 - deep valvular insufficiency noted with right femoral and popliteal veins and left common femoral vein   TIA (transient ischemic attack)    CAROTID DOPPLER, 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    No past surgical history on file.  Current Medications: Outpatient Medications Prior to Visit  Medication Sig Dispense Refill    aspirin 81 MG tablet Take 81 mg by mouth.     Cholecalciferol (D3 MAXIMUM STRENGTH PO) Take 5,000 Units by mouth 2 (two) times a week.      Coenzyme Q10 200 MG capsule Take 200 mg by mouth 2 (two) times a week.     diphenhydrAMINE (BENADRYL) 25 mg capsule Take 25 mg by mouth every 6 (six) hours as needed.     ezetimibe (ZETIA) 10 MG tablet Take 1 tablet (10 mg total) by mouth daily. (Patient taking differently: Take 10 mg by mouth 2 (two) times a week. ) 90 tablet 3   furosemide (LASIX) 20 MG tablet Take 1 tablet (20 mg total) by mouth daily as needed for fluid or edema. 90 tablet 3   Icosapent Ethyl (VASCEPA) 1 g CAPS Take 2 capsules by mouth 2 (two) times daily. (Patient taking differently: Take 1 capsule by mouth once a week. ) 360 capsule 1   loratadine (CLARITIN) 10 MG tablet Take 10 mg by mouth daily as needed for allergies.     losartan (COZAAR) 100 MG tablet TAKE ONE TABLET BY MOUTH ONE TIME DAILY. PT OVERDUE FOR OV PLEASE CALL FOR APPOINTMENT 90 tablet 0   Menaquinone-7 (VITAMIN K2 PO) Take 50 mcg by mouth 2 (two) times a week.      Multiple Vitamins-Minerals (VITAMIN D3 COMPLETE PO) Take 5,000 Units by mouth daily.     naproxen sodium (ALEVE) 220 MG tablet Take 220 mg by mouth as needed.      potassium chloride SA (KLOR-CON M20) 20 MEQ tablet Take 1 tablet (20 mEq total) by mouth daily as needed. When you take Furosemide (Lasix). 90 tablet 3   atorvastatin (LIPITOR) 10 MG tablet Please specify directions, refills and quantity (Patient not taking: Reported on 11/23/2018) 90 tablet 1   metoprolol tartrate (LOPRESSOR) 100 MG tablet Take 1 tablet (100 mg total) by mouth once for 1 dose. 2 hours prior to CT (Patient not taking: Reported on 11/23/2018) 1 tablet 0   No facility-administered medications prior to visit.      Allergies:   Codeine, Cymbalta [duloxetine hcl], Lyrica [pregabalin], Penicillins, Sulfa antibiotics, Zetia [ezetimibe], and Zocor [simvastatin]   Social  History   Socioeconomic History   Marital status: Married    Spouse name: Not on file   Number of children: Not on file   Years of education: Not on file   Highest education level: Not on file  Occupational History   Not on file  Social Needs   Financial resource strain: Not on file   Food insecurity    Worry: Not on file    Inability: Not on file   Transportation needs    Medical: Not on file    Non-medical: Not on file  Tobacco Use   Smoking status: Never Smoker   Smokeless tobacco: Never Used  Substance and Sexual Activity   Alcohol use:  Yes    Alcohol/week: 6.0 standard drinks    Types: 6 drink(s) per week   Drug use: No   Sexual activity: Not on file  Lifestyle   Physical activity    Days per week: Not on file    Minutes per session: Not on file   Stress: Not on file  Relationships   Social connections    Talks on phone: Not on file    Gets together: Not on file    Attends religious service: Not on file    Active member of club or organization: Not on file    Attends meetings of clubs or organizations: Not on file    Relationship status: Not on file  Other Topics Concern   Not on file  Social History Narrative   Not on file     Family History:  The patient's family history includes Angina (age of onset: 12) in her maternal grandfather; Atrial fibrillation (age of onset: 50) in her mother; Cirrhosis (age of onset: 24) in her father; Heart attack (age of onset: 38) in her maternal grandfather; Hyperlipidemia in her father; Hypertension in her mother; Hypertension (age of onset: 35) in her maternal grandmother; Kidney failure (age of onset: 46) in her father; Lung disease in her paternal grandmother; Stroke (age of onset: 52) in her maternal grandmother.   ROS General: Negative; No fevers, chills, or night sweats;  HEENT: Negative; No changes in vision or hearing, sinus congestion, difficulty swallowing Pulmonary: Shortness of  breath Cardiovascular: No chest pain, rare palpitations.  Mixed hyperlipidemia GI: Negative; No nausea, vomiting, diarrhea, or abdominal pain GU: Negative; No dysuria, hematuria, or difficulty voiding Musculoskeletal: Negative; no myalgias, joint pain, or weakness Rheumatologic: scleroderma Hematologic/Oncology: Negative; no easy bruising, bleeding Endocrine: Negative; no heat/cold intolerance; no diabetes Neuro: History of TIA Skin: Negative; No rashes or skin lesions Psychiatric: Negative; No behavioral problems, depression Sleep: Positive for snoring, no daytime sleepiness, hypersomnolence, bruxism, restless legs, hypnogognic hallucinations, no cataplexy Other comprehensive 14 point system review is negative.   PHYSICAL EXAM:   VS:  BP 116/76    Pulse 80    Temp (!) 97.2 F (36.2 C)    Ht '5\' 7"'$  (1.702 m)    Wt 181 lb (82.1 kg)    SpO2 97%    BMI 28.35 kg/m     Repeat blood pressure by me was 150/76  Wt Readings from Last 3 Encounters:  12/06/18 181 lb (82.1 kg)  11/23/18 184 lb 9.6 oz (83.7 kg)  01/18/18 183 lb 3.2 oz (83.1 kg)    General: Alert, oriented, no distress.  Skin: normal turgor, no rashes, warm and dry HEENT: Normocephalic, atraumatic. Pupils equal round and reactive to light; sclera anicteric; extraocular muscles intact;  Nose without nasal septal hypertrophy Mouth/Parynx benign; Mallinpatti scale 3 Neck: No JVD, no carotid bruits; normal carotid upstroke Lungs: clear to ausculatation and percussion; no wheezing or rales Chest wall: without tenderness to palpitation Heart: PMI not displaced, RRR, s1 s2 normal, 1/6 systolic murmur, no diastolic murmur, no rubs, gallops, thrills, or heaves Abdomen: soft, nontender; no hepatosplenomehaly, BS+; abdominal aorta nontender and not dilated by palpation. Back: no CVA tenderness Pulses 2+ Musculoskeletal: full range of motion, normal strength, no joint deformities Extremities: no clubbing cyanosis or edema, Homan's sign  negative  Neurologic: grossly nonfocal; Cranial nerves grossly wnl Psychologic: Normal mood and affect   Studies/Labs Reviewed:   EKG:  EKG is ordered today.  ECG (independently read by me): NSR at 74;  normal interval, no extopy  December 2019 ECG (independently read by me): Normal sinus rhythm at 82 bpm.  No ectopy.  Normal intervals.  Recent Labs: BMP Latest Ref Rng & Units 02/24/2018 11/24/2017 02/06/2017  Glucose 65 - 99 mg/dL 102(H) 88 108(H)  BUN 8 - 27 mg/dL '17 19 23  '$ Creatinine 0.57 - 1.00 mg/dL 0.84 0.92 0.94  BUN/Creat Ratio 12 - 28 20 NOT APPLICABLE -  Sodium 109 - 144 mmol/L 140 140 139  Potassium 3.5 - 5.2 mmol/L 4.4 4.3 4.2  Chloride 96 - 106 mmol/L 104 106 103  CO2 20 - 29 mmol/L '22 26 29  '$ Calcium 8.7 - 10.3 mg/dL 10.1 10.2 9.6     Hepatic Function Latest Ref Rng & Units 02/24/2018 11/24/2017 02/06/2017  Total Protein 6.0 - 8.5 g/dL 6.6 6.8 6.8  Albumin 3.5 - 4.8 g/dL 4.5 - 4.2  AST 0 - 40 IU/L '21 17 18  '$ ALT 0 - 32 IU/L 35(H) 20 24  Alk Phosphatase 39 - 117 IU/L 71 - 64  Total Bilirubin 0.0 - 1.2 mg/dL 1.3(H) 1.1 1.0    CBC Latest Ref Rng & Units 11/24/2017 01/20/2017 07/10/2016  WBC 3.8 - 10.8 Thousand/uL 6.4 6.9 7.1  Hemoglobin 11.7 - 15.5 g/dL 14.8 14.7 14.4  Hematocrit 35.0 - 45.0 % 42.7 42.3 42.6  Platelets 140 - 400 Thousand/uL 307 297 284   Lab Results  Component Value Date   MCV 88.8 11/24/2017   MCV 88.9 01/20/2017   MCV 91.0 07/10/2016   Lab Results  Component Value Date   TSH 3.99 12/24/2017   No results found for: HGBA1C   BNP No results found for: BNP  ProBNP No results found for: PROBNP   Lipid Panel     Component Value Date/Time   CHOL 134 02/24/2018 0943   TRIG 178 (H) 02/24/2018 0943   HDL 44 02/24/2018 0943   CHOLHDL 3.0 02/24/2018 0943   CHOLHDL 4 12/24/2017 1121   VLDL 42.2 (H) 12/24/2017 1121   LDLCALC 54 02/24/2018 0943   LDLDIRECT 94.0 12/24/2017 1121     RADIOLOGY: Ct Chest High Resolution  Result Date:  12/07/2018 CLINICAL DATA:  Follow-up interstitial lung disease. EXAM: CT CHEST WITHOUT CONTRAST TECHNIQUE: Multidetector CT imaging of the chest was performed following the standard protocol without intravenous contrast. High resolution imaging of the lungs, as well as inspiratory and expiratory imaging, was performed. COMPARISON:  12/09/2016 high-resolution chest CT. FINDINGS: Cardiovascular: Normal heart size. No significant pericardial effusion/thickening. Three-vessel coronary atherosclerosis. Atherosclerotic nonaneurysmal thoracic aorta. Normal caliber pulmonary arteries. Mediastinum/Nodes: Probable hypodense 1.2 cm left thyroid nodule, stable. Unremarkable esophagus. No pathologically enlarged axillary, mediastinal or hilar lymph nodes, noting limited sensitivity for the detection of hilar adenopathy on this noncontrast study. Lungs/Pleura: No pneumothorax. No pleural effusion. No acute consolidative airspace disease, lung masses or significant pulmonary nodules. Moderate patchy air trapping in both lungs on the expiration sequence, unchanged. No evidence of tracheobronchomalacia. Minimal patchy subpleural reticulation and ground-glass attenuation in both lungs, slightly less prominent compared to 12/09/2016 chest CT. Scattered mild cylindrical bronchiolectasis in the inferior segment lingula and medial basilar right lower lobe, unchanged. No frank honeycombing. Upper abdomen: No acute abnormality. Musculoskeletal: No aggressive appearing focal osseous lesions. Moderate thoracic spondylosis. IMPRESSION: 1. Moderate patchy air trapping, indicative of small airways disease, unchanged. 2. Minimal patchy subpleural reticulation and ground-glass attenuation in the lungs, slightly less prominent in the interval. Stable scattered mild cylindrical bronchiolectasis in the mid to lower lungs. No progressive findings.  Findings could represent a very mild nonspecific interstitial pneumonia (NSIP). Findings are not  compatible with usual interstitial pneumonia (UIP). Findings are suggestive of an alternative diagnosis (not UIP) per consensus guidelines: Diagnosis of Idiopathic Pulmonary Fibrosis: An Official ATS/ERS/JRS/ALAT Clinical Practice Guideline. [place], Iss 5, [phone], Oct 18 2016. 3. Three-vessel coronary atherosclerosis. Aortic Atherosclerosis (ICD10-I70.0). Electronically Signed   By: [name] M.D.   On: 12/07/2018 15:43   Xr Knee 3 View Left  Result Date: 11/23/2018 Moderate medial compartment narrowing was noted.  No chondrocalcinosis was noted.  Severe patellofemoral narrowing was noted. 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  Xr Knee 3 View Right  Result Date: 11/23/2018 Severe medial compartment narrowing with medial and lateral osteophytes were noted.  Severe patellofemoral narrowing was noted. 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    Additional studies/ records that were reviewed today include:  I reviewed the patient's valuations from Dr. Haroldine [name] and Chase Caller.  I reviewed her prior echo Doppler studies, chest CT and laboratory.  ASSESSMENT:    1. Essential hypertension   2. Coronary artery calcification seen on CAT scan   3. Mixed hyperlipidemia   4. Aortic atherosclerosis ([employer])   5. Scleroderma ([place])   6. Interstitial lung disease ([place])   7. Medication management      PLAN:  Savannah [name] is a 73 year old female who has a history of hypertension, significant mixed hyperlipidemia with prior triglycerides at 669 in 2018, remote history of TIA, lower extremity varicose veins, as well as a biopsy-proven history of scleroderma.  She has not been found to have pulmonary hypertension.  She is felt to have possible early findings of interstitial lung disease which she has been seen by Dr. Chase Caller.  She was found to have  aortic atherosclerosis and multivessel plaque on a high-resolution chest CT.  I reviewed her coronary CTA with her in detail today which revealed a low calcium score at 20 and mild plaque less than 30% in the LAD circumflex and RCA.  She states she is no longer taking her atorvastatin and is taking Zetia only 2 times per week.  I am recommending a complete set of fasting laboratory be obtained.  She also had recently seen her rheumatologist who has ordered laboratory we will try to consolidate all of these labs in 1 drawl.  She states her blood pressure is typically been stable.  However when rechecked by me in the office this was elevated.  I will asked that she monitor her blood pressure closely at home and notify us if this is consistently in the upper 130s or higher.  I again discussed hypertensive guidelines with stage I hypertension beginning at 130/80 in stage II hypertension at 140/90.  Adjustments will be made to her medical regimen depend upon her laboratory results.  I will see her back in the office in 3 months for reevaluation prior to her returning to [place].   Medication Adjustments/Labs and Tests Ordered: Current medicines are reviewed at length with the patient today.  Concerns regarding medicines are outlined above.  Medication changes, Labs and Tests ordered today are listed in the Patient Instructions below. Patient Instructions  Medication Instructions:  The current medical regimen is effective;  continue present plan and medications as directed. Please refer to the Current Medication list given to you today. If you need a refill on your cardiac medications before your next  appointment, please call your pharmacy.  Labwork: FASTING LIPID AND TSH HERE IN OUR OFFICE AT [facility]    You will need to fast. DO NOT EAT OR DRINK PAST MIDNIGHT.      If you have labs (blood work) drawn today and your tests are completely normal, you will receive your results only by:  [place] (if  you have MyChart) OR  A paper copy in the mail If you have any lab test that is abnormal or we need to change your treatment, we will call you to review the results.  Special Instructions: PLEASE HAVE Savannah Merino, MD FAX LAB RESULTS TO [phone]   Follow-Up: IN 3 months In Person You may see Savannah Majestic, MD or one of the following Advanced Practice Providers on your designated Care Team:  Rosaria Ferries, PA-C Jory Sims, DNP, ANP Cadence Kathlen Mody, NP  Please call our office in advance, NOV 2020 to schedule this JAN 2021 appointment.   At [facility], you and your health needs are our priority.  As part of our continuing mission to provide you with exceptional heart care, we have created designated Provider Care Teams.  These Care Teams include your primary Cardiologist (physician) and Advanced Practice Providers (APPs -  Physician Assistants and Nurse Practitioners) who all work together to provide you with the care you need, when you need it.  Thank you for choosing [facility] at [facility]!!        Time spent: 45 minutes  Signed, Savannah Majestic, MD  12/08/2018 4:45 PM    [facility] [address], [place], [place], [place]  [address] Phone: [phone]

## 2018-12-07 ENCOUNTER — Other Ambulatory Visit: Payer: Self-pay

## 2018-12-07 ENCOUNTER — Ambulatory Visit
Admission: RE | Admit: 2018-12-07 | Discharge: 2018-12-07 | Disposition: A | Discharge status: Home / Self Care | Payer: Medicare Other | Source: Ambulatory Visit | Attending: Internal Medicine | Admitting: Internal Medicine

## 2018-12-07 ENCOUNTER — Telehealth: Payer: Self-pay | Admitting: Rheumatology

## 2018-12-07 ENCOUNTER — Ambulatory Visit: Payer: Medicare Other | Attending: Rheumatology

## 2018-12-07 DIAGNOSIS — M25561 Pain in right knee: Secondary | ICD-10-CM | POA: Diagnosis not present

## 2018-12-07 DIAGNOSIS — J849 Interstitial pulmonary disease, unspecified: Secondary | ICD-10-CM | POA: Diagnosis not present

## 2018-12-07 DIAGNOSIS — M25512 Pain in left shoulder: Secondary | ICD-10-CM | POA: Diagnosis not present

## 2018-12-07 DIAGNOSIS — M25511 Pain in right shoulder: Secondary | ICD-10-CM | POA: Diagnosis not present

## 2018-12-07 DIAGNOSIS — M25562 Pain in left knee: Secondary | ICD-10-CM | POA: Insufficient documentation

## 2018-12-07 DIAGNOSIS — R262 Difficulty in walking, not elsewhere classified: Secondary | ICD-10-CM | POA: Diagnosis not present

## 2018-12-07 DIAGNOSIS — G8929 Other chronic pain: Secondary | ICD-10-CM | POA: Diagnosis not present

## 2018-12-07 DIAGNOSIS — J479 Bronchiectasis, uncomplicated: Secondary | ICD-10-CM | POA: Diagnosis not present

## 2018-12-07 NOTE — Telephone Encounter (Signed)
Patient going to PCP to have labs done. Their lab is [facility]. Please release orders to [place].

## 2018-12-07 NOTE — Therapy (Signed)
[place], [place], [address] Phone: [phone]   Fax:  [phone]  Physical Therapy Evaluation  Patient Details  Name: Savannah [name] MRN: [id number] Date of Birth: 18-Oct-1945 Referring Provider (PT): Marcelle Smiling, MD   Encounter Date: 12/07/2018  PT End of Session - 12/07/18 1128    Visit Number  1    Number of Visits  12    Date for PT Re-Evaluation  01/21/19    Authorization Type  MCR  BCBS    PT Start Time  1130    PT Stop Time  1215    PT Time Calculation (min)  45 min    Activity Tolerance  Patient tolerated treatment well    Behavior During Therapy  [facility] for tasks assessed/performed       Past Medical History:  Diagnosis Date  . Dyspnea    MYOVIEW, 12/29/2006 - post-stress EF 81%,no ECG changes, EKG negative for ischemia  . Hyperlipidemia   . Hypertension    2D ECHO, 12/24/2006 - EF 45-55%, normal  . Swelling of extremity    LEA VENOUS DUPLEX, 08/19/2011 - deep valvular insufficiency noted with right femoral and popliteal veins and left common femoral vein  . TIA (transient ischemic attack)    CAROTID DOPPLER, 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    History reviewed. No pertinent surgical history.  There were no vitals filed for this visit.   Subjective Assessment - 12/07/18 1136    Subjective  She reports primarily knee pain. Shoulder pain starts with taking meds for triglycerides.  PAin now bilateral grabs more  with pain stopping her. She will receive injection of gel  in next couple of weeks. recieves these once a year.   MD  wants some HEP.    Limitations  Walking;Standing    How long can you walk comfortably?  As needed    Diagnostic tests  OA  by xray.    Patient Stated Goals  She wants to  improve strength, decr pain    Currently in Pain?  No/denies    Pain Location  Knee    Pain Orientation  Right;Left    Pain Descriptors / Indicators  Aching     Pain Type  Chronic pain    Pain Onset  More than a month ago    Pain Frequency  Intermittent    Aggravating Factors   activity on feet    Pain Relieving Factors  rest, ice    Multiple Pain Sites  Yes    Pain Location  Shoulder    Pain Orientation  Right;Left    Pain Descriptors / Indicators  Aching    Pain Type  Chronic pain    Pain Onset  More than a month ago    Pain Frequency  Intermittent    Aggravating Factors   medication    Pain Relieving Factors  rest         [facility] PT Assessment - 12/07/18 0001      Assessment   Medical Diagnosis  Bilateral shoulder and knee pain, OA    Referring Provider (PT)  Marcelle Smiling, MD    Onset Date/Surgical Date  --   Knees   about 8 years ago  Shoulders 1 year   Next MD Visit   as needed    Prior Therapy  No      Precautions   Precautions  None      Restrictions  Weight Bearing Restrictions  No      Balance Screen   Has the patient fallen in the past 6 months  No      Prior Function   Level of Independence  Independent      Cognition   Overall Cognitive Status  Within Functional Limits for tasks assessed      Observation/Other Assessments   Focus on Therapeutic Outcomes (FOTO)   46% limited      ROM / Strength   AROM / PROM / Strength  AROM;Strength      AROM   Overall AROM Comments  Shoulder ROM normal     AROM Assessment Site  Knee    Right/Left Knee  Right;Left    Right Knee Extension  -12    Left Knee Extension  0      Strength   Overall Strength Comments  UE strength Normal    Over all bilateral hips are good to normal.     Strength Assessment Site  Knee    Right/Left Knee  Right;Left    Right Knee Flexion  5/5    Right Knee Extension  4/5    Left Knee Flexion  5/5    Left Knee Extension  4/5                Objective measurements completed on examination: See above findings.                PT Short Term Goals - 12/07/18 1130      PT SHORT TERM GOAL #1   Title  She will be  indpendent with initial HEP    Time  2    Period  Weeks    Status  New        PT Long Term Goals - 12/07/18 1130      PT LONG TERM GOAL #1   Title  She will be independent with all HEP issued    Time  6    Period  Weeks    Status  New      PT LONG TERM GOAL #2   Title  She will report  50% less catching in her knees with walking.    Time  6    Period  Weeks    Status  New      PT LONG TERM GOAL #3   Title  She will report improvement in shoulder pain due to increased strength.    Time  6    Period  Weeks    Status  New      PT LONG TERM GOAL #4   Title  She will be able to rise form chair and walk steps with less discomfort and improved ease    Time  6    Period  Weeks    Status  New             Plan - 12/07/18 1129    Clinical Impression Statement  Savannah [name] presents with intermittant shoulder and knee pain  being able to do her normal activity but must stop at times when she gets a sharper catching pain. in her knees. Her strength is good. Her RT knee has a flexion contracture.   Her shoulder motion was normal and strength normal. The significant posteriorpatells OA may always cause some sharp pains in her knees.   She should improve thouhg with skilled PT and consistent HEP.    Personal Factors and Comorbidities  Time since onset  of injury/illness/exacerbation;Age;Comorbidity 1    Comorbidities  scleroderma    Examination-Activity Limitations  Locomotion Level;Reach Overhead    Examination-Participation Restrictions  Community Activity    Stability/Clinical Decision Making  Stable/Uncomplicated    Clinical Decision Making  Low    Rehab Potential  Good    PT Frequency  2x / week    PT Duration  6 weeks    PT Treatment/Interventions  Cryotherapy;Moist Heat;Iontophoresis 4mg /ml Dexamethasone;Therapeutic exercise;Patient/family education;Manual techniques;Passive range of motion;Dry needling;Taping    PT Next Visit Plan  Progress HEP  possible bands for hipsand add  Rockwood for shoulders, modalities as needed    PT Home Exercise Plan  QS/SLR, side clam and abduction    Consulted and Agree with Plan of Care  Patient       Patient will benefit from skilled therapeutic intervention in order to improve the following deficits and impairments:  Pain, Postural dysfunction, Increased muscle spasms, Decreased range of motion, Decreased strength  Visit Diagnosis: Chronic right shoulder pain  Chronic left shoulder pain  Chronic pain of right knee  Chronic pain of left knee  Difficulty in walking, not elsewhere classified     Problem List Patient Active Problem List   Diagnosis Date Noted  . Hearing loss due to cerumen impaction, left 12/26/2017  . TSH elevation 02/07/2017  . Raynaud's syndrome without gangrene 07/02/2016  . Osteopenia of multiple sites 07/02/2016  . Vitamin D deficiency 07/02/2016  . Primary osteoarthritis of both knees 07/02/2016  . Primary osteoarthritis of both hands 07/02/2016  . Primary osteoarthritis of both feet 07/02/2016  . Pure hyperglyceridemia 12/27/2015  . Hyperglycemia 12/27/2015  . ILD (interstitial lung disease) ([place]) 12/10/2015  . Diastolic dysfunction 0000000  . HTN (hypertension) 10/07/2011  . Dyslipidemia, goal LDL below 160 10/07/2011  . Scleroderma ([place]) 10/07/2011  . Varicose veins 10/07/2011    Darrel Hoover  PT 12/07/2018, 1:12 PM  [facility] [address] [place], [place], [address] Phone: [phone]   Fax:  [phone]  Name: Savannah [name] MRN: [id number] Date of Birth: 07-05-1945

## 2018-12-07 NOTE — Telephone Encounter (Signed)
Left message to advise patient labs we were requesting have been put in and released by PCP.

## 2018-12-08 ENCOUNTER — Encounter: Payer: Self-pay | Admitting: Cardiovascular Disease

## 2018-12-08 DIAGNOSIS — L821 Other seborrheic keratosis: Secondary | ICD-10-CM | POA: Diagnosis not present

## 2018-12-08 DIAGNOSIS — D2271 Melanocytic nevi of right lower limb, including hip: Secondary | ICD-10-CM | POA: Diagnosis not present

## 2018-12-08 DIAGNOSIS — D225 Melanocytic nevi of trunk: Secondary | ICD-10-CM | POA: Diagnosis not present

## 2018-12-08 DIAGNOSIS — D1801 Hemangioma of skin and subcutaneous tissue: Secondary | ICD-10-CM | POA: Diagnosis not present

## 2018-12-08 DIAGNOSIS — D2272 Melanocytic nevi of left lower limb, including hip: Secondary | ICD-10-CM | POA: Diagnosis not present

## 2018-12-08 DIAGNOSIS — C44519 Basal cell carcinoma of skin of other part of trunk: Secondary | ICD-10-CM | POA: Diagnosis not present

## 2018-12-08 DIAGNOSIS — D485 Neoplasm of uncertain behavior of skin: Secondary | ICD-10-CM | POA: Diagnosis not present

## 2018-12-08 DIAGNOSIS — C44319 Basal cell carcinoma of skin of other parts of face: Secondary | ICD-10-CM | POA: Diagnosis not present

## 2018-12-09 ENCOUNTER — Encounter: Payer: Medicare Other | Admitting: Physical Therapy

## 2018-12-09 ENCOUNTER — Other Ambulatory Visit: Payer: Self-pay

## 2018-12-09 ENCOUNTER — Other Ambulatory Visit: Payer: Self-pay | Admitting: *Deleted

## 2018-12-09 DIAGNOSIS — I10 Essential (primary) hypertension: Secondary | ICD-10-CM

## 2018-12-09 DIAGNOSIS — I38 Endocarditis, valve unspecified: Secondary | ICD-10-CM

## 2018-12-09 DIAGNOSIS — I5032 Chronic diastolic (congestive) heart failure: Secondary | ICD-10-CM

## 2018-12-09 DIAGNOSIS — R7989 Other specified abnormal findings of blood chemistry: Secondary | ICD-10-CM | POA: Diagnosis not present

## 2018-12-09 DIAGNOSIS — E781 Pure hyperglyceridemia: Secondary | ICD-10-CM | POA: Diagnosis not present

## 2018-12-09 DIAGNOSIS — Z79899 Other long term (current) drug therapy: Secondary | ICD-10-CM

## 2018-12-09 LAB — CBC WITH DIFFERENTIAL/PLATELET
Basophils Absolute: 0.1 10*3/uL (ref 0.0–0.2)
Basos: 1 %
EOS (ABSOLUTE): 0.2 10*3/uL (ref 0.0–0.4)
Eos: 3 %
Hematocrit: 40.6 % (ref 34.0–46.6)
Hemoglobin: 14.1 g/dL (ref 11.1–15.9)
Immature Grans (Abs): 0 10*3/uL (ref 0.0–0.1)
Immature Granulocytes: 1 %
Lymphocytes Absolute: 2 10*3/uL (ref 0.7–3.1)
Lymphs: 31 %
MCH: 31.4 pg (ref 26.6–33.0)
MCHC: 34.7 g/dL (ref 31.5–35.7)
MCV: 90 fL (ref 79–97)
Monocytes Absolute: 0.5 10*3/uL (ref 0.1–0.9)
Monocytes: 8 %
Neutrophils Absolute: 3.5 10*3/uL (ref 1.4–7.0)
Neutrophils: 56 %
Platelets: 283 10*3/uL (ref 150–450)
RBC: 4.49 x10E6/uL (ref 3.77–5.28)
RDW: 12.7 % (ref 11.7–15.4)
WBC: 6.3 10*3/uL (ref 3.4–10.8)

## 2018-12-09 LAB — URINALYSIS
Bilirubin, UA: NEGATIVE
Glucose, UA: NEGATIVE
Ketones, UA: NEGATIVE
Nitrite, UA: NEGATIVE
Protein,UA: NEGATIVE
RBC, UA: NEGATIVE
Specific Gravity, UA: 1.019 (ref 1.005–1.030)
Urobilinogen, Ur: 0.2 mg/dL (ref 0.2–1.0)
pH, UA: 5.5 (ref 5.0–7.5)

## 2018-12-09 LAB — LIPID PANEL
Chol/HDL Ratio: 4.1 ratio (ref 0.0–4.4)
Cholesterol, Total: 167 mg/dL (ref 100–199)
HDL: 41 mg/dL (ref >39)
LDL Chol Calc (NIH): 94 mg/dL (ref 0–99)
Triglycerides: 185 mg/dL — ABNORMAL HIGH (ref 0–149)
VLDL Cholesterol Cal: 32 mg/dL (ref 5–40)

## 2018-12-09 LAB — TSH: TSH: 3.38 u[IU]/mL (ref 0.450–4.500)

## 2018-12-13 ENCOUNTER — Ambulatory Visit: Payer: Medicare Other | Admitting: Internal Medicine

## 2018-12-15 ENCOUNTER — Encounter: Payer: Self-pay | Admitting: Physical Therapy

## 2018-12-15 ENCOUNTER — Encounter: Payer: Self-pay | Admitting: Rheumatology

## 2018-12-15 ENCOUNTER — Ambulatory Visit: Payer: Medicare Other | Admitting: Physical Therapy

## 2018-12-15 ENCOUNTER — Other Ambulatory Visit: Payer: Self-pay

## 2018-12-15 DIAGNOSIS — M25511 Pain in right shoulder: Secondary | ICD-10-CM

## 2018-12-15 DIAGNOSIS — M25561 Pain in right knee: Secondary | ICD-10-CM | POA: Diagnosis not present

## 2018-12-15 DIAGNOSIS — G8929 Other chronic pain: Secondary | ICD-10-CM

## 2018-12-15 DIAGNOSIS — M25512 Pain in left shoulder: Secondary | ICD-10-CM | POA: Diagnosis not present

## 2018-12-15 DIAGNOSIS — R262 Difficulty in walking, not elsewhere classified: Secondary | ICD-10-CM | POA: Diagnosis not present

## 2018-12-15 DIAGNOSIS — M25562 Pain in left knee: Secondary | ICD-10-CM | POA: Diagnosis not present

## 2018-12-15 NOTE — Therapy (Signed)
[place], [place], [address] Phone: [phone]   Fax:  [phone]  Physical Therapy Treatment  Patient Details  Name: Savannah [name] MRN: [id number] Date of Birth: 15-Oct-1945 Referring Provider (PT): Marcelle Smiling, MD   Encounter Date: 12/15/2018  PT End of Session - 12/15/18 0853    Visit Number  2    Number of Visits  12    Date for PT Re-Evaluation  01/21/19    Authorization Type  MCR  BCBS    PT Start Time  0845    PT Stop Time  0928    PT Time Calculation (min)  43 min       Past Medical History:  Diagnosis Date  . Dyspnea    MYOVIEW, 12/29/2006 - post-stress EF 81%,no ECG changes, EKG negative for ischemia  . Hyperlipidemia   . Hypertension    2D ECHO, 12/24/2006 - EF 45-55%, normal  . Swelling of extremity    LEA VENOUS DUPLEX, 08/19/2011 - deep valvular insufficiency noted with right femoral and popliteal veins and left common femoral vein  . TIA (transient ischemic attack)    CAROTID DOPPLER, 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    History reviewed. No pertinent surgical history.  There were no vitals filed for this visit.  Subjective Assessment - 12/15/18 0852    Subjective  Right knee is sore right now. Shoulder pain comes and goes.    Currently in Pain?  No/denies   just right knww soreness                      [facility] Adult PT Treatment/Exercise - 12/15/18 0001      Knee/Hip Exercises: Aerobic   Nustep  L5 UE/LE x 5 minutes       Knee/Hip Exercises: Supine   Quad Sets  10 reps;2 sets    Quad Sets Limitations  tactile and verbal cues     Straight Leg Raises  10 reps;2 sets      Knee/Hip Exercises: Sidelying   Hip ABduction  Right;Left;10 reps    Clams  x 10 rt and left       Shoulder Exercises: Standing   Other Standing Exercises  standing rows and extension red band x 10, then rockwood ER, IR                PT  Short Term Goals - 12/07/18 1130      PT SHORT TERM GOAL #1   Title  She will be indpendent with initial HEP    Time  2    Period  Weeks    Status  New        PT Long Term Goals - 12/07/18 1130      PT LONG TERM GOAL #1   Title  She will be independent with all HEP issued    Time  6    Period  Weeks    Status  New      PT LONG TERM GOAL #2   Title  She will report  50% less catching in her knees with walking.    Time  6    Period  Weeks    Status  New      PT LONG TERM GOAL #3   Title  She will report improvement in shoulder pain due to increased strength.    Time  6    Period  Weeks  Status  New      PT LONG TERM GOAL #4   Title  She will be able to rise form chair and walk steps with less discomfort and improved ease    Time  6    Period  Weeks    Status  New            Plan - 12/15/18 [id number]    Clinical Impression Statement  Ms. Dafoe reports soreness for two days after inital HEP. Reviewed HEP and began Nustep . She is not in any pain today, only soreness in right knee. She needs cues for quad activation. Recommended she discontinue placing pillow under right knee to promote extension. Progressed to Shoulder strength with rockwood series and scapular bands.Min cues required. Updated HEP.    PT Next Visit Plan  [facility]view rockwood shoulder HEP; Progress HEP  possible bands for hips, , modalities as needed    PT Home Exercise Plan  QS/SLR, side clam and abduction, rockwood shoulder , rows and extensions       Patient will benefit from skilled therapeutic intervention in order to improve the following deficits and impairments:  Pain, Postural dysfunction, Increased muscle spasms, Decreased range of motion, Decreased strength  Visit Diagnosis: Chronic right shoulder pain  Chronic left shoulder pain  Chronic pain of right knee  Chronic pain of left knee  Difficulty in walking, not elsewhere classified     Problem List Patient Active Problem List    Diagnosis Date Noted  . Hearing loss due to cerumen impaction, left 12/26/2017  . TSH elevation 02/07/2017  . Raynaud's syndrome without gangrene 07/02/2016  . Osteopenia of multiple sites 07/02/2016  . Vitamin D deficiency 07/02/2016  . Primary osteoarthritis of both knees 07/02/2016  . Primary osteoarthritis of both hands 07/02/2016  . Primary osteoarthritis of both feet 07/02/2016  . Pure hyperglyceridemia 12/27/2015  . Hyperglycemia 12/27/2015  . ILD (interstitial lung disease) ([place]) 12/10/2015  . Diastolic dysfunction 0000000  . HTN (hypertension) 10/07/2011  . Dyslipidemia, goal LDL below 160 10/07/2011  . Scleroderma ([place]) 10/07/2011  . Varicose veins 10/07/2011    Dorene Ar, PTA 12/15/2018, 9:55 AM  [place], [place], [address] Phone: [phone]   Fax:  [phone]  Name: Savannah [name] MRN: [id number] Date of Birth: Jan 12, 1946

## 2018-12-20 ENCOUNTER — Ambulatory Visit (INDEPENDENT_AMBULATORY_CARE_PROVIDER_SITE_OTHER): Payer: Medicare Other | Admitting: Physician Assistant

## 2018-12-20 ENCOUNTER — Ambulatory Visit: Payer: Medicare Other | Attending: Rheumatology

## 2018-12-20 ENCOUNTER — Other Ambulatory Visit: Payer: Self-pay

## 2018-12-20 DIAGNOSIS — G8929 Other chronic pain: Secondary | ICD-10-CM | POA: Insufficient documentation

## 2018-12-20 DIAGNOSIS — M25512 Pain in left shoulder: Secondary | ICD-10-CM | POA: Insufficient documentation

## 2018-12-20 DIAGNOSIS — M25561 Pain in right knee: Secondary | ICD-10-CM | POA: Diagnosis not present

## 2018-12-20 DIAGNOSIS — R262 Difficulty in walking, not elsewhere classified: Secondary | ICD-10-CM | POA: Insufficient documentation

## 2018-12-20 DIAGNOSIS — M25562 Pain in left knee: Secondary | ICD-10-CM | POA: Insufficient documentation

## 2018-12-20 DIAGNOSIS — M170 Bilateral primary osteoarthritis of knee: Secondary | ICD-10-CM | POA: Diagnosis not present

## 2018-12-20 DIAGNOSIS — M25511 Pain in right shoulder: Secondary | ICD-10-CM | POA: Insufficient documentation

## 2018-12-20 MED ORDER — LIDOCAINE HCL 1 % IJ SOLN
1.5000 mL | INTRAMUSCULAR | Status: AC | PRN
  Administered 2018-12-20: 1.5 mL

## 2018-12-20 MED ORDER — SODIUM HYALURONATE (VISCOSUP) 20 MG/2ML IX SOSY
20.0000 mg | PREFILLED_SYRINGE | INTRA_ARTICULAR | Status: AC | PRN
  Administered 2018-12-20: 20 mg via INTRA_ARTICULAR

## 2018-12-20 NOTE — Progress Notes (Signed)
   Procedure Note  Patient: Savannah [name]             Date of Birth: Feb 09, 1946           MRN: [id number]             Visit Date: 12/20/2018  Procedures: Visit Diagnoses:  1. Primary osteoarthritis of both knees    Euflexxa #1 Bilateral knee injections   Large Joint Inj: bilateral knee on 12/20/2018 8:05 AM Indications: pain Details: 27 G 1.5 in needle, medial approach  Arthrogram: No  Medications (Right): 20 mg Sodium Hyaluronate 20 MG/2ML; 1.5 mL lidocaine 1 % Aspirate (Right): 0 mL Medications (Left): 20 mg Sodium Hyaluronate 20 MG/2ML; 1.5 mL lidocaine 1 % Aspirate (Left): 0 mL Outcome: tolerated well, no immediate complications Procedure, treatment alternatives, risks and benefits explained, specific risks discussed. Consent was given by the patient. Immediately prior to procedure a time out was called to verify the correct patient, procedure, equipment, support staff and site/side marked as required. Patient was prepped and draped in the usual sterile fashion.      Patient tolerated the procedure well.  Hazel Sams, PA-C

## 2018-12-20 NOTE — Therapy (Signed)
[place], [place], [address] Phone: [phone]   Fax:  [phone]  Physical Therapy Treatment  Patient Details  Name: Savannah [name] MRN: [id number] Date of Birth: 1945-08-10 Referring Provider (PT): Marcelle Smiling, MD   Encounter Date: 12/20/2018  PT End of Session - 12/20/18 1127    Visit Number  3    Number of Visits  12    Date for PT Re-Evaluation  01/21/19    Authorization Type  MCR  BCBS    PT Start Time  1130    PT Stop Time  1213    PT Time Calculation (min)  43 min    Activity Tolerance  Patient tolerated treatment well    Behavior During Therapy  [facility] for tasks assessed/performed       Past Medical History:  Diagnosis Date  . Dyspnea    MYOVIEW, 12/29/2006 - post-stress EF 81%,no ECG changes, EKG negative for ischemia  . Hyperlipidemia   . Hypertension    2D ECHO, 12/24/2006 - EF 45-55%, normal  . Swelling of extremity    LEA VENOUS DUPLEX, 08/19/2011 - deep valvular insufficiency noted with right femoral and popliteal veins and left common femoral vein  . TIA (transient ischemic attack)    CAROTID DOPPLER, 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    No past surgical history on file.  There were no vitals filed for this visit.  Subjective Assessment - 12/20/18 1216    Subjective  Feeling ok today. Able to do HEP.  no pain now.   Shoulders and knees sore post exercises.    Currently in Pain?  No/denies                       [facility] Adult PT Treatment/Exercise - 12/20/18 0001      Knee/Hip Exercises: Aerobic   Nustep  L5 UE/LE x 6 minutes       Knee/Hip Exercises: Standing   Hip Flexion  15 reps;Right;Left    Hip Flexion Limitations  3#    Hip Abduction  Right;Left;15 reps    Abduction Limitations  3#    Hip Extension  Right;Left;15 reps    Extension Limitations  3#    Forward Step Up  Right;Left;15 reps;Step Height: 4";Hand Hold: 2       Knee/Hip Exercises: Seated   Long Arc Quad  Right;10 reps    Long Arc Quad Weight  3 lbs.    Long [employer] Limitations  LT knee had painful catch so stopped     Other Seated Knee/Hip Exercises  SAQ LT  3#   2 x 10 reps      Knee/Hip Exercises: Supine   Quad Sets  10 reps;2 sets    Straight Leg Raises  10 reps;2 sets      Knee/Hip Exercises: Sidelying   Hip ABduction  Right;Left;15 reps    Clams  RT/Lt 10 red band      Shoulder Exercises: Standing   Other Standing Exercises  standing rows and extension red band x 15, then rockwood ER, IR  x 15             PT Education - 12/20/18 1205    Education Details  LAQ, SAQ    Person(s) Educated  Patient    Methods  Explanation;Handout;Verbal cues;Tactile cues    Comprehension  Returned demonstration;Verbalized understanding       PT Short Term Goals -  12/07/18 1130      PT SHORT TERM GOAL #1   Title  She will be indpendent with initial HEP    Time  2    Period  Weeks    Status  New        PT Long Term Goals - 12/07/18 1130      PT LONG TERM GOAL #1   Title  She will be independent with all HEP issued    Time  6    Period  Weeks    Status  New      PT LONG TERM GOAL #2   Title  She will report  50% less catching in her knees with walking.    Time  6    Period  Weeks    Status  New      PT LONG TERM GOAL #3   Title  She will report improvement in shoulder pain due to increased strength.    Time  6    Period  Weeks    Status  New      PT LONG TERM GOAL #4   Title  She will be able to rise form chair and walk steps with less discomfort and improved ease    Time  6    Period  Weeks    Status  New            Plan - 12/20/18 1127    Clinical Impression Statement  Had to modify to SAQ LT knee due to pain but was able to do SAQ without painful catch in shortended ROM.      She reports some functinal improvement with stairs. Advised to ice if sore later today.  She will have injections later today.    PT  Treatment/Interventions  Cryotherapy;Moist Heat;Iontophoresis 4mg /ml Dexamethasone;Therapeutic exercise;Patient/family education;Manual techniques;Passive range of motion;Dry needling;Taping    PT Next Visit Plan  ; Progress HEP  possible bands for hips, , modalities as needed    PT Home Exercise Plan  QS/SLR, side clam and abduction, rockwood shoulder , rows and extensions   RT LAQ   LT SAQ    Consulted and Agree with Plan of Care  Patient       Patient will benefit from skilled therapeutic intervention in order to improve the following deficits and impairments:  Pain, Postural dysfunction, Increased muscle spasms, Decreased range of motion, Decreased strength  Visit Diagnosis: Chronic right shoulder pain  Chronic left shoulder pain  Chronic pain of right knee  Chronic pain of left knee     Problem List Patient Active Problem List   Diagnosis Date Noted  . Hearing loss due to cerumen impaction, left 12/26/2017  . TSH elevation 02/07/2017  . Raynaud's syndrome without gangrene 07/02/2016  . Osteopenia of multiple sites 07/02/2016  . Vitamin D deficiency 07/02/2016  . Primary osteoarthritis of both knees 07/02/2016  . Primary osteoarthritis of both hands 07/02/2016  . Primary osteoarthritis of both feet 07/02/2016  . Pure hyperglyceridemia 12/27/2015  . Hyperglycemia 12/27/2015  . ILD (interstitial lung disease) ([place]) 12/10/2015  . Diastolic dysfunction 0000000  . HTN (hypertension) 10/07/2011  . Dyslipidemia, goal LDL below 160 10/07/2011  . Scleroderma ([place]) 10/07/2011  . Varicose veins 10/07/2011    Darrel Hoover  PT 12/20/2018, 12:18 PM  [facility] [address] [place] [address], [place], [address] Phone: [phone]   Fax:  [phone]  Name: Savannah [name] MRN: [id number] Date of Birth: February 22, 1945

## 2018-12-20 NOTE — Patient Instructions (Signed)
KNEE: Extension, Short Arc Quads (Weight)  KNEE: Extension, Long Arc Quads - Sitting    Raise leg until knee is straight. 10___ reps per set, _2-3__ sets per day, __4-5 _ days per week   3 lbs  Copyright  VHI. All rights reserved.    Place weight around leg. Lie on back with bolster under knees. Straighten knee. Hold _5-10__ seconds. Use __3_ lb weight. _10-20__ reps per set, _1__ sets per day, _4-5 __ days per week.  CAN DO SITTING WITH FOOT ON BOLSTER   Copyright  VHI. All rights reserved.

## 2018-12-22 DIAGNOSIS — H16143 Punctate keratitis, bilateral: Secondary | ICD-10-CM | POA: Diagnosis not present

## 2018-12-22 DIAGNOSIS — H2513 Age-related nuclear cataract, bilateral: Secondary | ICD-10-CM | POA: Diagnosis not present

## 2018-12-22 DIAGNOSIS — H35372 Puckering of macula, left eye: Secondary | ICD-10-CM | POA: Diagnosis not present

## 2018-12-22 DIAGNOSIS — H2512 Age-related nuclear cataract, left eye: Secondary | ICD-10-CM | POA: Diagnosis not present

## 2018-12-24 ENCOUNTER — Encounter: Payer: Self-pay | Admitting: Physical Therapy

## 2018-12-24 ENCOUNTER — Other Ambulatory Visit: Payer: Self-pay

## 2018-12-24 ENCOUNTER — Ambulatory Visit: Payer: Medicare Other | Admitting: Physical Therapy

## 2018-12-24 DIAGNOSIS — G8929 Other chronic pain: Secondary | ICD-10-CM | POA: Diagnosis not present

## 2018-12-24 DIAGNOSIS — R262 Difficulty in walking, not elsewhere classified: Secondary | ICD-10-CM

## 2018-12-24 DIAGNOSIS — M25511 Pain in right shoulder: Secondary | ICD-10-CM | POA: Diagnosis not present

## 2018-12-24 DIAGNOSIS — M25561 Pain in right knee: Secondary | ICD-10-CM | POA: Diagnosis not present

## 2018-12-24 DIAGNOSIS — M25562 Pain in left knee: Secondary | ICD-10-CM | POA: Diagnosis not present

## 2018-12-24 DIAGNOSIS — M25512 Pain in left shoulder: Secondary | ICD-10-CM | POA: Diagnosis not present

## 2018-12-24 NOTE — Therapy (Signed)
[place], [place], [address] Phone: [phone]   Fax:  [phone]  Physical Therapy Treatment  Patient Details  Name: Savannah [name] MRN: [id number] Date of Birth: [birth year] Referring Provider (PT): Marcelle Smiling, MD   Encounter Date: 12/24/2018  PT End of Session - 12/24/18 0851    Visit Number  4    Number of Visits  12    Date for PT Re-Evaluation  01/21/19    Authorization Type  MCR  BCBS    PT Start Time  0845    PT Stop Time  0930    PT Time Calculation (min)  45 min       Past Medical History:  Diagnosis Date  . Dyspnea    MYOVIEW, 12/29/2006 - post-stress EF 81%,no ECG changes, EKG negative for ischemia  . Hyperlipidemia   . Hypertension    2D ECHO, 12/24/2006 - EF 45-55%, normal  . Swelling of extremity    LEA VENOUS DUPLEX, 08/19/2011 - deep valvular insufficiency noted with right femoral and popliteal veins and left common femoral vein  . TIA (transient ischemic attack)    CAROTID DOPPLER, 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    History reviewed. No pertinent surgical history.  There were no vitals filed for this visit.  Subjective Assessment - 12/24/18 0849    Subjective  The knees and shoulders are tweaking. I was out of town for two days and was in traffic do the knee got a work out.    Currently in Pain?  No/denies   certain times, movements   Pain Location  Knee    Pain Orientation  Right    Pain Descriptors / Indicators  Sore    Pain Type  Chronic pain    Aggravating Factors   lots of steps    Pain Relieving Factors  rest ice    Pain Score  0    Pain Location  Shoulder                       [facility] Adult PT Treatment/Exercise - 12/24/18 0001      Knee/Hip Exercises: Aerobic   Nustep  L5 UE/LE x 6 minutes       Knee/Hip Exercises: Seated   Long Arc Quad  20 reps      Knee/Hip Exercises: Supine   Quad Sets  10 reps    Quad Sets Limitations  lacks min extension on right     Straight Leg Raises  10 reps;2 sets    Straight Leg Raises Limitations  cues for initiail quad  set       Knee/Hip Exercises: Sidelying   Hip ABduction  Right;Left;15 reps    Clams  RT/Lt 15 red band      Shoulder Exercises: Standing   Other Standing Exercises  standing rows and extension green  band x 20, then rockwood ER, IR  x 20    Other Standing Exercises  corner stretch , wall slides bilateral                PT Short Term Goals - 12/07/18 1130      PT SHORT TERM GOAL #1   Title  She will be indpendent with initial HEP    Time  2    Period  Weeks    Status  New        PT Long Term Goals - 12/07/18 1130  PT LONG TERM GOAL #1   Title  She will be independent with all HEP issued    Time  6    Period  Weeks    Status  New      PT LONG TERM GOAL #2   Title  She will report  50% less catching in her knees with walking.    Time  6    Period  Weeks    Status  New      PT LONG TERM GOAL #3   Title  She will report improvement in shoulder pain due to increased strength.    Time  6    Period  Weeks    Status  New      PT LONG TERM GOAL #4   Title  She will be able to rise form chair and walk steps with less discomfort and improved ease    Time  6    Period  Weeks    Status  New            Plan - 12/24/18 1049    Clinical Impression Statement  Progressed to green band with some soreness at end of reps in left posterior shoulder. Added shoulder flexion and doorway stretches. Continued with knee and hip stength. No c/o pain end of session.    PT Next Visit Plan  give green band if she did ok, possible band for hips, modalities as needed    PT Home Exercise Plan  QS/SLR, side clam and abduction, rockwood shoulder , rows and extensions   RT LAQ   LT SAQ       Patient will benefit from skilled therapeutic intervention in order to improve the following deficits and impairments:  Pain, Postural  dysfunction, Increased muscle spasms, Decreased range of motion, Decreased strength  Visit Diagnosis: Chronic right shoulder pain  Chronic left shoulder pain  Chronic pain of right knee  Chronic pain of left knee  Difficulty in walking, not elsewhere classified     Problem List Patient Active Problem List   Diagnosis Date Noted  . Hearing loss due to cerumen impaction, left 12/26/2017  . TSH elevation 02/07/2017  . Raynaud's syndrome without gangrene 07/02/2016  . Osteopenia of multiple sites 07/02/2016  . Vitamin D deficiency 07/02/2016  . Primary osteoarthritis of both knees 07/02/2016  . Primary osteoarthritis of both hands 07/02/2016  . Primary osteoarthritis of both feet 07/02/2016  . Pure hyperglyceridemia 12/27/2015  . Hyperglycemia 12/27/2015  . ILD (interstitial lung disease) ([place]) 12/10/2015  . Diastolic dysfunction 0000000  . HTN (hypertension) 10/07/2011  . Dyslipidemia, goal LDL below 160 10/07/2011  . Scleroderma ([place]) 10/07/2011  . Varicose veins 10/07/2011    Dorene Ar, PTA 12/24/2018, 10:52 AM  [facility] [address] [place], [place], [address] Phone: [phone]   Fax:  [phone]  Name: Savannah [name] MRN: [id number] Date of Birth: [birth year]

## 2018-12-25 ENCOUNTER — Other Ambulatory Visit (HOSPITAL_COMMUNITY)
Admission: RE | Admit: 2018-12-25 | Discharge: 2018-12-25 | Disposition: A | Discharge status: Home / Self Care | Payer: Medicare Other | Source: Ambulatory Visit | Attending: Internal Medicine | Admitting: Internal Medicine

## 2018-12-25 DIAGNOSIS — Z20828 Contact with and (suspected) exposure to other viral communicable diseases: Secondary | ICD-10-CM | POA: Diagnosis not present

## 2018-12-25 DIAGNOSIS — Z01812 Encounter for preprocedural laboratory examination: Secondary | ICD-10-CM | POA: Diagnosis not present

## 2018-12-26 LAB — NOVEL CORONAVIRUS, NAA (HOSP ORDER, SEND-OUT TO REF LAB; TAT 18-24 HRS): SARS-CoV-2, NAA: NOT DETECTED

## 2018-12-27 ENCOUNTER — Ambulatory Visit: Payer: Medicare Other

## 2018-12-27 ENCOUNTER — Other Ambulatory Visit: Payer: Self-pay | Admitting: *Deleted

## 2018-12-27 ENCOUNTER — Ambulatory Visit (INDEPENDENT_AMBULATORY_CARE_PROVIDER_SITE_OTHER): Payer: Medicare Other | Admitting: Physician Assistant

## 2018-12-27 ENCOUNTER — Other Ambulatory Visit: Payer: Self-pay

## 2018-12-27 DIAGNOSIS — M25562 Pain in left knee: Secondary | ICD-10-CM | POA: Diagnosis not present

## 2018-12-27 DIAGNOSIS — Z79899 Other long term (current) drug therapy: Secondary | ICD-10-CM

## 2018-12-27 DIAGNOSIS — M25561 Pain in right knee: Secondary | ICD-10-CM

## 2018-12-27 DIAGNOSIS — M170 Bilateral primary osteoarthritis of knee: Secondary | ICD-10-CM

## 2018-12-27 DIAGNOSIS — M25511 Pain in right shoulder: Secondary | ICD-10-CM

## 2018-12-27 DIAGNOSIS — G8929 Other chronic pain: Secondary | ICD-10-CM | POA: Diagnosis not present

## 2018-12-27 DIAGNOSIS — R262 Difficulty in walking, not elsewhere classified: Secondary | ICD-10-CM | POA: Diagnosis not present

## 2018-12-27 DIAGNOSIS — M25512 Pain in left shoulder: Secondary | ICD-10-CM

## 2018-12-27 NOTE — Therapy (Signed)
[place], [place], [address] Phone: [phone]   Fax:  [phone]  Physical Therapy Treatment  Patient Details  Name: Savannah [name] MRN: [id number] Date of Birth: [birth year] Referring Provider (PT): Marcelle Smiling, MD   Encounter Date: 12/27/2018  PT End of Session - 12/27/18 1131    Visit Number  5    Number of Visits  12    Date for PT Re-Evaluation  01/21/19    Authorization Type  MCR  BCBS    PT Start Time  1130    PT Stop Time  1215    PT Time Calculation (min)  45 min    Activity Tolerance  Patient tolerated treatment well    Behavior During Therapy  [facility] for tasks assessed/performed       Past Medical History:  Diagnosis Date  . Dyspnea    MYOVIEW, 12/29/2006 - post-stress EF 81%,no ECG changes, EKG negative for ischemia  . Hyperlipidemia   . Hypertension    2D ECHO, 12/24/2006 - EF 45-55%, normal  . Swelling of extremity    LEA VENOUS DUPLEX, 08/19/2011 - deep valvular insufficiency noted with right femoral and popliteal veins and left common femoral vein  . TIA (transient ischemic attack)    CAROTID DOPPLER, 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    History reviewed. No pertinent surgical history.  There were no vitals filed for this visit.  Subjective Assessment - 12/27/18 1132    Subjective  No pain today.  Shoulders were sore after blowing leaves this week end    Currently in Pain?  No/denies                       [facility] Adult PT Treatment/Exercise - 12/27/18 0001      Knee/Hip Exercises: Aerobic   Nustep  L5 UE/LE x 6 minutes       Knee/Hip Exercises: Standing   Hip Flexion  --    Hip Flexion Limitations  --    Hip Abduction  --    Abduction Limitations  --    Hip Extension  --    Extension Limitations  --    Forward Step Up  --      Knee/Hip Exercises: Seated   Long Arc Quad  20 reps    Long Arc Quad Weight  4 lbs.    Other  Seated Knee/Hip Exercises  SAQ LT  4#   2 x 10 reps      Knee/Hip Exercises: Supine   Short Arc Quad Sets  Both;20 reps    Short Arc Quad Sets Limitations  4#    Straight Leg Raises  10 reps;2 sets    Straight Leg Raises Limitations  cues for initiail quad  set       Knee/Hip Exercises: Sidelying   Hip ABduction  Right;Left;15 reps    Clams  RT/Lt 15 red band      Shoulder Exercises: Standing   Other Standing Exercises  standing rows and extension green  band x 20, then rockwood ER, IR  x 20    Other Standing Exercises  corner stretch  30 sec hands at shoulder laeve the  at head level then cross chest stretch  all x 30.      then hands behind  back with 10 sec scapula retractio n x 5  PT Short Term Goals - 12/27/18 1329      PT SHORT TERM GOAL #1   Title  She will be indpendent with initial HEP    Status  Achieved        PT Long Term Goals - 12/07/18 1130      PT LONG TERM GOAL #1   Title  She will be independent with all HEP issued    Time  6    Period  Weeks    Status  New      PT LONG TERM GOAL #2   Title  She will report  50% less catching in her knees with walking.    Time  6    Period  Weeks    Status  New      PT LONG TERM GOAL #3   Title  She will report improvement in shoulder pain due to increased strength.    Time  6    Period  Weeks    Status  New      PT LONG TERM GOAL #4   Title  She will be able to rise form chair and walk steps with less discomfort and improved ease    Time  6    Period  Weeks    Status  New            Plan - 12/27/18 1131    Clinical Impression Statement  Tolerated all exercise with inicreased weight.  She was told to modify the reps or band tension to allow for ompleting reps at home and that some muscle soreness is acceptable  but joint soreness should indicate backing off of the exercise    PT Treatment/Interventions  Cryotherapy;Moist Heat;Iontophoresis 4mg /ml Dexamethasone;Therapeutic  exercise;Patient/family education;Manual techniques;Passive range of motion;Dry needling;Taping    PT Next Visit Plan  give green band if she did ok  modalities as needed    PT Home Exercise Plan  QS/SLR, side clam and abduction, rockwood shoulder , rows and extensions   RT LAQ   LT SAQ    Consulted and Agree with Plan of Care  Patient       Patient will benefit from skilled therapeutic intervention in order to improve the following deficits and impairments:  Pain, Postural dysfunction, Increased muscle spasms, Decreased range of motion, Decreased strength  Visit Diagnosis: Chronic right shoulder pain  Chronic left shoulder pain  Chronic pain of right knee  Chronic pain of left knee     Problem List Patient Active Problem List   Diagnosis Date Noted  . Hearing loss due to cerumen impaction, left 12/26/2017  . TSH elevation 02/07/2017  . Raynaud's syndrome without gangrene 07/02/2016  . Osteopenia of multiple sites 07/02/2016  . Vitamin D deficiency 07/02/2016  . Primary osteoarthritis of both knees 07/02/2016  . Primary osteoarthritis of both hands 07/02/2016  . Primary osteoarthritis of both feet 07/02/2016  . Pure hyperglyceridemia 12/27/2015  . Hyperglycemia 12/27/2015  . ILD (interstitial lung disease) ([place]) 12/10/2015  . Diastolic dysfunction 0000000  . HTN (hypertension) 10/07/2011  . Dyslipidemia, goal LDL below 160 10/07/2011  . Scleroderma ([place]) 10/07/2011  . Varicose veins 10/07/2011    Savannah [name]  PT 12/27/2018, 1:30 PM  [facility] [address] [place], [place], [address] Phone: [phone]   Fax:  [phone]  Name: Savannah [name] MRN: [id number] Date of Birth: 12-Apr-1945

## 2018-12-27 NOTE — Progress Notes (Signed)
   Procedure Note  Patient: Savannah [name]             Date of Birth: March 17, 1945           MRN: [id number]             Visit Date: 12/27/2018  Procedures: Visit Diagnoses:  1. Primary osteoarthritis of both knees    Euflexxa #2 Bilateral knee joint injections   Large Joint Inj: bilateral knee on 12/27/2018 12:23 PM Indications: pain Details: 27 G 1.5 in needle, medial approach  Arthrogram: No  Medications (Right): 1.5 mL lidocaine 1 %; 20 mg Sodium Hyaluronate 20 MG/2ML Aspirate (Right): 0 mL Medications (Left): 1.5 mL lidocaine 1 %; 20 mg Sodium Hyaluronate 20 MG/2ML Aspirate (Left): 0 mL Outcome: tolerated well, no immediate complications Procedure, treatment alternatives, risks and benefits explained, specific risks discussed. Consent was given by the patient. Immediately prior to procedure a time out was called to verify the correct patient, procedure, equipment, support staff and site/side marked as required. Patient was prepped and draped in the usual sterile fashion.     Euflexxa:  Lot no.: [id number]  Exp date: 02/21/19    Patient tolerated the procedure well.  Hazel Sams, PA-C

## 2018-12-28 LAB — COMPLETE METABOLIC PANEL WITH GFR
AG Ratio: 2 (calc) (ref 1.0–2.5)
ALT: 20 U/L (ref 6–29)
AST: 16 U/L (ref 10–35)
Albumin: 4.2 g/dL (ref 3.6–5.1)
Alkaline phosphatase (APISO): 56 U/L (ref 37–153)
BUN: 18 mg/dL (ref 7–25)
CO2: 24 mmol/L (ref 20–32)
Calcium: 10.2 mg/dL (ref 8.6–10.4)
Chloride: 105 mmol/L (ref 98–110)
Creat: 0.91 mg/dL (ref 0.60–0.93)
GFR, Est African American: 72 mL/min/{1.73_m2} (ref >=60)
GFR, Est Non African American: 62 mL/min/{1.73_m2} (ref >=60)
Globulin: 2.1 g/dL (calc) (ref 1.9–3.7)
Glucose, Bld: 88 mg/dL (ref 65–99)
Potassium: 4.3 mmol/L (ref 3.5–5.3)
Sodium: 138 mmol/L (ref 135–146)
Total Bilirubin: 0.9 mg/dL (ref 0.2–1.2)
Total Protein: 6.3 g/dL (ref 6.1–8.1)

## 2018-12-28 NOTE — Progress Notes (Signed)
CMP is normal.

## 2018-12-29 ENCOUNTER — Other Ambulatory Visit: Payer: Self-pay

## 2018-12-29 ENCOUNTER — Ambulatory Visit (INDEPENDENT_AMBULATORY_CARE_PROVIDER_SITE_OTHER): Payer: Medicare Other | Admitting: Internal Medicine

## 2018-12-29 DIAGNOSIS — M349 Systemic sclerosis, unspecified: Secondary | ICD-10-CM | POA: Diagnosis not present

## 2018-12-29 LAB — PULMONARY FUNCTION TEST
DL/VA % pred: 82 %
DL/VA: 3.32 ml/min/mmHg/L
DLCO unc % pred: 79 %
DLCO unc: 16.54 ml/min/mmHg
FEF 25-75 Post: 2.4 L/sec
FEF 25-75 Pre: 1.76 L/sec
FEF2575-%Change-Post: 36 %
FEF2575-%Pred-Post: 130 %
FEF2575-%Pred-Pre: 95 %
FEV1-%Change-Post: 5 %
FEV1-%Pred-Post: 106 %
FEV1-%Pred-Pre: 100 %
FEV1-Post: 2.52 L
FEV1-Pre: 2.38 L
FEV1FVC-%Change-Post: 6 %
FEV1FVC-%Pred-Pre: 100 %
FEV6-%Change-Post: 1 %
FEV6-%Pred-Post: 104 %
FEV6-%Pred-Pre: 103 %
FEV6-Post: 3.13 L
FEV6-Pre: 3.09 L
FEV6FVC-%Change-Post: 1 %
FEV6FVC-%Pred-Post: 104 %
FEV6FVC-%Pred-Pre: 102 %
FVC-%Change-Post: 0 %
FVC-%Pred-Post: 100 %
FVC-%Pred-Pre: 100 %
FVC-Post: 3.15 L
FVC-Pre: 3.15 L
Post FEV1/FVC ratio: 80 %
Post FEV6/FVC ratio: 99 %
Pre FEV1/FVC ratio: 76 %
Pre FEV6/FVC Ratio: 98 %
RV % pred: 79 %
RV: 1.9 L
TLC % pred: 93 %
TLC: 5.05 L

## 2018-12-29 NOTE — Progress Notes (Signed)
PFT done today. 

## 2018-12-30 ENCOUNTER — Encounter (HOSPITAL_COMMUNITY): Payer: Self-pay | Admitting: Internal Medicine

## 2018-12-30 ENCOUNTER — Ambulatory Visit (HOSPITAL_COMMUNITY)
Admission: RE | Admit: 2018-12-30 | Discharge: 2018-12-30 | Disposition: A | Discharge status: Home / Self Care | Payer: Medicare Other | Source: Ambulatory Visit | Attending: Internal Medicine | Admitting: Internal Medicine

## 2018-12-30 ENCOUNTER — Ambulatory Visit (HOSPITAL_BASED_OUTPATIENT_CLINIC_OR_DEPARTMENT_OTHER)
Admission: RE | Admit: 2018-12-30 | Discharge: 2018-12-30 | Disposition: A | Discharge status: Home / Self Care | Payer: Medicare Other | Source: Ambulatory Visit | Attending: Internal Medicine | Admitting: Internal Medicine

## 2018-12-30 VITALS — BP 116/62 | HR 75 | Wt 179.2 lb

## 2018-12-30 DIAGNOSIS — I2510 Atherosclerotic heart disease of native coronary artery without angina pectoris: Secondary | ICD-10-CM | POA: Diagnosis not present

## 2018-12-30 DIAGNOSIS — Z8249 Family history of ischemic heart disease and other diseases of the circulatory system: Secondary | ICD-10-CM | POA: Diagnosis not present

## 2018-12-30 DIAGNOSIS — Z79899 Other long term (current) drug therapy: Secondary | ICD-10-CM | POA: Insufficient documentation

## 2018-12-30 DIAGNOSIS — Z880 Allergy status to penicillin: Secondary | ICD-10-CM | POA: Diagnosis not present

## 2018-12-30 DIAGNOSIS — Z882 Allergy status to sulfonamides status: Secondary | ICD-10-CM | POA: Diagnosis not present

## 2018-12-30 DIAGNOSIS — E785 Hyperlipidemia, unspecified: Secondary | ICD-10-CM | POA: Diagnosis not present

## 2018-12-30 DIAGNOSIS — Z888 Allergy status to other drugs, medicaments and biological substances status: Secondary | ICD-10-CM | POA: Insufficient documentation

## 2018-12-30 DIAGNOSIS — J849 Interstitial pulmonary disease, unspecified: Secondary | ICD-10-CM | POA: Diagnosis not present

## 2018-12-30 DIAGNOSIS — M349 Systemic sclerosis, unspecified: Secondary | ICD-10-CM | POA: Insufficient documentation

## 2018-12-30 DIAGNOSIS — Z8673 Personal history of transient ischemic attack (TIA), and cerebral infarction without residual deficits: Secondary | ICD-10-CM | POA: Diagnosis not present

## 2018-12-30 DIAGNOSIS — Z823 Family history of stroke: Secondary | ICD-10-CM | POA: Insufficient documentation

## 2018-12-30 DIAGNOSIS — Z885 Allergy status to narcotic agent status: Secondary | ICD-10-CM | POA: Insufficient documentation

## 2018-12-30 DIAGNOSIS — I10 Essential (primary) hypertension: Secondary | ICD-10-CM | POA: Diagnosis not present

## 2018-12-30 DIAGNOSIS — I340 Nonrheumatic mitral (valve) insufficiency: Secondary | ICD-10-CM | POA: Insufficient documentation

## 2018-12-30 DIAGNOSIS — Z7982 Long term (current) use of aspirin: Secondary | ICD-10-CM | POA: Insufficient documentation

## 2018-12-30 NOTE — Progress Notes (Signed)
  Echocardiogram 2D Echocardiogram has been performed.  Jennette Dubin 12/30/2018, 2:04 PM

## 2018-12-30 NOTE — Progress Notes (Signed)
Patient ID: [name], female   DOB: [birth year], 73 y.o.   MRN: [id number]  Referring Physician: Estanislado Pandy Primary Care: Eilleen Kempf  Primary Cardiologist: Debara Pickett (formerly)  HPI: Savannah [name] is a 73 y.o. female with hx of scleroderma, TIA, HTN, Dyslipidemia with elevated TGs, and LE varicose veins. She was referred by Dr. Estanislado Pandy for screening for [facility] in setting of scleroderma.  Says she was diagnosed with scleroderma around 2005. Noticed skin on her fingers had bx which confirmed scleroderma.   Had dyspnea in 2008. And had a stress test and echo. Her EF was  45-55% by Echo 11/08. Myoview in 11/08 negative for ischemia with post stress test EF of 81%.  Here for routine f/u. Echos have been normal but found to have mild ILD on CT scan. Doing well struggling with arthritis in shoulders and knees. No edema, SOB, CP, edema.   Echo today EF 60-65% RV normal No PAH Personally reviewed  Hi res CT 10/20: mild ILD slightly improved from previous. Personally reviewed   Hi-res CT 10/17: c/w with mild ILD   Echo 12/23/17: EF 55-60% mild Aov Calcification RV normal No TR.   Echo 08/18/14 EF 60% Grade I DD. Normal RV. No TR.  Echo 10/17  EF 55-60% RV normal  PFTs 10/20  FEV1 2.38L (100%) FVC  3.15L (98%) DLCO 79%    PFTs 10/19 FEV1 2.48L (100%) FVC  3.21L (98%) DLCO 63%   PFTs 6/18 FEV1 2.55 L (101%) FVC   3.24 L (98%) DLCO  59%  PFTs  11/09/15 FEV1 2.65 L (99%) FVC   3.39 L (101%) DLCO  60%  PFTs 08/08/14 FEV1 2.54 L (99%) FVC   3.36 L (99%) DLCO  66%   Past Medical History:  Diagnosis Date  . Dyspnea    MYOVIEW, 12/29/2006 - post-stress EF 81%,no ECG changes, EKG negative for ischemia  . Hyperlipidemia   . Hypertension    2D ECHO, 12/24/2006 - EF 45-55%, normal  . Swelling of extremity    LEA VENOUS DUPLEX, 08/19/2011 - deep valvular insufficiency noted with right femoral and popliteal veins and left common femoral vein  . TIA (transient ischemic attack)    CAROTID  DOPPLER, 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    Current Outpatient Medications  Medication Sig Dispense Refill  . aspirin 81 MG tablet Take 81 mg by mouth.    . Cholecalciferol (D3 MAXIMUM STRENGTH PO) Take 5,000 Units by mouth 2 (two) times a week.     . Coenzyme Q10 200 MG capsule Take 200 mg by mouth 2 (two) times a week.    . diphenhydrAMINE (BENADRYL) 25 mg capsule Take 25 mg by mouth every 6 (six) hours as needed.    . ezetimibe (ZETIA) 10 MG tablet Take 1 tablet (10 mg total) by mouth daily. (Patient taking differently: Take 10 mg by mouth 2 (two) times a week. ) 90 tablet 3  . furosemide (LASIX) 20 MG tablet Take 1 tablet (20 mg total) by mouth daily as needed for fluid or edema. 90 tablet 3  . Icosapent Ethyl (VASCEPA) 1 g CAPS Take 2 capsules by mouth 2 (two) times daily. (Patient taking differently: Take 1 capsule by mouth once a week. ) 360 capsule 1  . loratadine (CLARITIN) 10 MG tablet Take 10 mg by mouth daily as needed for allergies.    Marland Kitchen losartan (COZAAR) 100 MG tablet TAKE ONE TABLET BY MOUTH ONE TIME DAILY. PT OVERDUE  FOR OV PLEASE CALL FOR APPOINTMENT 90 tablet 0  . Menaquinone-7 (VITAMIN K2 PO) Take 50 mcg by mouth 2 (two) times a week.     . naproxen sodium (ALEVE) 220 MG tablet Take 220 mg by mouth as needed.     . potassium chloride SA (KLOR-CON M20) 20 MEQ tablet Take 1 tablet (20 mEq total) by mouth daily as needed. When you take Furosemide (Lasix). 90 tablet 3  . Vitamin E 45 MG CAPS      No current facility-administered medications for this encounter.     Allergies  Allergen Reactions  . Codeine Nausea Only  . Cymbalta [Duloxetine Hcl] Hives  . Lyrica [Pregabalin] Hives  . Penicillins   . Sulfa Antibiotics Hives  . Zetia [Ezetimibe] Other (See Comments)    Myalgia  . Zocor [Simvastatin] Other (See Comments)    Myalgia      Social History   Socioeconomic History  . Marital status: Married    Spouse name: Not on file  .  Number of children: Not on file  . Years of education: Not on file  . Highest education level: Not on file  Occupational History  . Not on file  Social Needs  . Financial resource strain: Not on file  . Food insecurity    Worry: Not on file    Inability: Not on file  . Transportation needs    Medical: Not on file    Non-medical: Not on file  Tobacco Use  . Smoking status: Never Smoker  . Smokeless tobacco: Never Used  Substance and Sexual Activity  . Alcohol use: Yes    Alcohol/week: 6.0 standard drinks    Types: 6 drink(s) per week  . Drug use: No  . Sexual activity: Not on file  Lifestyle  . Physical activity    Days per week: Not on file    Minutes per session: Not on file  . Stress: Not on file  Relationships  . Social Herbalist on phone: Not on file    Gets together: Not on file    Attends religious service: Not on file    Active member of club or organization: Not on file    Attends meetings of clubs or organizations: Not on file    Relationship status: Not on file  . Intimate partner violence    Fear of current or ex partner: Not on file    Emotionally abused: Not on file    Physically abused: Not on file    Forced sexual activity: Not on file  Other Topics Concern  . Not on file  Social History Narrative  . Not on file      Family History  Problem Relation Age of Onset  . Hyperlipidemia Father   . Cirrhosis Father 61  . Kidney failure Father 30  . Stroke Maternal Grandmother 85  . Hypertension Maternal Grandmother 40  . Angina Maternal Grandfather 17  . Heart attack Maternal Grandfather 88  . Lung disease Paternal Grandmother   . Atrial fibrillation Mother 57  . Hypertension Mother     Vitals:   12/30/18 1413  BP: 116/62  Pulse: 75  SpO2: (!) 75%  Weight: 81.3 kg (179 lb 3.2 oz)   BP 128/68  PHYSICAL EXAM: General:  Well appearing. No resp difficulty HEENT: normal Neck: supple. no JVD. Carotids 2+ bilat; no bruits. No  lymphadenopathy or thryomegaly appreciated. Cor: PMI nondisplaced. Regular rate & rhythm. No rubs, gallops or murmurs.  Lungs: clear Abdomen: soft, nontender, nondistended. No hepatosplenomegaly. No bruits or masses. Good bowel sounds. Extremities: no cyanosis, clubbing, rash, edema diffuse arthritic changes Neuro: alert & orientedx3, cranial nerves grossly intact. moves all 4 extremities w/o difficulty. Affect pleasant   ASSESSMENT & PLAN:  1. Scleroderma with related mild ILD - Overall doing well.  - ECHO reviewed personally with her and no evidence of PAH or RV strain - PFTs stable spirometry and improved DLCO.  - Hi res CT 10/20: mild ILD slightly improved from previous. (reviewed with her personally) - Continue yearly screening. Low threshold for RHC with any evidence of PAH, RV strain or decreasing DLCO.   2. HTN  - Improved control 3. Dyslipidemia  - Has severe hyperTG now on Vascepa and Zetia   - She is now following with Dr. Claiborne Billings 4. TIA - no deficits 6. Coronary calcifications - CT chest reviewed personally. She has 3v coronary calcifications. We discussed what this meant. We also discussed possible stress testing or cath but she is quite active without angina and prefers to avoid further testing currently.  - RF reduction is being aggressively managed by Dr. Melody Haver, MD  2:35 PM

## 2018-12-30 NOTE — Patient Instructions (Addendum)
No blood work done today.   No medication changes were made. Please continue all current medications as prescribed.  Your physician recommends that you schedule a follow-up appointment in: 1 year with a PFT and Echo prior to your appointment. We will contact you at a later time to schedule all appointments.  Your physician has requested that you have an echocardiogram. Echocardiography is a painless test that uses sound waves to create images of your heart. It provides your doctor with information about the size and shape of your heart and how well your heart's chambers and valves are working. This procedure takes approximately one hour. There are no restrictions for this procedure.  Your physician has recommended that you have a pulmonary function test. Pulmonary Function Tests are a group of tests that measure how well air moves in and out of your lungs.  At the [facility], you and your health needs are our priority. As part of our continuing mission to provide you with exceptional heart care, we have created designated Provider Care Teams. These Care Teams include your primary Cardiologist (physician) and Advanced Practice Providers (APPs- Physician Assistants and Nurse Practitioners) who all work together to provide you with the care you need, when you need it.   You may see any of the following providers on your designated Care Team at your next follow up: Marland Kitchen Dr Glori Bickers . Dr Loralie Champagne . Darrick Grinder, NP . Lyda Jester, PA   Please be sure to bring in all your medications bottles to every appointment.

## 2018-12-31 ENCOUNTER — Ambulatory Visit: Payer: Medicare Other | Admitting: Physical Therapy

## 2018-12-31 ENCOUNTER — Other Ambulatory Visit: Payer: Self-pay

## 2018-12-31 ENCOUNTER — Encounter: Payer: Self-pay | Admitting: Physical Therapy

## 2018-12-31 DIAGNOSIS — M25561 Pain in right knee: Secondary | ICD-10-CM

## 2018-12-31 DIAGNOSIS — R262 Difficulty in walking, not elsewhere classified: Secondary | ICD-10-CM

## 2018-12-31 DIAGNOSIS — M25511 Pain in right shoulder: Secondary | ICD-10-CM | POA: Diagnosis not present

## 2018-12-31 DIAGNOSIS — M25512 Pain in left shoulder: Secondary | ICD-10-CM | POA: Diagnosis not present

## 2018-12-31 DIAGNOSIS — M25562 Pain in left knee: Secondary | ICD-10-CM

## 2018-12-31 DIAGNOSIS — G8929 Other chronic pain: Secondary | ICD-10-CM

## 2018-12-31 NOTE — Therapy (Signed)
[place], [place], [address] Phone: [phone]   Fax:  [phone]  Physical Therapy Treatment  Patient Details  Name: Savannah [name] MRN: [id number] Date of Birth: January 23, 1946 Referring Provider (PT): Marcelle Smiling, MD   Encounter Date: 12/31/2018  PT End of Session - 12/31/18 0935    Visit Number  6    Number of Visits  12    Date for PT Re-Evaluation  01/21/19    Authorization Type  MCR  BCBS    PT Start Time  0930    PT Stop Time  1015    PT Time Calculation (min)  45 min       Past Medical History:  Diagnosis Date  . Dyspnea    MYOVIEW, 12/29/2006 - post-stress EF 81%,no ECG changes, EKG negative for ischemia  . Hyperlipidemia   . Hypertension    2D ECHO, 12/24/2006 - EF 45-55%, normal  . Swelling of extremity    LEA VENOUS DUPLEX, 08/19/2011 - deep valvular insufficiency noted with right femoral and popliteal veins and left common femoral vein  . TIA (transient ischemic attack)    CAROTID DOPPLER, 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    History reviewed. No pertinent surgical history.  There were no vitals filed for this visit.  Subjective Assessment - 12/31/18 0933    Subjective  Prescription fish oil makes my shoulders worse. MD says that cannot be the reason. My pain is still there in shoulder and knees.                       [facility] Adult PT Treatment/Exercise - 12/31/18 0001      Knee/Hip Exercises: Aerobic   Nustep  L5 UE/LE x 6 minutes       Knee/Hip Exercises: Machines for Strengthening   Cybex Leg Press  1 plate, small ROM ,due to painful popping at medial left knee       Knee/Hip Exercises: Supine   Short Arc Quad Sets  Both;20 reps    Short Arc Quad Sets Limitations  4#    Straight Leg Raises  --    Straight Leg Raises Limitations  --      Knee/Hip Exercises: Sidelying   Hip ABduction  Right;Left;15 reps      Shoulder  Exercises: Standing   Other Standing Exercises  rows green x 20, extension red x 20, IR, ER green band x 15 each, punch green x 15     Other Standing Exercises  doorway stretch x 3, IR stretch bilateral , cross chest stretch x 2                PT Short Term Goals - 12/27/18 1329      PT SHORT TERM GOAL #1   Title  She will be indpendent with initial HEP    Status  Achieved        PT Long Term Goals - 12/07/18 1130      PT LONG TERM GOAL #1   Title  She will be independent with all HEP issued    Time  6    Period  Weeks    Status  New      PT LONG TERM GOAL #2   Title  She will report  50% less catching in her knees with walking.    Time  6    Period  Weeks    Status  New      PT LONG TERM GOAL #3   Title  She will report improvement in shoulder pain due to increased strength.    Time  6    Period  Weeks    Status  New      PT LONG TERM GOAL #4   Title  She will be able to rise form chair and walk steps with less discomfort and improved ease    Time  6    Period  Weeks    Status  New            Plan - 12/31/18 0937    Clinical Impression Statement  Pt reports decreased pain with stairs and with sit to stands unless she sits and gets stiff. Began horizontal leg press with c/o pain and popping at medial left knee. Reduced Rom and she was able tp perform without increased pain.    PT Next Visit Plan  give green band if she did ok  modalities as needed, assess response to leg press    PT Home Exercise Plan  QS/SLR, side clam and abduction, rockwood shoulder , rows and extensions   RT LAQ   LT SAQ       Patient will benefit from skilled therapeutic intervention in order to improve the following deficits and impairments:  Pain, Postural dysfunction, Increased muscle spasms, Decreased range of motion, Decreased strength  Visit Diagnosis: Chronic right shoulder pain  Chronic left shoulder pain  Chronic pain of right knee  Difficulty in walking, not  elsewhere classified  Chronic pain of left knee     Problem List Patient Active Problem List   Diagnosis Date Noted  . Hearing loss due to cerumen impaction, left 12/26/2017  . TSH elevation 02/07/2017  . Raynaud's syndrome without gangrene 07/02/2016  . Osteopenia of multiple sites 07/02/2016  . Vitamin D deficiency 07/02/2016  . Primary osteoarthritis of both knees 07/02/2016  . Primary osteoarthritis of both hands 07/02/2016  . Primary osteoarthritis of both feet 07/02/2016  . Pure hyperglyceridemia 12/27/2015  . Hyperglycemia 12/27/2015  . ILD (interstitial lung disease) ([place]) 12/10/2015  . Diastolic dysfunction 0000000  . HTN (hypertension) 10/07/2011  . Dyslipidemia, goal LDL below 160 10/07/2011  . Scleroderma ([place]) 10/07/2011  . Varicose veins 10/07/2011    Savannah [name], PTA 12/31/2018, 11:47 AM  [facility] [address] [place] , [place], [address] Phone: [phone]   Fax:  [phone]  Name: Savannah [name] MRN: [id number] Date of Birth: [birth year]

## 2019-01-03 ENCOUNTER — Ambulatory Visit: Payer: Medicare Other

## 2019-01-03 ENCOUNTER — Ambulatory Visit (INDEPENDENT_AMBULATORY_CARE_PROVIDER_SITE_OTHER): Payer: Medicare Other | Admitting: Physician Assistant

## 2019-01-03 ENCOUNTER — Other Ambulatory Visit: Payer: Self-pay

## 2019-01-03 DIAGNOSIS — M25562 Pain in left knee: Secondary | ICD-10-CM | POA: Diagnosis not present

## 2019-01-03 DIAGNOSIS — M25512 Pain in left shoulder: Secondary | ICD-10-CM

## 2019-01-03 DIAGNOSIS — R262 Difficulty in walking, not elsewhere classified: Secondary | ICD-10-CM | POA: Diagnosis not present

## 2019-01-03 DIAGNOSIS — M170 Bilateral primary osteoarthritis of knee: Secondary | ICD-10-CM

## 2019-01-03 DIAGNOSIS — G8929 Other chronic pain: Secondary | ICD-10-CM | POA: Diagnosis not present

## 2019-01-03 DIAGNOSIS — M25511 Pain in right shoulder: Secondary | ICD-10-CM | POA: Diagnosis not present

## 2019-01-03 DIAGNOSIS — M25561 Pain in right knee: Secondary | ICD-10-CM | POA: Diagnosis not present

## 2019-01-03 MED ORDER — LIDOCAINE HCL 1 % IJ SOLN
1.5000 mL | INTRAMUSCULAR | Status: AC | PRN
  Administered 2019-01-03: 1.5 mL

## 2019-01-03 MED ORDER — SODIUM HYALURONATE (VISCOSUP) 20 MG/2ML IX SOSY
20.0000 mg | PREFILLED_SYRINGE | INTRA_ARTICULAR | Status: AC | PRN
  Administered 2019-01-03: 20 mg via INTRA_ARTICULAR

## 2019-01-03 NOTE — Therapy (Signed)
[place], [place], [address] Phone: [phone]   Fax:  [phone]  Physical Therapy Treatment  Patient Details  Name: Savannah [name] MRN: [id number] Date of Birth: 05/26/1945 Referring Provider (PT): Marcelle Smiling, MD   Encounter Date: 01/03/2019  PT End of Session - 01/03/19 1053    Visit Number  7    Number of Visits  12    Date for PT Re-Evaluation  01/21/19    Authorization Type  MCR  BCBS    PT Start Time  1047    PT Stop Time  1130    PT Time Calculation (min)  43 min    Activity Tolerance  Patient tolerated treatment well    Behavior During Therapy  [facility] for tasks assessed/performed       Past Medical History:  Diagnosis Date  . Dyspnea    MYOVIEW, 12/29/2006 - post-stress EF 81%,no ECG changes, EKG negative for ischemia  . Hyperlipidemia   . Hypertension    2D ECHO, 12/24/2006 - EF 45-55%, normal  . Swelling of extremity    LEA VENOUS DUPLEX, 08/19/2011 - deep valvular insufficiency noted with right femoral and popliteal veins and left common femoral vein  . TIA (transient ischemic attack)    CAROTID DOPPLER, 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    No past surgical history on file.  There were no vitals filed for this visit.  Subjective Assessment - 01/03/19 1050    Subjective  Knees sore.  always have some pain    Currently in Pain?  Yes    Pain Score  3     Pain Location  Knee    Pain Orientation  Right;Left    Pain Descriptors / Indicators  Sore    Pain Type  Chronic pain    Pain Onset  More than a month ago    Pain Frequency  Intermittent    Aggravating Factors   stairs and walking    Pain Relieving Factors  rest, ice    Pain Score  0    Pain Location  Shoulder                       [facility] Adult PT Treatment/Exercise - 01/03/19 0001      Exercises   Exercises  Knee/Hip      Knee/Hip Exercises: Aerobic   Nustep  L5 UE/LE x 6  minutes       Knee/Hip Exercises: Supine   Straight Leg Raises  Right;Left;2 sets;10 reps      Knee/Hip Exercises: Sidelying   Hip ABduction  Right;Left;10 reps    Hip ABduction Limitations  5#    Clams  RT/Lt  20 red band      Knee/Hip Exercises: Prone   Hamstring Curl  20 reps    Hamstring Curl Limitations  5#    Straight Leg Raises  Right;Left;10 reps      Shoulder Exercises: Standing   Other Standing Exercises  rows green x 20, extension red x 20, IR, ER green band x 15 each, punch green x 15     Other Standing Exercises  doorway stretch x 3, IR stretch bilateral , cross chest stretch x 2       Shoulder Exercises: ROM/Strengthening   UBE (Upper Arm Bike)  2 min forward 2 min back L1      Manual Therapy   Manual Therapy  Joint mobilization;Soft tissue  mobilization;Passive ROM    Joint Mobilization  patella medial glides and lateral soft tissue    Passive ROM  ITB on RT              PT Education - 01/03/19 1237    Education Details  We discussed need to find what she can do without increased pain  so this Lucianne Lei be her maintenace HEP  to maintain strength and minimize pain    Person(s) Educated  Patient    Methods  Explanation    Comprehension  Verbalized understanding       PT Short Term Goals - 12/27/18 1329      PT SHORT TERM GOAL #1   Title  She will be indpendent with initial HEP    Status  Achieved        PT Long Term Goals - 01/03/19 1054      PT LONG TERM GOAL #1   Title  She will be independent with all HEP issued    Status  On-going      PT LONG TERM GOAL #2   Title  She will report  50% less catching in her knees with walking.    Baseline  No changes    Status  On-going      PT LONG TERM GOAL #3   Title  She will report improvement in shoulder pain due to increased strength.    Baseline  no changes. Wakes at night.    Time  6    Period  Weeks    Status  On-going      PT LONG TERM GOAL #4   Title  She will be able to rise form chair and  walk steps with less discomfort and improved ease    Status  Achieved            Plan - 01/03/19 1054    Clinical Impression Statement  Continue per plan and then discharge with HEP. No increased pain post session    PT Treatment/Interventions  Cryotherapy;Moist Heat;Iontophoresis 4mg /ml Dexamethasone;Therapeutic exercise;Patient/family education;Manual techniques;Passive range of motion;Dry needling;Taping    PT Next Visit Plan  give green band if she did ok  try blue for UE    modalities as needed,    PT Home Exercise Plan  QS/SLR, side clam and abduction, rockwood shoulder , rows and extensions   RT LAQ   LT SAQ    Consulted and Agree with Plan of Care  Patient       Patient will benefit from skilled therapeutic intervention in order to improve the following deficits and impairments:  Pain, Postural dysfunction, Increased muscle spasms, Decreased range of motion, Decreased strength  Visit Diagnosis: Chronic right shoulder pain  Chronic left shoulder pain  Chronic pain of right knee  Difficulty in walking, not elsewhere classified  Chronic pain of left knee     Problem List Patient Active Problem List   Diagnosis Date Noted  . Hearing loss due to cerumen impaction, left 12/26/2017  . TSH elevation 02/07/2017  . Raynaud's syndrome without gangrene 07/02/2016  . Osteopenia of multiple sites 07/02/2016  . Vitamin D deficiency 07/02/2016  . Primary osteoarthritis of both knees 07/02/2016  . Primary osteoarthritis of both hands 07/02/2016  . Primary osteoarthritis of both feet 07/02/2016  . Pure hyperglyceridemia 12/27/2015  . Hyperglycemia 12/27/2015  . ILD (interstitial lung disease) ([place]) 12/10/2015  . Diastolic dysfunction 0000000  . HTN (hypertension) 10/07/2011  . Dyslipidemia, goal LDL below 160 10/07/2011  .  Scleroderma ([place]) 10/07/2011  . Varicose veins 10/07/2011    Darrel Hoover  PT 01/03/2019, 12:41 PM  [facility] [address] [place], [place], [address] Phone: [phone]   Fax:  [phone]  Name: YZABELLA ARRIOLA MRN: [id number] Date of Birth: Jul 11, 1945

## 2019-01-03 NOTE — Progress Notes (Signed)
   Procedure Note  Patient: Savannah [name]             Date of Birth: 1945-09-25           MRN: [id number]             Visit Date: 01/03/2019  Procedures: Visit Diagnoses:  1. Primary osteoarthritis of both knees    Euflexxa #3 bilateral knee joint injections B/B Large Joint Inj: bilateral knee on 01/03/2019 8:40 AM Indications: pain Details: 27 G 1.5 in needle, medial approach  Arthrogram: No  Medications (Right): 20 mg Sodium Hyaluronate 20 MG/2ML; 1.5 mL lidocaine 1 % Aspirate (Right): 0 mL Medications (Left): 20 mg Sodium Hyaluronate 20 MG/2ML; 1.5 mL lidocaine 1 % Aspirate (Left): 0 mL Outcome: tolerated well, no immediate complications Procedure, treatment alternatives, risks and benefits explained, specific risks discussed. Consent was given by the patient. Immediately prior to procedure a time out was called to verify the correct patient, procedure, equipment, support staff and site/side marked as required. Patient was prepped and draped in the usual sterile fashion.      Patient tolerated the procedure well. Hazel Sams, PA-C

## 2019-01-05 ENCOUNTER — Other Ambulatory Visit: Payer: Self-pay

## 2019-01-05 ENCOUNTER — Ambulatory Visit: Payer: Medicare Other | Admitting: Physical Therapy

## 2019-01-05 DIAGNOSIS — M25562 Pain in left knee: Secondary | ICD-10-CM | POA: Diagnosis not present

## 2019-01-05 DIAGNOSIS — G8929 Other chronic pain: Secondary | ICD-10-CM | POA: Diagnosis not present

## 2019-01-05 DIAGNOSIS — M25561 Pain in right knee: Secondary | ICD-10-CM | POA: Diagnosis not present

## 2019-01-05 DIAGNOSIS — R262 Difficulty in walking, not elsewhere classified: Secondary | ICD-10-CM | POA: Diagnosis not present

## 2019-01-05 DIAGNOSIS — M25512 Pain in left shoulder: Secondary | ICD-10-CM | POA: Diagnosis not present

## 2019-01-05 DIAGNOSIS — M25511 Pain in right shoulder: Secondary | ICD-10-CM | POA: Diagnosis not present

## 2019-01-05 NOTE — Therapy (Signed)
[place], [place], [address] Phone: [phone]   Fax:  [phone]  Physical Therapy Treatment  Patient Details  Name: Savannah [name] MRN: [id number] Date of Birth: 03-30-1945 Referring Provider (PT): Marcelle Smiling, MD   Encounter Date: 01/05/2019  PT End of Session - 01/05/19 1152    Visit Number  8    Number of Visits  12    Date for PT Re-Evaluation  01/21/19    Authorization Type  MCR  BCBS    PT Start Time  4818    PT Stop Time  1230    PT Time Calculation (min)  45 min       Past Medical History:  Diagnosis Date  . Dyspnea    MYOVIEW, 12/29/2006 - post-stress EF 81%,no ECG changes, EKG negative for ischemia  . Hyperlipidemia   . Hypertension    2D ECHO, 12/24/2006 - EF 45-55%, normal  . Swelling of extremity    LEA VENOUS DUPLEX, 08/19/2011 - deep valvular insufficiency noted with right femoral and popliteal veins and left common femoral vein  . TIA (transient ischemic attack)    CAROTID DOPPLER, 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    No past surgical history on file.  There were no vitals filed for this visit.      [facility] PT Assessment - 01/05/19 0001      Observation/Other Assessments   Focus on Therapeutic Outcomes (FOTO)   45% limited improved from 45% limited on eval                   [facility] Adult PT Treatment/Exercise - 01/05/19 0001      Knee/Hip Exercises: Aerobic   Nustep  L5 UE/LE x 6 minutes       Knee/Hip Exercises: Machines for Strengthening   Cybex Leg Press  1 plate, small ROM ,increased ROM today and no popping      Knee/Hip Exercises: Standing   Other Standing Knee Exercises  4 inch step up x 10 each, 6 inch stpe up x 10 each       Knee/Hip Exercises: Seated   Sit to Sand  10 reps   hands on thighs      Shoulder Exercises: Standing   Other Standing Exercises  rows green x 20, extension red x 20, IR, ER green band x  15 each, punch green x 15       Shoulder Exercises: ROM/Strengthening   UBE (Upper Arm Bike)  3 minutes each way                PT Short Term Goals - 12/27/18 1329      PT SHORT TERM GOAL #1   Title  She will be indpendent with initial HEP    Status  Achieved        PT Long Term Goals - 01/05/19 1150      PT LONG TERM GOAL #1   Title  She will be independent with all HEP issued    Time  6    Period  Weeks    Status  Partially Met      PT LONG TERM GOAL #2   Title  She will report  50% less catching in her knees with walking.    Baseline  20-25% improvement    Time  6    Period  Weeks    Status  On-going      PT  LONG TERM GOAL #3   Title  She will report improvement in shoulder pain due to increased strength.    Baseline  more intermittent, same intensity    Time  6    Period  Weeks    Status  On-going      PT LONG TERM GOAL #4   Title  She will be able to rise form chair and walk steps with less discomfort and improved ease    Time  6    Period  Weeks    Status  Achieved            Plan - 01/05/19 1256    Clinical Impression Statement  Pt reports 20-25% improvement in catching in knees. She tolerates leg press better today. FOTO score improved from 46% limited to 45% limited. She tolerated 6 inch step ups well today  with one episode of a catch in left knee.    PT Next Visit Plan  give green band if she did ok  try blue for UE    modalities as needed, continue closed chain LE strength    PT Home Exercise Plan  QS/SLR, side clam and abduction, rockwood shoulder , rows and extensions   RT LAQ   LT SAQ       Patient will benefit from skilled therapeutic intervention in order to improve the following deficits and impairments:  Pain, Postural dysfunction, Increased muscle spasms, Decreased range of motion, Decreased strength  Visit Diagnosis: Chronic right shoulder pain  Chronic left shoulder pain  Chronic pain of right knee  Difficulty in  walking, not elsewhere classified  Chronic pain of left knee     Problem List Patient Active Problem List   Diagnosis Date Noted  . Hearing loss due to cerumen impaction, left 12/26/2017  . TSH elevation 02/07/2017  . Raynaud's syndrome without gangrene 07/02/2016  . Osteopenia of multiple sites 07/02/2016  . Vitamin D deficiency 07/02/2016  . Primary osteoarthritis of both knees 07/02/2016  . Primary osteoarthritis of both hands 07/02/2016  . Primary osteoarthritis of both feet 07/02/2016  . Pure hyperglyceridemia 12/27/2015  . Hyperglycemia 12/27/2015  . ILD (interstitial lung disease) ([place]) 12/10/2015  . Diastolic dysfunction 93/73/4287  . HTN (hypertension) 10/07/2011  . Dyslipidemia, goal LDL below 160 10/07/2011  . Scleroderma ([place]) 10/07/2011  . Varicose veins 10/07/2011    Dorene Ar, PTA 01/05/2019, 1:16 PM  [place], [place], [address] Phone: [phone]   Fax:  [phone]  Name: Savannah [name] MRN: [id number] Date of Birth: 04-21-1945

## 2019-01-06 DIAGNOSIS — Z85828 Personal history of other malignant neoplasm of skin: Secondary | ICD-10-CM | POA: Diagnosis not present

## 2019-01-06 DIAGNOSIS — C44319 Basal cell carcinoma of skin of other parts of face: Secondary | ICD-10-CM | POA: Diagnosis not present

## 2019-01-11 ENCOUNTER — Ambulatory Visit: Payer: Medicare Other | Admitting: Physical Therapy

## 2019-01-11 ENCOUNTER — Other Ambulatory Visit: Payer: Self-pay

## 2019-01-11 DIAGNOSIS — G8929 Other chronic pain: Secondary | ICD-10-CM

## 2019-01-11 DIAGNOSIS — M25561 Pain in right knee: Secondary | ICD-10-CM

## 2019-01-11 DIAGNOSIS — R262 Difficulty in walking, not elsewhere classified: Secondary | ICD-10-CM | POA: Diagnosis not present

## 2019-01-11 DIAGNOSIS — M25512 Pain in left shoulder: Secondary | ICD-10-CM

## 2019-01-11 DIAGNOSIS — M25511 Pain in right shoulder: Secondary | ICD-10-CM | POA: Diagnosis not present

## 2019-01-11 DIAGNOSIS — M25562 Pain in left knee: Secondary | ICD-10-CM | POA: Diagnosis not present

## 2019-01-11 NOTE — Therapy (Signed)
[place], [place], [address] Phone: [phone]   Fax:  [phone]  Physical Therapy Treatment  Patient Details  Name: Savannah [name] MRN: [id number] Date of Birth: 24-Feb-1945 Referring Provider (PT): Marcelle Smiling, MD   Encounter Date: 01/11/2019  PT End of Session - 01/11/19 0842    Visit Number  9    Number of Visits  12    Date for PT Re-Evaluation  01/21/19    Authorization Type  MCR  BCBS    PT Start Time  0830    PT Stop Time  0915    PT Time Calculation (min)  45 min    Activity Tolerance  Patient tolerated treatment well    Behavior During Therapy  [facility] for tasks assessed/performed       Past Medical History:  Diagnosis Date  . Dyspnea    MYOVIEW, 12/29/2006 - post-stress EF 81%,no ECG changes, EKG negative for ischemia  . Hyperlipidemia   . Hypertension    2D ECHO, 12/24/2006 - EF 45-55%, normal  . Swelling of extremity    LEA VENOUS DUPLEX, 08/19/2011 - deep valvular insufficiency noted with right femoral and popliteal veins and left common femoral vein  . TIA (transient ischemic attack)    CAROTID DOPPLER, 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    No past surgical history on file.  There were no vitals filed for this visit.  Subjective Assessment - 01/11/19 0841    Subjective  "My knees have been bothering me the last couple of days"    Limitations  Walking;Standing    How long can you walk comfortably?  As needed    Diagnostic tests  OA  by xray.    Patient Stated Goals  She wants to  improve strength, decr pain    Currently in Pain?  Yes    Pain Score  5     Pain Location  Knee    Pain Orientation  Right;Left    Pain Descriptors / Indicators  Aching    Pain Type  Chronic pain    Pain Onset  More than a month ago                       [facility] Adult PT Treatment/Exercise - 01/11/19 0001      Knee/Hip Exercises: Aerobic   Nustep  L5  UE/LE x 6 minutes       Knee/Hip Exercises: Standing   Other Standing Knee Exercises  mini squats x 10    Other Standing Knee Exercises  6 inch step up x 10 each, 6 inch stpe up x 10 each       Knee/Hip Exercises: Seated   Sit to Sand  10 reps   hands on thighs      Knee/Hip Exercises: Supine   Bridges  Strengthening;Both;10 reps;Limitations    Bridges Limitations  decreased lift off    Straight Leg Raises  Strengthening;Both;15 reps;Limitations    Other Supine Knee/Hip Exercises  ab sets with verba and tactile cues f      Knee/Hip Exercises: Sidelying   Hip ABduction  Strengthening;Both;15 reps;Limitations    Hip ABduction Limitations  cues to keep toes pointed forward and prevent quad compensation      Shoulder Exercises: Standing   Other Standing Exercises  rows green x 20, extension red x 20, IR, ER green band x 15 each, punch green x 15  Shoulder Exercises: ROM/Strengthening   UBE (Upper Arm Bike)  3 minutes each way                PT Short Term Goals - 12/27/18 1329      PT SHORT TERM GOAL #1   Title  She will be indpendent with initial HEP    Status  Achieved        PT Long Term Goals - 01/11/19 0855      PT LONG TERM GOAL #1   Title  She will be independent with all HEP issued    Time  6    Period  Weeks    Status  Partially Met      PT LONG TERM GOAL #2   Title  She will report  50% less catching in her knees with walking.    Baseline  Pt reporting knee catching about 15-20% of the time.    Time  6    Period  Weeks    Status  On-going      PT LONG TERM GOAL #3   Title  She will report improvement in shoulder pain due to increased strength.    Time  6    Period  Weeks    Status  On-going      PT LONG TERM GOAL #4   Title  She will be able to rise form chair and walk steps with less discomfort and improved ease    Time  6    Period  Weeks    Status  Achieved            Plan - 01/11/19 9562    Clinical Impression Statement  Pt  reporting soreness in bilateral knees over the last few days. Pain reported as 5/10. Pt with basal cell removed from above R eyebrow, bruising noticable. Pt tolreating all exercises for shoulder and LEs well. We discussed upcoming discharge on 12/4. Pt feels like she has made progress since beginning therapy. Continue to progress toward pt's LTG's.    Personal Factors and Comorbidities  Time since onset of injury/illness/exacerbation;Age;Comorbidity 1    Comorbidities  scleroderma    Examination-Activity Limitations  Locomotion Level;Reach Overhead    Examination-Participation Restrictions  Community Activity    Stability/Clinical Decision Making  Stable/Uncomplicated    Rehab Potential  Good    PT Frequency  2x / week    PT Duration  6 weeks    PT Treatment/Interventions  Cryotherapy;Moist Heat;Iontophoresis 93m/ml Dexamethasone;Therapeutic exercise;Patient/family education;Manual techniques;Passive range of motion;Dry needling;Taping    PT Next Visit Plan  give green band if she did ok  try blue for UE    modalities as needed, continue closed chain LE strength    PT Home Exercise Plan  QS/SLR, side clam and abduction, rockwood shoulder , rows and extensions   RT LAQ   LT SAQ    Consulted and Agree with Plan of Care  Patient       Patient will benefit from skilled therapeutic intervention in order to improve the following deficits and impairments:  Pain, Postural dysfunction, Increased muscle spasms, Decreased range of motion, Decreased strength  Visit Diagnosis: Chronic pain of left knee  Chronic pain of right knee  Difficulty in walking, not elsewhere classified  Chronic left shoulder pain  Chronic right shoulder pain     Problem List Patient Active Problem List   Diagnosis Date Noted  . Hearing loss due to cerumen impaction, left 12/26/2017  . TSH elevation 02/07/2017  .  Raynaud's syndrome without gangrene 07/02/2016  . Osteopenia of multiple sites 07/02/2016  . Vitamin D  deficiency 07/02/2016  . Primary osteoarthritis of both knees 07/02/2016  . Primary osteoarthritis of both hands 07/02/2016  . Primary osteoarthritis of both feet 07/02/2016  . Pure hyperglyceridemia 12/27/2015  . Hyperglycemia 12/27/2015  . ILD (interstitial lung disease) ([place]) 12/10/2015  . Diastolic dysfunction 33/35/4562  . HTN (hypertension) 10/07/2011  . Dyslipidemia, goal LDL below 160 10/07/2011  . Scleroderma ([place]) 10/07/2011  . Varicose veins 10/07/2011    Oretha Caprice, PT 01/11/2019, 9:18 AM  [facility] [address] [place], [place], [address] Phone: [phone]   Fax:  [phone]  Name: Savannah [name] MRN: [id number] Date of Birth: [birth year]

## 2019-01-17 ENCOUNTER — Ambulatory Visit (INDEPENDENT_AMBULATORY_CARE_PROVIDER_SITE_OTHER): Payer: Medicare Other | Admitting: Internal Medicine

## 2019-01-17 ENCOUNTER — Other Ambulatory Visit: Payer: Self-pay

## 2019-01-17 ENCOUNTER — Encounter: Payer: Self-pay | Admitting: Internal Medicine

## 2019-01-17 VITALS — BP 126/62 | HR 82 | Ht 67.0 in | Wt 181.6 lb

## 2019-01-17 DIAGNOSIS — M359 Systemic involvement of connective tissue, unspecified: Secondary | ICD-10-CM | POA: Diagnosis not present

## 2019-01-17 DIAGNOSIS — I2510 Atherosclerotic heart disease of native coronary artery without angina pectoris: Secondary | ICD-10-CM | POA: Diagnosis not present

## 2019-01-17 DIAGNOSIS — J8489 Other specified interstitial pulmonary diseases: Secondary | ICD-10-CM | POA: Diagnosis not present

## 2019-01-17 DIAGNOSIS — M349 Systemic sclerosis, unspecified: Secondary | ICD-10-CM | POA: Diagnosis not present

## 2019-01-17 NOTE — Patient Instructions (Addendum)
ICD-10-CM   1. 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 Scleroderma ([facility])  M34.9     Stable symptoms x 1 year Stable CT scan x 2 years Breathing test stable v slight;y worse x 1 year  Overall you are stable but we might see you sooner than once a year  Resepct flu shot deferral  Plan  - 6 months do spirometry and dlco - defer flu shot 01/17/2019 due to personal choice  Followup  - ILD clinic 30 min slot in 6 months

## 2019-01-17 NOTE — Progress Notes (Signed)
IOV 12/10/2015 - history some the patient and review of the outside records from Dr. Bronson Curb in cardiology office notes 11/19/2015 Dr. Haroldine Laws  Chief Complaint  Patient presents with  . Pulmonary Consult    Referred by Dr Estanislado Pandy- Scleroderma - Had HRCT 11/22/15- SOB with walking up driveway or up steps - Denies cough or wheezing    73 year old Savannah [name] was diagnosed with scleroderma in 2008 when she was 73 years old. Notice many problems with the skin in her fingers and the form of a sclerodactyly. I According to her history and referral notes she did have a biopsy with Dr. Wilhemina Bonito that confirmed the scleroderma diagnosis     In 2008 she had dyspnea. She had a cardiac stress test at that time she had a Myoview for stress ejection fraction was 81% but resting echo showe d ejection fraction of 45%. She does not remember this but this is per cardiology notes. He tells me now that she's had insidious onset of dyspnea for the last few years. It has been steadily progressive. It is moderate in intensity. She notices this when she climbs stairs or goes to [employer]. It is relieved by rest. It is not associated with any chest pain. This no associated cough or edema or orthopnea paroxysmal nocturnal dyspnea presyncope. According to the cardiology notes she snores a lot.  She had echocardiogram this fall 2017 document below that shows grade 1 diastolic dysfunction with good left ventricular ejection fraction. Right ventricular pump function is normal. No comment on pulmonary systolic pressures.  She underwent pulmonary function test that shows isolated reduction in diffusion capacity documented below. She then underwent high-resolution CT chest that I personally visualized shows some faint groundglass opacities. Therefore she has been referred here.  She tells me that for her scleroderma she's been treated with methotrexate in The past. She Does Have the Details. She Says This Did Not Help  Her. Overall She Is a Nonsmoker. She Drinks Alcohol Socially. She Does Walking for Exercise but Does Have Right Knee Joint Discomfort. Rheumatology Notes Indicate Significant Issues with Osteoarthritis Especially of the Right Knee Is Documented in Office Visits in November 2016 and Subsequently.Also arthritis is also documented in her hand joints   Tests below  Pulmonary function test 11/09/2015 FVC 2.4 L/1%, FEV1 2.6 L/104%. DLCO 5.3/95% and DLCO 17.03/60%. She has isolated reduction in diffusion capacity  IMPRESSION: HRCT 11/22/15 1. Mild patchy subpleural ground-glass attenuation and reticulation in both lungs, most prominent in the lower lobes. No significant traction bronchiectasis. No frank honeycombing. 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 A follow-up high-resolution chest CT study in 12 months would be useful to assess temporal pattern stability. 2. Additional findings include aortic atherosclerosis, three-vessel coronary atherosclerosis and possible 0.9 cm lower left thyroid lobe nodule.   Electronically Signed   By: [name] M.D.   On: 11/22/2015 13:14  Walking desaturation test 185 feet 3 laps on room air today in the office 12/10/2015: Resting pulse ox was 94% after 3 laps pulse ox was 98%. She had some pain with her right knee where she had no dyspnea. Resting heart rate was 87/m [place] to 93/m  Echocardiogram 11/19/2015 shows grade 1 diastolic dysfunction but otherwise reported as normal. Including right ventricle    OV 10/09/2016  Chief Complaint  Patient presents with    . Follow-up    Pt states she feels she is not able to breathe  in enough air. Pt denies significant cough. Pt states she has chest tightness at random times. Pt denies f/c/s.    Follow-up scleroderma with ILD associated with mild reduction in diffusion capacity - mild severity of disease on observation therapy  Last seen  October 2017. Since then she's been on observation therapy. She had pulmonary function testing in June 2018 that in the last 2 years is showing mild decline in diffusion capacity. She tells me that correlating with this she is feeling more shortness of breath with exertion and even at rest she feels that she has to take a deep breath. There is mild associated cough occasionally but there is no wheeze. There is no fever or edema. Otherwise feeling well. She has some questions about getting pneumonia vaccine. Apparently she's getting an alert about this. Review of the chart shows that she is up-to-date with both Pneumovax and Prevnar within 5 years  Walking desaturation test 185 feet 3 laps on room air: Walking desaturation test on 10/09/2016 185 feet x 3 laps on RA :  did NOT  desaturate. Rest pulse ox was 98%, final pulse ox was 98%. HR response 83/min at rest to 98/min at peak exertion.    OV 12/17/2016  Chief Complaint  Patient presents with  . Follow-up    pft done today and HRCT done 10/23. 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 This continues unchanged. Within the staging workup high-resolution CT scan of the chest shows mild subtle interstitial lung disease that is unchanged compared to one year ago. However on pulmonary function tests this seems to be a trend was decline in both FVC and DLCO. If there is worsening interstitial lung disease this is not reflected on the CT scan [as yet] we also look for pulmonary hypertension with echocardiogram which she had October 2018 shows grade 1 diastolic dysfunctionbut no documentation of pulmonary hypertension. She has never been to pulmonary rehabilitation because of her knee issues she is a bit deconditioned. She is interested in pulmonary rehabilitation but she wants to get this  done in [place] where she lives between January and May 2019    IMPRESSION: compared to oct 2017 1. 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 2. Aortic atherosclerosis, in addition to left main and 3 vessel coronary artery disease. Please note that although the presence of coronary artery calcium documents the presence of coronary artery disease, the severity of this disease and any potential stenosis cannot be assessed on this non-gated CT examination. Assessment for potential risk factor modification, dietary therapy or pharmacologic therapy may be warranted, if clinically indicated.  Aortic Atherosclerosis (ICD10-I70.0).   Electronically Signed   By: Vinnie Langton M.D.   On: 12/09/2016 12:07  OV 12/16/2017  Subjective:  Patient ID: Savannah [name], Savannah [name] , DOB: 20-Apr-1945 , age 66 y.o. , MRN: [id number] , AD[address]ESS: [address] [place] [phone]   12/16/2017 -   Chief Complaint  Patient presents with  . Follow-up    shortness of breath     HPI Savannah [name] 73 y.o. 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  Stable over time.  She returns for follow-up.  She spent the time from January through September in [place].  She was supposed to return in July but her return got delayed.  Therefore she is here for this follow-up visit in October 2019.  And once again in January 2020 all the way through July 2020 she will be back in [place] in [place].  At this point in time she is continues to do well without any worsening shortness of breath.  She can do activities of daily living and climb hills and stairs without a problem.  Although she does feel that the edema in her fingers is slowly getting worse particularly in the right hand.  There is no chest pain or any other side effects.  She had pulmonary function test  today that shows continued stability over 3-1/2 years.      OV 01/17/2019  Subjective:  Patient ID: Savannah [name], Savannah [name] , DOB: 1945-08-01 , age 21 y.o. , MRN: [id number] , AD[address]ESS: [address] [place] [phone]   01/17/2019 -   Chief Complaint  Patient presents with  . Follow-up    Pt states she has been doing good since last visit. Pt denies any complaints of any cough and states SOB is about the same since last visit.   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  HPI Savannah [name] 73 y.o. -she returns for 1 year follow-up.  She says overall she is stable in terms of her shortness of breath.  She had a high-resolution CT chest which is a 2-year follow-up.  Reported as stable.  Her symptom scores are listed below.  She did see cardiology Dr. Haroldine Laws who has been reassured with her echocardiogram.  She does have coronary artery calcification he is aware of this.  She did have pulmonary function test today and her DLCO seems fluctuant but stable and a slight decline in her FVC.  She is surprised by this because overall she feels stable.  She is not had a flu shot and she defers a flu shot every year.  She is asking about the Covid vaccine.  She is social distancing and does not spend her time with any human clusyer r of more than 2 people over the holiday weekend.  SYMPTOM SCALE - ILD 01/17/2019   O2 use ra  Shortness of Breath 0 -> 5 scale with 5 being worst (score 6 If unable to do)  At rest 0  Simple tasks - showers, clothes change, eating, shaving 0  Household (dishes, doing bed, laundry) 2  Shopping 2  Walking level at own pace 2  Walking keeping up with others of same age 20  Walking up Stairs 3  Walking up Hill 3  Total (40 - 48) Dyspnea Score 15  How bad is your cough? 0  How bad is your fatigue 2       Results for KYONG, DESMIDT (MRN [id number]) as of 12/17/2016 11:16  Ref. Range 08/08/2014 09:58 11/09/2015 10:48  08/04/2016 10:57 12/17/2016 09:43 12/16/2017  12/29/2018  FVC-Pre Latest Units: L 3.36 3.39 3.24 3.21 3.26 3.15  FVC-%Pred-Pre Latest Units: % 99 101 98 97 98 100%   Results for AYAAN, GOODELL A (MRN [id number]) as of 12/17/2016 11:16  Ref. Range 08/08/2014 09:58 11/09/2015 10:48 08/04/2016 10:57 12/17/2016 09:43 12/16/2017  01/17/2019 GLI equation  DLCO unc Latest Units: ml/min/mmHg 18.83 17.03 16.89 16.59 18.03 16.54  DLCO unc % pred Latest Units: % 66 60 59 58 63 79%     Simple office walk 185 feet x  3 laps  goal with forehead probe 12/16/2017  01/17/2019   O2 used Room air Room air  Number laps completed 3 3  Comments about pace normal avg pace  Resting Pulse Ox/HR 98% and 70/min 100% and 82/min  Final Pulse Ox/HR 99% and 90/min 98% and 99/min  Desaturated </= 88% no no  Desaturated <= 3% points no no  Got Tachycardic >/= 90/min yes yes  Symptoms at end of test x nomne  Miscellaneous comments x x    HRCT compared to Oct 2018 -> OCt 2020 IMPRESSION: 1. Moderate patchy air trapping, indicative of small airways disease, unchanged. 2. Minimal patchy subpleural reticulation and ground-glass attenuation in the lungs, slightly less prominent in the interval. Stable scattered mild cylindrical bronchiolectasis in the mid to lower lungs. No progressive findings. Findings could represent a very mild nonspecific interstitial pneumonia (NSIP). Findings are not compatible with usual interstitial pneumonia (UIP). Findings are suggestive of an alternative diagnosis (not UIP) per consensus guidelines: Diagnosis of Idiopathic Pulmonary Fibrosis: An Official ATS/ERS/JRS/ALAT Clinical Practice Guideline. Jud, Iss 5, [phone], Oct 18 2016. 3. Three-vessel coronary atherosclerosis.  Aortic Atherosclerosis (ICD10-I70.0).   Electronically Signed   By: [name] M.D.   On: 12/07/2018 15:43  ROS - per HPI     has a past medical history of Dyspnea,  Hyperlipidemia, Hypertension, Swelling of extremity, and TIA (transient ischemic attack).   reports that she has never smoked. She has never used smokeless tobacco.  No past surgical history on file.  Allergies  Allergen Reactions  . Codeine Nausea Only  . Cymbalta [Duloxetine Hcl] Hives  . Lyrica [Pregabalin] Hives  . Penicillins   . Sulfa Antibiotics Hives  . Zetia [Ezetimibe] Other (See Comments)    Myalgia  . Zocor [Simvastatin] Other (See Comments)    Myalgia    Immunization History  Administered Date(s) Administered  . Pneumococcal Conjugate-13 11/20/2014  . Pneumococcal Polysaccharide-23 11/17/2013  . Tdap 07/29/2016  . Zoster 12/09/2009    Family History  Problem Relation Age of Onset  . Hyperlipidemia Father   . Cirrhosis Father 77  . Kidney failure Father 48  . Stroke Maternal Grandmother 85  . Hypertension Maternal Grandmother 40  . Angina Maternal Grandfather 22  . Heart attack Maternal Grandfather 88  . Lung disease Paternal Grandmother   . Atrial fibrillation Mother 62  . Hypertension Mother      Current Outpatient Medications:  .  aspirin 81 MG tablet, Take 81 mg by mouth., Disp: , Rfl:  .  Cholecalciferol (D3 MAXIMUM STRENGTH PO), Take 5,000 Units by mouth 2 (two) times a week. , Disp: , Rfl:  .  Coenzyme Q10 200 MG capsule, Take 200 mg by mouth 2 (two) times a week., Disp: , Rfl:  .  diphenhydrAMINE (BENADRYL) 25 mg capsule, Take 25 mg by mouth every 6 (six) hours as needed., Disp: , Rfl:  .  ezetimibe (ZETIA) 10 MG tablet, Take 10 mg by mouth 2 (two) times a week., Disp: , Rfl:  .  furosemide (LASIX) 20 MG tablet, Take 1 tablet (20 mg total) by mouth daily as needed for fluid or edema., Disp: 90 tablet, Rfl: 3 .  Icosapent Ethyl 1 g CAPS, Take 1 g by mouth once a week., Disp: , Rfl:  .  loratadine (CLARITIN) 10 MG tablet, Take 10 mg by mouth daily as needed for allergies., Disp: , Rfl:  .  losartan (COZAAR) 100 MG tablet, TAKE ONE TABLET BY MOUTH  ONE TIME DAILY. PT OVERDUE FOR OV PLEASE CALL FOR APPOINTMENT, Disp: 90 tablet, Rfl: 0 .  Menaquinone-7 (VITAMIN K2 PO), Take 50 mcg by mouth 2 (two) times a week. , Disp: , Rfl:  .  naproxen sodium (ALEVE) 220 MG tablet, Take 220 mg by mouth as needed. , Disp: , Rfl:  .  potassium chloride SA (KLOR-CON M20) 20 MEQ tablet, Take 1 tablet (20 mEq total) by mouth daily as needed. When you take Furosemide (Lasix)., Disp: 90 tablet, Rfl: 3 .  Vitamin E 45 MG CAPS, 400 Units daily. , Disp: , Rfl:       Objective:   Vitals:   01/17/19 1153  BP: 126/62  Pulse: 82  SpO2: 100%  Weight: 181 lb 9.6 oz (82.4 kg)  Height: 5\' 7"  (1.702 m)    Estimated body mass index is 28.44 kg/m as calculated from the following:   Height as of this encounter: 5\' 7"  (1.702 m).   Weight as of this encounter: 181 lb 9.6 oz (82.4 kg).  @WEIGHTCHANGE @  [employer]   01/17/19 1153  Weight: 181 lb 9.6 oz (82.4 kg)     Physical Exam  General Appearance:    Alert, cooperative, no distress, appears stated age - yes , Deconditioned looking - no , OBESE  - no, Sitting on Wheelchair -  no  Head:    Normocephalic, without obvious abnormality, atraumatic  Eyes:    PERRL, conjunctiva/corneas clear,  Ears:    Normal TM's and external ear canals, both ears  Nose:   Nares normal, septum midline, mucosa normal, no drainage    or sinus tenderness. OXYGEN ON  - no . Patient is @ ra   Throat:   Lips, mucosa, and tongue normal; teeth and gums normal. Cyanosis on lips - no  Neck:   Supple, symmetrical, trachea midline, no adenopathy;    thyroid:  no enlargement/tenderness/nodules; no carotid   bruit or JVD  Back:     Symmetric, no curvature, ROM normal, no CVA tenderness  Lungs:     Distress - no , Wheeze no, Barrell Chest - no, Purse lip breathing - no, Crackles - no   Chest Wall:    No tenderness or deformity.    Heart:    Regular rate and rhythm, S1 and S2 normal, no rub   or gallop, Murmur - no  Breast Exam:    NOT  DONE  Abdomen:     Soft, non-tender, bowel sounds active all four quadrants,    no masses, no organomegaly. Visceral obesity - no  Genitalia:   NOT DONE  Rectal:   NOT DONE  Extremities:   Extremities - normal, Has Cane - no, Clubbing - no, Edema - no  Pulses:   2+ and symmetric all extremities  Skin:   Stigmata of Connective Tissue Disease - mild skin tightening  Lymph nodes:   Cervical, supraclavicular, and axillary nodes normal  Psychiatric:  Neurologic:   Pleasant - yes, Anxious - no, Flat affect - no  CAm-ICU - neg, Alert and Oriented x 3 - yes, Moves all 4s - yes, Speech - normal, Cognition - intact           Assessment:       ICD-10-CM   1. Interstitial lung disease due to connective tissue disease ([place])  J84.89 Pulmonary function test   M35.9   2. Scleroderma ([facility])  M34.9        Plan:     Patient Instructions  ICD-10-CM   1. Interstitial lung disease due to connective tissue disease ([place])  J84.89    M35.9   2. Scleroderma ([facility])  M34.9     Stable symptoms x 1 year Stable CT scan x 2 years Breathing test stable v slight;y worse x 1 year  Overall you are stable but we might see you sooner than once a year  Resepct flu shot deferral  Plan  - 6 months do spirometry and dlco - defer flu shot 01/17/2019 due to personal choice  Followup  - ILD clinic 30 min slot in 6 months    (> 50% of this 15 min visit spent in  face to face counseling or/and coordination of care by this undersigned MD - Dr Brand Males. This includes one or more of the following documented above: discussion of test results, diagnostic or treatment recommendations, prognosis, risks and benefits of management options, instructions, education, compliance or risk-factor reduction)    SIGNATURE    Dr. Brand Males, M.D., F.C.C.P,  Pulmonary and Critical Care Medicine Staff Physician, [place] Director - Interstitial Lung Disease  Program  [facility] [place], [place], [address]  Pager: [phone], If no answer or between  15:00h - 7:00h: call [phone] Telephone: [phone]  12:43 PM 01/17/2019

## 2019-01-18 ENCOUNTER — Encounter: Payer: Self-pay | Admitting: Physical Therapy

## 2019-01-18 ENCOUNTER — Ambulatory Visit: Payer: Medicare Other | Attending: Rheumatology | Admitting: Physical Therapy

## 2019-01-18 DIAGNOSIS — R262 Difficulty in walking, not elsewhere classified: Secondary | ICD-10-CM | POA: Diagnosis present

## 2019-01-18 DIAGNOSIS — M25561 Pain in right knee: Secondary | ICD-10-CM | POA: Diagnosis present

## 2019-01-18 DIAGNOSIS — M25511 Pain in right shoulder: Secondary | ICD-10-CM | POA: Diagnosis present

## 2019-01-18 DIAGNOSIS — G8929 Other chronic pain: Secondary | ICD-10-CM | POA: Insufficient documentation

## 2019-01-18 DIAGNOSIS — M25512 Pain in left shoulder: Secondary | ICD-10-CM | POA: Diagnosis present

## 2019-01-18 DIAGNOSIS — M25562 Pain in left knee: Secondary | ICD-10-CM | POA: Diagnosis present

## 2019-01-18 NOTE — Therapy (Addendum)
[place], [place], [address] Phone: [phone]   Fax:  [phone]  Physical Therapy Treatment  Patient Details  Name: AVERYANNA [name] MRN: [id number] Date of Birth: 1945/04/30 Referring Provider (PT): Marcelle Smiling, MD  Progress Note Reporting Period 12/07/18 to 01/18/19  See note below for Objective Data and Assessment of Progress/Goals.      Encounter Date: 01/18/2019  PT End of Session - 01/18/19 1024    Visit Number  10    Number of Visits  12    Date for PT Re-Evaluation  01/21/19    Authorization Type  MCR  BCBS    PT Start Time  1018    PT Stop Time  1100    PT Time Calculation (min)  42 min       Past Medical History:  Diagnosis Date  . Dyspnea    MYOVIEW, 12/29/2006 - post-stress EF 81%,no ECG changes, EKG negative for ischemia  . Hyperlipidemia   . Hypertension    2D ECHO, 12/24/2006 - EF 45-55%, normal  . Swelling of extremity    LEA VENOUS DUPLEX, 08/19/2011 - deep valvular insufficiency noted with right femoral and popliteal veins and left common femoral vein  . TIA (transient ischemic attack)    CAROTID DOPPLER, 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    History reviewed. No pertinent surgical history.  There were no vitals filed for this visit.  Subjective Assessment - 01/18/19 1021    Subjective  Feeling pretty good. One little knee grab on the left. Shoulders are doing well. No pain right now.    Currently in Pain?  Yes    Pain Score  5    intermittent   Pain Location  Knee    Pain Orientation  Left    Pain Descriptors / Indicators  --   grabbing   Pain Type  Chronic pain    Pain Frequency  Intermittent    Pain Score  0    Pain Location  Shoulder    Pain Orientation  Right;Left                       [facility] Adult PT Treatment/Exercise - 01/18/19 0001      Knee/Hip Exercises: Aerobic   Nustep  L5 UE/LE x 6 minutes        Knee/Hip Exercises: Standing   Other Standing Knee Exercises  mini squats x 10    Other Standing Knee Exercises  6 inch step up x 12 each       Knee/Hip Exercises: Seated   Sit to Sand  10 reps   needs UE      Knee/Hip Exercises: Supine   Bridges  Strengthening;Both;10 reps;Limitations    Straight Leg Raises  Strengthening;Both;15 reps;Limitations      Shoulder Exercises: Standing   Other Standing Exercises  rows green x 20, extension red x 20, IR, ER green band x 15 each, punch green x 15       Shoulder Exercises: ROM/Strengthening   UBE (Upper Arm Bike)  3 minutes each way                PT Short Term Goals - 12/27/18 1329      PT SHORT TERM GOAL #1   Title  She will be indpendent with initial HEP    Status  Achieved        PT Long Term Goals -  01/11/19 0855      PT LONG TERM GOAL #1   Title  She will be independent with all HEP issued    Time  6    Period  Weeks    Status  Partially Met      PT LONG TERM GOAL #2   Title  She will report  50% less catching in her knees with walking.    Baseline  Pt reporting knee catching about 15-20% of the time.    Time  6    Period  Weeks    Status  On-going      PT LONG TERM GOAL #3   Title  She will report improvement in shoulder pain due to increased strength.    Time  6    Period  Weeks    Status  On-going      PT LONG TERM GOAL #4   Title  She will be able to rise form chair and walk steps with less discomfort and improved ease    Time  6    Period  Weeks    Status  Achieved            Plan - 01/18/19 1024    Clinical Impression Statement  Pt reports intermittent left knee grab today and no shoulder pain. She reports improvement in strength of shoulders. She reports walking on T.M. okay for 1 mile however not with walking outdoors, knee tends to catch, grab. She has decreased endurance with step ups and does not toelrate lateral step ups. Needs UE on thighs to rise from mat after several reps. She is  scheduled for re-eval to determine if DC is appropriate.    PT Next Visit Plan  dicharge vs ERO next visit, needs a progress note added to 10th visit or previous.    PT Home Exercise Plan  QS/SLR, side clam and abduction, rockwood shoulder , rows and extensions   RT LAQ   LT SAQ       Patient will benefit from skilled therapeutic intervention in order to improve the following deficits and impairments:  Pain, Postural dysfunction, Increased muscle spasms, Decreased range of motion, Decreased strength  Visit Diagnosis: Chronic pain of left knee  Chronic pain of right knee  Difficulty in walking, not elsewhere classified  Chronic left shoulder pain  Chronic right shoulder pain     Problem List Patient Active Problem List   Diagnosis Date Noted  . Hearing loss due to cerumen impaction, left 12/26/2017  . TSH elevation 02/07/2017  . Raynaud's syndrome without gangrene 07/02/2016  . Osteopenia of multiple sites 07/02/2016  . Vitamin D deficiency 07/02/2016  . Primary osteoarthritis of both knees 07/02/2016  . Primary osteoarthritis of both hands 07/02/2016  . Primary osteoarthritis of both feet 07/02/2016  . Pure hyperglyceridemia 12/27/2015  . Hyperglycemia 12/27/2015  . ILD (interstitial lung disease) ([place]) 12/10/2015  . Diastolic dysfunction 74/94/4967  . HTN (hypertension) 10/07/2011  . Dyslipidemia, goal LDL below 160 10/07/2011  . Scleroderma ([place]) 10/07/2011  . Varicose veins 10/07/2011    Dorene Ar, PTA 01/18/2019, 11:02 AM  [facility] [address] [place], [place], [address] Phone: [phone]   Fax:  [phone]  Name: Savannah [name] MRN: [id number] Date of Birth: 01-01-1946

## 2019-01-20 ENCOUNTER — Telehealth: Payer: Self-pay | Admitting: Rheumatology

## 2019-01-20 ENCOUNTER — Other Ambulatory Visit: Payer: Self-pay

## 2019-01-20 ENCOUNTER — Ambulatory Visit: Payer: Medicare Other

## 2019-01-20 DIAGNOSIS — M25512 Pain in left shoulder: Secondary | ICD-10-CM

## 2019-01-20 DIAGNOSIS — M25562 Pain in left knee: Secondary | ICD-10-CM | POA: Diagnosis not present

## 2019-01-20 DIAGNOSIS — R262 Difficulty in walking, not elsewhere classified: Secondary | ICD-10-CM

## 2019-01-20 DIAGNOSIS — G8929 Other chronic pain: Secondary | ICD-10-CM

## 2019-01-20 DIAGNOSIS — M8589 Other specified disorders of bone density and structure, multiple sites: Secondary | ICD-10-CM

## 2019-01-20 DIAGNOSIS — M25561 Pain in right knee: Secondary | ICD-10-CM

## 2019-01-20 NOTE — Telephone Encounter (Signed)
Patient finished with physical therapy today, and the recommended she have a Bone Density done. Patient is having pain with her left hip now. Patient wanted to know if we had information on where, and when she went last for a Bone Density? Please call to advise.

## 2019-01-20 NOTE — Therapy (Addendum)
[place], [place], [address] Phone: [phone]   Fax:  [phone]  Physical Therapy Treatment/Discharge  Patient Details  Name: Savannah [name] MRN: [id number] Date of Birth: February 12, 1946 Referring Provider (PT): Marcelle Smiling, MD   Encounter Date: 01/20/2019  PT End of Session - 01/20/19 1155    Visit Number  11    Number of Visits  12    Date for PT Re-Evaluation  01/21/19    Authorization Type  MCR  BCBS    PT Start Time  1045    PT Stop Time  1130    PT Time Calculation (min)  45 min    Activity Tolerance  Patient tolerated treatment well;Patient limited by pain    Behavior During Therapy  [facility] for tasks assessed/performed       Past Medical History:  Diagnosis Date  . Dyspnea    MYOVIEW, 12/29/2006 - post-stress EF 81%,no ECG changes, EKG negative for ischemia  . Hyperlipidemia   . Hypertension    2D ECHO, 12/24/2006 - EF 45-55%, normal  . Swelling of extremity    LEA VENOUS DUPLEX, 08/19/2011 - deep valvular insufficiency noted with right femoral and popliteal veins and left common femoral vein  . TIA (transient ischemic attack)    CAROTID DOPPLER, 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    History reviewed. No pertinent surgical history.  There were no vitals filed for this visit.  Subjective Assessment - 01/20/19 1048    Subjective  No pain to start.   Shoulders good.  Knees and shoulders continue to grab.    Currently in Pain?  No/denies                       [facility] Adult PT Treatment/Exercise - 01/20/19 0001      Knee/Hip Exercises: Aerobic   Recumbent Bike  L2 5 min      Shoulder Exercises: Standing   Other Standing Exercises  rows green x 25, extension green x 20, IR, ER green band x 15 each, punch green x 15       Shoulder Exercises: ROM/Strengthening   UBE (Upper Arm Bike)  3 minutes each way L3      REviewed  Le exercises and  decided to limit closed chain activity due to increased pain.   The bike is not appropriate for her and she may be able to use [facility] at [place] - 01/20/19 1214    Education Details  Continue HEP 3x/week. Limit ROM as needed to not strain joint. Do less closed chain exercises as she is already doing 30-50 step up and down at home each day. Nustep at gym if comfortable /can access    Person(s) Educated  Patient    Methods  Explanation    Comprehension  Verbalized understanding       PT Short Term Goals - 01/20/19 1056      PT SHORT TERM GOAL #1   Title  She will be indpendent with initial HEP    Status  Achieved        PT Long Term Goals - 01/20/19 1056      PT LONG TERM GOAL #1   Title  She will be independent with all HEP issued    Baseline  Does not do the HEP very often  Does everythign and takes care of spuse  Status  Partially Met      PT LONG TERM GOAL #2   Title  She will report  50% less catching in her knees with walking.    Baseline  Pt reporting knee catching about 15-20% of the time.    Status  Partially Met      PT LONG TERM GOAL #3   Title  She will report improvement in shoulder pain due to increased strength.    Baseline  more intermittent, same intensity    Status  Not Met      PT LONG TERM GOAL #4   Title  She will be able to rise form chair and walk steps with less discomfort and improved ease    Status  Not Met            Plan - 01/20/19 1156    Clinical Impression Statement  Continues with knee and shoulder pain with catching. She is not doing her HEP as requested as she reports very active with home and careing for spouse.   We have talked that she needs to do her HEP on a regular basis with at least the basics avoiding weight bearing exercise. She is also having LT medial thigh cramping on and occasional basis. SHe has stiffness ofhip and pain at end range of flexion and rotaiton in LT hip.    PT  Treatment/Interventions  Cryotherapy;Moist Heat;Iontophoresis 65m/ml Dexamethasone;Therapeutic exercise;Patient/family education;Manual techniques;Passive range of motion;Dry needling;Taping    PT Next Visit Plan  Discharge with HEP due to lack of progress.    PT Home Exercise Plan  QS/SLR, side clam and abduction, rockwood shoulder , rows and extensions   RT LAQ   LT SAQ    Consulted and Agree with Plan of Care  Patient       Patient will benefit from skilled therapeutic intervention in order to improve the following deficits and impairments:  Pain, Postural dysfunction, Increased muscle spasms, Decreased range of motion, Decreased strength  Visit Diagnosis: Chronic pain of right knee  Chronic pain of left knee  Difficulty in walking, not elsewhere classified  Chronic left shoulder pain  Chronic right shoulder pain     Problem List Patient Active Problem List   Diagnosis Date Noted  . Hearing loss due to cerumen impaction, left 12/26/2017  . TSH elevation 02/07/2017  . Raynaud's syndrome without gangrene 07/02/2016  . Osteopenia of multiple sites 07/02/2016  . Vitamin D deficiency 07/02/2016  . Primary osteoarthritis of both knees 07/02/2016  . Primary osteoarthritis of both hands 07/02/2016  . Primary osteoarthritis of both feet 07/02/2016  . Pure hyperglyceridemia 12/27/2015  . Hyperglycemia 12/27/2015  . ILD (interstitial lung disease) ([place] 12/10/2015  . Diastolic dysfunction 115/40/0867 . HTN (hypertension) 10/07/2011  . Dyslipidemia, goal LDL below 160 10/07/2011  . Scleroderma ([facility] 10/07/2011  . Varicose veins 10/07/2011    [name] PT 01/20/2019, 1:38 PM  [name] Name: Savannah LEATONMRN: [id number] of Birth: [birth year] PHYSICAL THERAPY DISCHARGE SUMMARY  Visits from Start of Care: 11  Current  functional level related to goals / functional outcomes: See above   Remaining deficits: See above   Education / Equipment: HEP Plan: Patient agrees to discharge.  Patient goals were not met. Patient is being discharged due to lack of progress.  ?????    SPearson Forster PT  02/24/19

## 2019-01-21 NOTE — Telephone Encounter (Signed)
Advised patient last dexa was 12/2015 and she is due to update DEXA. Patient verbalized understanding and order has been placed for [facility].

## 2019-01-21 NOTE — Telephone Encounter (Signed)
She is due to update DEXA. Ok to place order for DEXA at [facility].

## 2019-01-21 NOTE — Telephone Encounter (Signed)
Last dexa 12/2015. Okay to place new order?

## 2019-01-24 ENCOUNTER — Other Ambulatory Visit: Payer: Self-pay | Admitting: Internal Medicine

## 2019-01-24 DIAGNOSIS — A6009 Herpesviral infection of other urogenital tract: Secondary | ICD-10-CM

## 2019-01-24 DIAGNOSIS — Z23 Encounter for immunization: Secondary | ICD-10-CM | POA: Diagnosis not present

## 2019-01-24 MED ORDER — VALACYCLOVIR HCL 1 G PO TABS: 1000.0000 mg | ORAL_TABLET | Freq: Every day | ORAL | 0 refills | Status: DC

## 2019-01-25 ENCOUNTER — Other Ambulatory Visit: Payer: Self-pay

## 2019-01-25 ENCOUNTER — Ambulatory Visit (INDEPENDENT_AMBULATORY_CARE_PROVIDER_SITE_OTHER): Payer: Medicare Other

## 2019-01-25 DIAGNOSIS — Z23 Encounter for immunization: Secondary | ICD-10-CM

## 2019-02-01 ENCOUNTER — Other Ambulatory Visit: Payer: Self-pay | Admitting: Cardiovascular Disease

## 2019-02-01 LAB — HM DEXA SCAN: HM Dexa Scan: -1.8

## 2019-02-01 NOTE — Telephone Encounter (Signed)
Rx request sent to pharmacy.  

## 2019-02-02 ENCOUNTER — Other Ambulatory Visit: Payer: Self-pay | Admitting: Obstetrics & Gynecology

## 2019-02-02 DIAGNOSIS — Z1231 Encounter for screening mammogram for malignant neoplasm of breast: Secondary | ICD-10-CM

## 2019-02-03 ENCOUNTER — Telehealth: Payer: Self-pay

## 2019-02-03 NOTE — Telephone Encounter (Signed)
DEXA scan.  02/01/2019  T-score: -1.8 BMD: 0.651  Dr. Estanislado Pandy reviewed and DEXA is stable, continue calcium/ vitamin D.   Advised patient of DEXA results and recommendations, patient verbalized understanding.

## 2019-02-10 ENCOUNTER — Other Ambulatory Visit: Payer: Self-pay | Admitting: Cardiovascular Disease

## 2019-03-08 ENCOUNTER — Ambulatory Visit (INDEPENDENT_AMBULATORY_CARE_PROVIDER_SITE_OTHER): Payer: Medicare Other | Admitting: Cardiovascular Disease

## 2019-03-08 ENCOUNTER — Ambulatory Visit: Payer: Medicare Other | Admitting: Cardiovascular Disease

## 2019-03-08 ENCOUNTER — Other Ambulatory Visit: Payer: Self-pay

## 2019-03-08 DIAGNOSIS — E782 Mixed hyperlipidemia: Secondary | ICD-10-CM

## 2019-03-08 DIAGNOSIS — J849 Interstitial pulmonary disease, unspecified: Secondary | ICD-10-CM

## 2019-03-08 DIAGNOSIS — I700 Atherosclerosis of aorta: Secondary | ICD-10-CM

## 2019-03-08 DIAGNOSIS — Z79899 Other long term (current) drug therapy: Secondary | ICD-10-CM | POA: Diagnosis not present

## 2019-03-08 DIAGNOSIS — I2510 Atherosclerotic heart disease of native coronary artery without angina pectoris: Secondary | ICD-10-CM

## 2019-03-08 DIAGNOSIS — I5189 Other ill-defined heart diseases: Secondary | ICD-10-CM

## 2019-03-08 DIAGNOSIS — M349 Systemic sclerosis, unspecified: Secondary | ICD-10-CM

## 2019-03-08 DIAGNOSIS — I10 Essential (primary) hypertension: Secondary | ICD-10-CM | POA: Diagnosis not present

## 2019-03-08 DIAGNOSIS — E785 Hyperlipidemia, unspecified: Secondary | ICD-10-CM | POA: Diagnosis not present

## 2019-03-08 MED ORDER — ICOSAPENT ETHYL 1 G PO CAPS: 1.0000 g | ORAL_CAPSULE | ORAL | 2 refills | Status: DC

## 2019-03-08 NOTE — Progress Notes (Signed)
Cardiology Office Note    Date:  03/10/2019   ID:  Savannah [name], Savannah [name] Jul 25, 1945, MRN [id number]  PCP:  Janith Lima, MD  Cardiologist:  Shelva Majestic, MD   No chief complaint on file.  F/U cardiology evaluation   History of Present Illness:  KATRIA BOTTS is a 74 y.o. female who presents for an 3 month f/u cardiology evaluation.  Ms. Hernandes has a history of scleroderma, TIA, hypertension, mixed hyperlipidemia, as well as lower extremity varicose veins.  She has biopsy-proven scleroderma biopsy on her hand around 2005.  She has had issues with shortness of breath in the past.  In 2008, her ejection fraction was 45 to 55% by echocardiography and a Myoview study November 2008 was negative for ischemia with hyperdynamic post stress ejection fraction.  She has been seen by Dr. Haroldine Laws.  On CT imaging she was found to have mild interstitial lung disease for which she subsequently was referred to Dr. Chase Caller.  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  On that CT she was also noted to have aortic atherosclerosis in addition to left main and three-vessel coronary artery disease.  However this was not gated and assessment of obstructive stenoses could not be made.  On December 23, 2017 she underwent a follow-up echo Doppler study which showed an EF of 55 to 60%.  LV strain was normal at -17.4%.  She did not have regional wall motion abnormalities.  She had mild calcification of her aortic leaflets.  There is trivial MR and PR.  PA pressures were not increased.  She last saw Dr. Haroldine Laws in November 2019 and presents now for cardiology care and risk management.  She has a history of significant mixed hyperlipidemia and is now on the Vascepa and Livalo, apparently has only been taking Livalo maybe once or at most twice per week.  Admits to  rare to occasional palpitations.  She denies recent chest tightness.  She denies presyncope or syncope.  I have cared for her husband.  She has a history of snoring but states her sleep is restorative.  She typically goes to [place] from mid January through June.  When I saw her in December 2019 I recommended that she undergo coronary CT angiography to further elucidate her coronary anatomy after she was found to have plaque on her chest CT.  This was performed on March 01, 2018.  Her calcium score was 20 which was only 25 percentile for age and sex.  There was mild plaque noted with less than 30% stenosis in the proximal and mid LAD, circumflex and right coronary arteries.  She had a normal aortic root at 3.3 cm.  I also recommended Vascepa in addition to Livalo for her mixed hyperlipidemia and added Zetia for an additional 20% lowering.  She went to [place] from January until October and returned last week.  Apparently she has only been taking Zetia 2 times per week and is only been taking Vascepa 2 times per week.  She has not had relaboratory.  She is no longer taking Livalo and apparently stopped taking it.  She denied chest pain PND orthopnea.  She will again begin to [place] in early 2021.    Since I last saw her, she had laboratory in October 2020.  Total cholesterol was 167, triglycerides 185, HDL 41, LDL 94.  At that time, I had suggested the patient increase Zetia to 10 mg daily and also increase Vascepa to daily and she had only been taking these medicines 2 days/week.  She was evaluated by Dr. Haroldine Laws in November 2020.  An echo Doppler study December 30, 2018 showed EF 60 to 65% with grade 1 diastolic dysfunction.  Atrial size was normal.  She was felt to be well compensated guarding her scleroderma with related mild interstitial lung disease.  Her echo Doppler study there was no evidence for pulmonary hypertension or RV strain.  Her PFTs showed stable spirometry and improved DLCO.  A high  resolution CT in October 2020 showed mild ILD slightly improved from previous assessment.  She was noted to have coronary calcification on CT imaging.  She was active without chest pain or exertional dyspnea and preferred avoiding further testing.  Presently she denies chest pain PND orthopnea.  She has been taking losartan 100 mg daily.  She has a prescription for furosemide but she has not required use and had not taken this recently.  Apparently she was still only taking Zetia 10 mg 2 days/week and Vascepa 1 g by mouth once a week.  She presents for evaluation..   Past Medical History:  Diagnosis Date  . Dyspnea    MYOVIEW, 12/29/2006 - post-stress EF 81%,no ECG changes, EKG negative for ischemia  . Hyperlipidemia   . Hypertension    2D ECHO, 12/24/2006 - EF 45-55%, normal  . Swelling of extremity    LEA VENOUS DUPLEX, 08/19/2011 - deep valvular insufficiency noted with right femoral and popliteal veins and left common femoral vein  . TIA (transient ischemic attack)    CAROTID DOPPLER, 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    History reviewed. No pertinent surgical history.  Current Medications: Outpatient Medications Prior to Visit  Medication Sig Dispense Refill  . aspirin 81 MG tablet Take 81 mg by mouth.    . Cholecalciferol (D3 MAXIMUM STRENGTH PO) Take 5,000 Units by mouth 2 (two) times a week.     . Coenzyme Q10 200 MG capsule Take 200 mg by mouth 2 (two) times a week.    . diphenhydrAMINE (BENADRYL) 25 mg capsule Take 25 mg by mouth every 6 (six) hours as needed.    . ezetimibe (ZETIA) 10 MG tablet Take 10 mg by mouth 2 (two) times a week.    . furosemide (LASIX) 20 MG tablet Take 1 tablet (20 mg total) by mouth daily as needed for fluid or edema. 90 tablet 3  . loratadine (CLARITIN) 10 MG tablet Take 10 mg by mouth daily as needed for allergies.    Marland Kitchen losartan (COZAAR) 100 MG tablet TAKE 1 TABLET BY MOUTH EVERY DAY 60 tablet 3  . Menaquinone-7  (VITAMIN K2 PO) Take 50 mcg by mouth 2 (two) times a week.     . naproxen sodium (ALEVE) 220 MG tablet Take 220 mg by mouth as needed.     . potassium chloride SA (KLOR-CON M20) 20 MEQ tablet Take 1 tablet (20 mEq total) by mouth daily as needed. When you take Furosemide (Lasix). 90 tablet 3  . valACYclovir (VALTREX) 1000 MG tablet Take 1 tablet (1,000 mg total) by mouth daily. 90 tablet 0  . Vitamin E 45 MG CAPS 400 Units daily.     Vanessa Kick Ethyl 1 g CAPS Take 1 g by mouth 2 (two) times a week.      No facility-administered medications prior  to visit.     Allergies:   Codeine, Cymbalta [duloxetine hcl], Lyrica [pregabalin], Penicillins, Sulfa antibiotics, Zetia [ezetimibe], and Zocor [simvastatin]   Social History   Socioeconomic History  . Marital status: Married    Spouse name: Not on file  . Number of children: Not on file  . Years of education: Not on file  . Highest education level: Not on file  Occupational History  . Not on file  Tobacco Use  . Smoking status: Never Smoker  . Smokeless tobacco: Never Used  Substance and Sexual Activity  . Alcohol use: Yes    Alcohol/week: 6.0 standard drinks    Types: 6 drink(s) per week  . Drug use: No  . Sexual activity: Not on file  Other Topics Concern  . Not on file  Social History Narrative  . Not on file   Social Determinants of Health   Financial Resource Strain:   . Difficulty of Paying Living Expenses: Not on file  Food Insecurity:   . Worried About Charity fundraiser in the Last Year: Not on file  . Ran Out of Food in the Last Year: Not on file  Transportation Needs:   . Lack of Transportation (Medical): Not on file  . Lack of Transportation (Non-Medical): Not on file  Physical Activity:   . Days of Exercise per Week: Not on file  . Minutes of Exercise per Session: Not on file  Stress:   . Feeling of Stress : Not on file  Social Connections:   . Frequency of Communication with Friends and Family: Not on file   . Frequency of Social Gatherings with Friends and Family: Not on file  . Attends Religious Services: Not on file  . Active Member of Clubs or Organizations: Not on file  . Attends Archivist Meetings: Not on file  . Marital Status: Not on file     Family History:  The patient's family history includes Angina (age of onset: 86) in her maternal grandfather; Atrial fibrillation (age of onset: 108) in her mother; Cirrhosis (age of onset: 7) in her father; Heart attack (age of onset: 63) in her maternal grandfather; Hyperlipidemia in her father; Hypertension in her mother; Hypertension (age of onset: 57) in her maternal grandmother; Kidney failure (age of onset: 70) in her father; Lung disease in her paternal grandmother; Stroke (age of onset: 66) in her maternal grandmother.   ROS General: Negative; No fevers, chills, or night sweats;  HEENT: Negative; No changes in vision or hearing, sinus congestion, difficulty swallowing Pulmonary: mild interstitial lung disease Cardiovascular: No chest pain, rare palpitations.  Mixed hyperlipidemia GI: Negative; No nausea, vomiting, diarrhea, or abdominal pain GU: Negative; No dysuria, hematuria, or difficulty voiding Musculoskeletal: Negative; no myalgias, joint pain, or weakness Rheumatologic: scleroderma Hematologic/Oncology: Negative; no easy bruising, bleeding Endocrine: Negative; no heat/cold intolerance; no diabetes Neuro: History of TIA Skin: Negative; No rashes or skin lesions Psychiatric: Negative; No behavioral problems, depression Sleep: Positive for snoring, no daytime sleepiness, hypersomnolence, bruxism, restless legs, hypnogognic hallucinations, no cataplexy Other comprehensive 14 point system review is negative.   PHYSICAL EXAM:   VS:  BP (!) 142/77   Pulse 75   Temp (!) 97.3 F (36.3 C)   Ht '5\' 7"'$  (1.702 m)   Wt 180 lb (81.6 kg)   SpO2 96%   BMI 28.19 kg/m     Repeat blood pressure by me 136/74  Wt Readings from  Last 3 Encounters:  03/08/19 180 lb (81.6  kg)  01/17/19 181 lb 9.6 oz (82.4 kg)  12/30/18 179 lb 3.2 oz (81.3 kg)    General: Alert, oriented, no distress.  Skin: normal turgor, no rashes, warm and dry HEENT: Normocephalic, atraumatic. Pupils equal round and reactive to light; sclera anicteric; extraocular muscles intact;  Nose without nasal septal hypertrophy Mouth/Parynx benign; Mallinpatti scale 3 Neck: No JVD, no carotid bruits; normal carotid upstroke Lungs: clear to ausculatation and percussion; no wheezing or rales Chest wall: without tenderness to palpitation Heart: PMI not displaced, RRR, s1 s2 normal, 1/6 systolic murmur, no diastolic murmur, no rubs, gallops, thrills, or heaves Abdomen: soft, nontender; no hepatosplenomehaly, BS+; abdominal aorta nontender and not dilated by palpation. Back: no CVA tenderness Pulses 2+ Musculoskeletal: full range of motion, normal strength, no joint deformities Extremities: Both great toes to have right blood under the nail.  No clubbing cyanosis or edema, Homan's sign negative  Neurologic: grossly nonfocal; Cranial nerves grossly wnl Psychologic: Normal mood and affect   Studies/Labs Reviewed:   EKG:  EKG is ordered today.  ECG (independently read by me): Normal sinus rhythm at 75 bpm.  Poor anterior R wave progression V1 through V3.  No ectopy.  Normal intervals.  October 2020 ECG (independently read by me): NSR at 74; normal interval, no extopy  December 2019 ECG (independently read by me): Normal sinus rhythm at 82 bpm.  No ectopy.  Normal intervals.  Recent Labs: BMP Latest Ref Rng & Units 12/27/2018 02/24/2018 11/24/2017  Glucose 65 - 99 mg/dL 88 102(H) 88  BUN 7 - 25 mg/dL '18 17 19  '$ Creatinine 0.60 - 0.93 mg/dL 0.91 0.84 0.92  BUN/Creat Ratio 6 - 22 (calc) NOT APPLICABLE 20 NOT APPLICABLE  Sodium 883 - 146 mmol/L 138 140 140  Potassium 3.5 - 5.3 mmol/L 4.3 4.4 4.3  Chloride 98 - 110 mmol/L 105 104 106  CO2 20 - 32 mmol/L '24 22 26   '$ Calcium 8.6 - 10.4 mg/dL 10.2 10.1 10.2     Hepatic Function Latest Ref Rng & Units 12/27/2018 02/24/2018 11/24/2017  Total Protein 6.1 - 8.1 g/dL 6.3 6.6 6.8  Albumin 3.5 - 4.8 g/dL - 4.5 -  AST 10 - 35 U/L '16 21 17  '$ ALT 6 - 29 U/L 20 35(H) 20  Alk Phosphatase 39 - 117 IU/L - 71 -  Total Bilirubin 0.2 - 1.2 mg/dL 0.9 1.3(H) 1.1    CBC Latest Ref Rng & Units 12/09/2018 11/24/2017 01/20/2017  WBC 3.4 - 10.8 x10E3/uL 6.3 6.4 6.9  Hemoglobin 11.1 - 15.9 g/dL 14.1 14.8 14.7  Hematocrit 34.0 - 46.6 % 40.6 42.7 42.3  Platelets 150 - 450 x10E3/uL 283 307 297   Lab Results  Component Value Date   MCV 90 12/09/2018   MCV 88.8 11/24/2017   MCV 88.9 01/20/2017   Lab Results  Component Value Date   TSH 3.380 12/09/2018   No results found for: HGBA1C   BNP No results found for: BNP  ProBNP No results found for: PROBNP   Lipid Panel     Component Value Date/Time   CHOL 167 12/09/2018 1020   TRIG 185 (H) 12/09/2018 1020   HDL 41 12/09/2018 1020   CHOLHDL 4.1 12/09/2018 1020   CHOLHDL 4 12/24/2017 1121   VLDL 42.2 (H) 12/24/2017 1121   LDLCALC 94 12/09/2018 1020   LDLDIRECT 94.0 12/24/2017 1121     RADIOLOGY: No results found.   Additional studies/ records that were reviewed today include:  I reviewed the patient's  valuations from Dr. Haroldine Laws and Chase Caller.  I reviewed her prior echo Doppler studies, chest CT and laboratory.  Most recent December 30, 2018 echo was reviewed  ASSESSMENT:    1. Essential hypertension   2. Hyperlipidemia with target LDL less than 70   3. Diastolic dysfunction   4. Interstitial lung disease ([place])   5. Scleroderma ([place])   6. Mixed hyperlipidemia   7. Aortic atherosclerosis ([place])   8. Coronary artery calcification seen on CAT scan   9. Medication management     PLAN:  Ms. Jaide Hillenburg is a 74 year old female who has a history of hypertension, significant mixed hyperlipidemia with prior triglycerides at 669 in 2018, remote history of  TIA, lower extremity varicose veins, as well as a biopsy-proven history of scleroderma.  She has not been found to have pulmonary hypertension.  She has mild interstitial lung disease which she has been seen by Dr. Chase Caller.  She was found to have aortic atherosclerosis and multivessel plaque on a high-resolution chest CT.  I again  reviewed her coronary CTA with her in detail today which revealed a low calcium score at 20 and mild plaque less than 30% in the LAD circumflex and RCA.  Blood pressure today is minimally increased on repeat by me was 136/74.  I reviewed her most recent laboratory which is shown an LDL cholesterol of 94 and  slight increase in triglycerides at 185.  With evidence for aortic atherosclerosis and coronary calcification I have recommended more aggressive management.  She is no longer on Livalo but she seemed to tolerate that well in the past.  I have recommended resumption of Livalo 2 mg and she will try resuming this at least 2 days/week.  I have recommended she try increasing her Zetia if not to 10 mg daily then at least to 10 mg every other day.  He has only been taking Vascepa 1 capsule a week.  I have suggested 1 capsule daily if at all possible.  She denies any anginal symptoms.  She does note mild shortness of breath with uphill walking.  She has grade 1 diastolic dysfunction.  Medication Adjustments/Labs and Tests Ordered: Current medicines are reviewed at length with the patient today.  Concerns regarding medicines are outlined above.  Medication changes, Labs and Tests ordered today are listed in the Patient Instructions below. Patient Instructions  Medication Instructions:  TAKE Livalo 2 times a week  *If you need a refill on your cardiac medications before your next appointment, please call your pharmacy*  Lab Work: Fasting Lipid and CMP in 4 Months  If you have labs (blood work) drawn today and your tests are completely normal, you will receive your results only  by: Marland Kitchen MyChart Message (if you have MyChart) OR . A paper copy in the mail If you have any lab test that is abnormal or we need to change your treatment, we will call you to review the results.  Testing/Procedures: None Ordered  Follow-Up: At [facility], you and your health needs are our priority.  As part of our continuing mission to provide you with exceptional heart care, we have created designated Provider Care Teams.  These Care Teams include your primary Cardiologist (physician) and Advanced Practice Providers (APPs -  Physician Assistants and Nurse Practitioners) who all work together to provide you with the care you need, when you need it.  Your next appointment:   6 month(s)  The format for your next appointment:   In Person  Provider:   Shelva Majestic, MD     Time spent: 25 minutes Signed, Shelva Majestic, MD  03/10/2019 5:50 PM    [facility] [address] [place], [place], [place]  [address] Phone: [phone]

## 2019-03-08 NOTE — Patient Instructions (Signed)
Medication Instructions:  TAKE Livalo 2 times a week  *If you need a refill on your cardiac medications before your next appointment, please call your pharmacy*  Lab Work: Fasting Lipid and CMP in 4 Months  If you have labs (blood work) drawn today and your tests are completely normal, you will receive your results only by: Marland Kitchen MyChart Message (if you have MyChart) OR . A paper copy in the mail If you have any lab test that is abnormal or we need to change your treatment, we will call you to review the results.  Testing/Procedures: None Ordered  Follow-Up: At [facility], you and your health needs are our priority.  As part of our continuing mission to provide you with exceptional heart care, we have created designated Provider Care Teams.  These Care Teams include your primary Cardiologist (physician) and Advanced Practice Providers (APPs -  Physician Assistants and Nurse Practitioners) who all work together to provide you with the care you need, when you need it.  Your next appointment:   6 month(s)  The format for your next appointment:   In Person  Provider:   Shelva Majestic, MD

## 2019-03-10 ENCOUNTER — Encounter: Payer: Self-pay | Admitting: Cardiovascular Disease

## 2019-03-10 ENCOUNTER — Other Ambulatory Visit: Payer: Self-pay | Admitting: Cardiovascular Disease

## 2019-03-13 DIAGNOSIS — Z23 Encounter for immunization: Secondary | ICD-10-CM | POA: Diagnosis not present

## 2019-03-18 MED ORDER — PITAVASTATIN CALCIUM 2 MG PO TABS: 1.0000 | ORAL_TABLET | ORAL | 3 refills | Status: DC

## 2019-03-22 ENCOUNTER — Ambulatory Visit: Payer: Medicare Other

## 2019-03-22 ENCOUNTER — Telehealth: Payer: Self-pay

## 2019-03-22 NOTE — Telephone Encounter (Signed)
Prior auth for Savannah [name] has been approved from 02/15/2019- 03/16/2020

## 2019-03-23 ENCOUNTER — Encounter: Payer: Self-pay | Admitting: Rheumatology

## 2019-03-23 ENCOUNTER — Telehealth: Payer: Self-pay | Admitting: Rheumatology

## 2019-03-23 NOTE — Telephone Encounter (Signed)
Dr. Estanislado Pandy reviewed picture sent by the patient. Per Dr. Estanislado Pandy, she recommends the patient see her dermatologist urgently. I advised patient and she will call the dermatologist. Patient will call our office back to schedule a follow up appointment.

## 2019-03-23 NOTE — Telephone Encounter (Signed)
Please schedule an in-office visit for further evaluation.  I do not see an upcoming appointment scheduled in Epic.

## 2019-03-23 NOTE — Telephone Encounter (Signed)
Patient states both great toes are black under the nails. Patient denies discoloration in the toes, they are not cold to touch. Patient does report numbness intermittently. Patient states Savannah [name] will also upload a picture to [place].   Patient has scleroderma and raynauds.

## 2019-03-23 NOTE — Telephone Encounter (Signed)
Patient called stating there is "black under her toenails" of both her big toes.  Patient states she thinks it might be Raynaud's.  Patient states she tried to upload a picture on her mychart, but was not successful.  Patient is requesting a return call.

## 2019-03-31 DIAGNOSIS — Z85828 Personal history of other malignant neoplasm of skin: Secondary | ICD-10-CM | POA: Diagnosis not present

## 2019-03-31 DIAGNOSIS — D692 Other nonthrombocytopenic purpura: Secondary | ICD-10-CM | POA: Diagnosis not present

## 2019-04-04 ENCOUNTER — Telehealth: Payer: Self-pay

## 2019-04-04 NOTE — Telephone Encounter (Signed)
[facility] has approved Livalo 2mg  tablet. From 02/15/2019 to 03/16/2020

## 2019-04-05 DIAGNOSIS — Z23 Encounter for immunization: Secondary | ICD-10-CM | POA: Diagnosis not present

## 2019-04-17 ENCOUNTER — Other Ambulatory Visit: Payer: Self-pay | Admitting: Internal Medicine

## 2019-04-17 DIAGNOSIS — A6009 Herpesviral infection of other urogenital tract: Secondary | ICD-10-CM

## 2019-05-22 ENCOUNTER — Encounter: Payer: Self-pay | Admitting: Internal Medicine

## 2019-07-03 ENCOUNTER — Other Ambulatory Visit: Payer: Self-pay | Admitting: Cardiovascular Disease

## 2019-07-11 ENCOUNTER — Other Ambulatory Visit: Payer: Self-pay | Admitting: Cardiovascular Disease

## 2019-07-14 ENCOUNTER — Other Ambulatory Visit: Payer: Self-pay

## 2019-10-18 DIAGNOSIS — Z23 Encounter for immunization: Secondary | ICD-10-CM | POA: Diagnosis not present

## 2019-12-19 ENCOUNTER — Other Ambulatory Visit (HOSPITAL_COMMUNITY): Payer: Self-pay | Admitting: *Deleted

## 2019-12-19 DIAGNOSIS — I5032 Chronic diastolic (congestive) heart failure: Secondary | ICD-10-CM

## 2019-12-19 DIAGNOSIS — I38 Endocarditis, valve unspecified: Secondary | ICD-10-CM

## 2019-12-24 ENCOUNTER — Other Ambulatory Visit: Payer: Self-pay | Admitting: Cardiovascular Disease

## 2020-01-06 ENCOUNTER — Ambulatory Visit: Payer: Medicare Other | Admitting: Cardiovascular Disease

## 2020-01-08 ENCOUNTER — Other Ambulatory Visit: Payer: Self-pay | Admitting: Cardiovascular Disease

## 2020-01-09 ENCOUNTER — Encounter: Payer: Self-pay | Admitting: Cardiovascular Disease

## 2020-01-09 ENCOUNTER — Ambulatory Visit (INDEPENDENT_AMBULATORY_CARE_PROVIDER_SITE_OTHER): Payer: Medicare Other | Admitting: Cardiovascular Disease

## 2020-01-09 ENCOUNTER — Other Ambulatory Visit: Payer: Self-pay

## 2020-01-09 DIAGNOSIS — Z79899 Other long term (current) drug therapy: Secondary | ICD-10-CM | POA: Diagnosis not present

## 2020-01-09 DIAGNOSIS — I2510 Atherosclerotic heart disease of native coronary artery without angina pectoris: Secondary | ICD-10-CM | POA: Diagnosis not present

## 2020-01-09 DIAGNOSIS — J849 Interstitial pulmonary disease, unspecified: Secondary | ICD-10-CM

## 2020-01-09 DIAGNOSIS — E782 Mixed hyperlipidemia: Secondary | ICD-10-CM | POA: Diagnosis not present

## 2020-01-09 DIAGNOSIS — M349 Systemic sclerosis, unspecified: Secondary | ICD-10-CM | POA: Diagnosis not present

## 2020-01-09 DIAGNOSIS — E785 Hyperlipidemia, unspecified: Secondary | ICD-10-CM | POA: Diagnosis not present

## 2020-01-09 DIAGNOSIS — R7989 Other specified abnormal findings of blood chemistry: Secondary | ICD-10-CM | POA: Diagnosis not present

## 2020-01-09 DIAGNOSIS — I5189 Other ill-defined heart diseases: Secondary | ICD-10-CM

## 2020-01-09 DIAGNOSIS — I10 Essential (primary) hypertension: Secondary | ICD-10-CM

## 2020-01-09 DIAGNOSIS — I5032 Chronic diastolic (congestive) heart failure: Secondary | ICD-10-CM | POA: Diagnosis not present

## 2020-01-09 DIAGNOSIS — I38 Endocarditis, valve unspecified: Secondary | ICD-10-CM | POA: Diagnosis not present

## 2020-01-09 LAB — COMPREHENSIVE METABOLIC PANEL
ALT: 28 IU/L (ref 0–32)
AST: 20 IU/L (ref 0–40)
Albumin/Globulin Ratio: 2.4 — ABNORMAL HIGH (ref 1.2–2.2)
Albumin: 4.5 g/dL (ref 3.7–4.7)
Alkaline Phosphatase: 68 IU/L (ref 44–121)
BUN/Creatinine Ratio: 17 (ref 12–28)
BUN: 13 mg/dL (ref 8–27)
Bilirubin Total: 1.5 mg/dL — ABNORMAL HIGH (ref 0.0–1.2)
CO2: 25 mmol/L (ref 20–29)
Calcium: 10.1 mg/dL (ref 8.7–10.3)
Chloride: 107 mmol/L — ABNORMAL HIGH (ref 96–106)
Creatinine, Ser: 0.78 mg/dL (ref 0.57–1.00)
GFR calc Af Amer: 86 mL/min/{1.73_m2} (ref >59)
GFR calc non Af Amer: 75 mL/min/{1.73_m2} (ref >59)
Globulin, Total: 1.9 g/dL (ref 1.5–4.5)
Glucose: 97 mg/dL (ref 65–99)
Potassium: 4.4 mmol/L (ref 3.5–5.2)
Sodium: 142 mmol/L (ref 134–144)
Total Protein: 6.4 g/dL (ref 6.0–8.5)

## 2020-01-09 LAB — CBC
Hematocrit: 39.6 % (ref 34.0–46.6)
Hemoglobin: 13.9 g/dL (ref 11.1–15.9)
MCH: 31.7 pg (ref 26.6–33.0)
MCHC: 35.1 g/dL (ref 31.5–35.7)
MCV: 90 fL (ref 79–97)
Platelets: 259 10*3/uL (ref 150–450)
RBC: 4.38 x10E6/uL (ref 3.77–5.28)
RDW: 12.4 % (ref 11.7–15.4)
WBC: 5.5 10*3/uL (ref 3.4–10.8)

## 2020-01-09 LAB — TSH: TSH: 4.06 u[IU]/mL (ref 0.450–4.500)

## 2020-01-09 LAB — LIPID PANEL
Chol/HDL Ratio: 3 ratio (ref 0.0–4.4)
Cholesterol, Total: 146 mg/dL (ref 100–199)
HDL: 48 mg/dL (ref >39)
LDL Chol Calc (NIH): 65 mg/dL (ref 0–99)
Triglycerides: 200 mg/dL — ABNORMAL HIGH (ref 0–149)
VLDL Cholesterol Cal: 33 mg/dL (ref 5–40)

## 2020-01-09 MED ORDER — POTASSIUM CHLORIDE CRYS ER 20 MEQ PO TBCR: 20.0000 meq | EXTENDED_RELEASE_TABLET | Freq: Every day | ORAL | 3 refills | Status: AC | PRN

## 2020-01-09 MED ORDER — FUROSEMIDE 20 MG PO TABS: ORAL_TABLET | ORAL | 3 refills | Status: DC

## 2020-01-09 MED ORDER — METOPROLOL SUCCINATE ER 25 MG PO TB24: 25.0000 mg | ORAL_TABLET | Freq: Every day | ORAL | 3 refills | Status: DC

## 2020-01-09 NOTE — Progress Notes (Signed)
Cardiology Office Note    Date:  01/09/2020   ID:  Brooklen, Runquist 1945/07/08, MRN [id number]  PCP:  Janith Lima, MD  Cardiologist:  Shelva Majestic, MD   No chief complaint on file.  F/U cardiology evaluation   History of Present Illness:  AMIERA HERZBERG is a 74 y.o. female who presents for an 10 month f/u cardiology evaluation.  Ms. Saltsman has a history of scleroderma, TIA, hypertension, mixed hyperlipidemia, as well as lower extremity varicose veins.  She has biopsy-proven scleroderma biopsy on her hand around 2005.  She has had issues with shortness of breath in the past.  In 2008, her ejection fraction was 45 to 55% by echocardiography and a Myoview study November 2008 was negative for ischemia with hyperdynamic post stress ejection fraction.  She has been seen by Dr. Haroldine Laws.  On CT imaging she was found to have mild interstitial lung disease for which she subsequently was referred to Dr. Chase Caller.  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  On that CT she was also noted to have aortic atherosclerosis in addition to left main and three-vessel coronary artery disease.  However this was not gated and assessment of obstructive stenoses could not be made.  On December 23, 2017 she underwent a follow-up echo Doppler study which showed an EF of 55 to 60%.  LV strain was normal at -17.4%.  She did not have regional wall motion abnormalities.  She had mild calcification of her aortic leaflets.  There is trivial MR and PR.  PA pressures were not increased.  She last saw Dr. Haroldine Laws in November 2019 and presents now for cardiology care and risk management.  She has a history of significant mixed hyperlipidemia and is now on the Vascepa and Livalo, apparently has only been taking Livalo maybe once or at most twice per week.  Admits  to rare to occasional palpitations.  She denies recent chest tightness.  She denies presyncope or syncope.  I have cared for her husband.  She has a history of snoring but states her sleep is restorative.  She typically goes to [place] from mid January through June.  When I saw her in December 2019 I recommended that she undergo coronary CT angiography to further elucidate her coronary anatomy after she was found to have plaque on her chest CT.  This was performed on March 01, 2018.  Her calcium score was 20 which was only 32 percentile for age and sex.  There was mild plaque noted with less than 30% stenosis in the proximal and mid LAD, circumflex and right coronary arteries.  She had a normal aortic root at 3.3 cm.  I also recommended Vascepa in addition to Livalo for her mixed hyperlipidemia and added Zetia for an additional 20% lowering.  She went to [place] from January until October and returned last week.  Apparently she has only been taking Zetia 2 times per week and is only been taking Vascepa 2 times per week.  She has not had relaboratory.  She is no longer taking Livalo and apparently stopped taking it.  She denied chest pain PND orthopnea.  She will again begin to [place] in early 2021.    Since I last saw her, she had laboratory in October 2020.  Total cholesterol was 167, triglycerides 185, HDL 41, LDL 94.  At that time, I had suggested the patient increase Zetia to 10 mg daily and also increase Vascepa to daily and she had only been taking these medicines 2 days/week.  She was evaluated by Dr. Haroldine Laws in November 2020.  An echo Doppler study December 30, 2018 showed EF 60 to 65% with grade 1 diastolic dysfunction.  Atrial size was normal.  She was felt to be well compensated guarding her scleroderma with related mild interstitial lung disease.  Her echo Doppler study there was no evidence for pulmonary hypertension or RV strain.  Her PFTs showed stable spirometry and improved DLCO.  A high  resolution CT in October 2020 showed mild ILD slightly improved from previous assessment.  She was noted to have coronary calcification on CT imaging.  She was active without chest pain or exertional dyspnea and preferred avoiding further testing.  I last saw her in January 2021 prior to her returning back to [place].  At that time she denied any chest pain, PND orthopnea.  She had  been taking losartan 100 mg daily.  She has a prescription for furosemide but she has not required use and had not taken this recently.  Apparently she was still only taking Zetia 10 mg 2 days/week and Vascepa 1 g by mouth once a week.  During that evaluation, I again reviewed her coronary CTA which revealed a calcium score of 20 and mild plaque in the LAD, circumflex and RCA.  At that time she was no longer taking Livalo but had tolerated that well in the past and I recommended resumption of Livalo 2 mg and to try initiating this at least 2 days/week.  I also recommended she try increasing Zetia if not to 10 mg daily then at least every other day.  She had recently returned from [place] with her husband.  She states her blood pressure has been somewhat labile with systolic pressure ranging from 1 20-1 60.  On one occasion it had reached 172 mmHg.  She denies chest pain or significant shortness of breath.  She is scheduled to undergo pulmonary function studies on November 30 as well as a follow-up echo Doppler study on December 6.  She is also followed by Dr. Haroldine Laws for her scleroderma study and has a follow-up office visit with Dr. Chase Caller regarding her interstitial lung disease.  She presents for follow-up evaluation.   Past Medical History:  Diagnosis Date   Dyspnea    MYOVIEW, 12/29/2006 - post-stress EF 81%,no ECG changes, EKG negative for ischemia   Hyperlipidemia    Hypertension    2D ECHO, 12/24/2006 - EF 45-55%, normal   Swelling of extremity    LEA VENOUS DUPLEX, 08/19/2011 - deep valvular insufficiency  noted with right femoral and popliteal veins and left common femoral vein   TIA (transient ischemic attack)    CAROTID DOPPLER, 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    No past surgical history on file.  Current Medications: Outpatient Medications Prior to Visit  Medication Sig Dispense Refill   aspirin 81 MG tablet Take 81 mg by mouth.     Cholecalciferol (D3 MAXIMUM STRENGTH PO) Take 5,000 Units by mouth 2 (two) times a week.      Coenzyme Q10 200 MG capsule Take 200 mg by mouth 2 (two) times a week.     diphenhydrAMINE (BENADRYL) 25 mg capsule Take 25 mg by mouth every 6 (six) hours as needed.     ezetimibe (ZETIA) 10 MG tablet  Take 10 mg by mouth daily. Pt takes 10 mg by mouth 2 times a week.     icosapent Ethyl (VASCEPA) 1 g capsule Take 1 capsule (1 g total) by mouth 2 (two) times a week. 36 capsule 2   LIVALO 2 MG TABS TAKE 1 TABLET BY MOUTH TWICE A WEEK 90 tablet 3   loratadine (CLARITIN) 10 MG tablet Take 10 mg by mouth daily as needed for allergies.     Menaquinone-7 (VITAMIN K2 PO) Take 50 mcg by mouth 2 (two) times a week.      naproxen sodium (ALEVE) 220 MG tablet Take 220 mg by mouth as needed.      valACYclovir (VALTREX) 1000 MG tablet TAKE 1 TABLET BY MOUTH EVERY DAY 30 tablet 2   Vitamin E 45 MG CAPS 400 Units daily.      furosemide (LASIX) 20 MG tablet Take 1 tablet (20 mg total) by mouth daily as needed for fluid or edema. 90 tablet 3   losartan (COZAAR) 100 MG tablet TAKE 1 TABLET BY MOUTH EVERY DAY 90 tablet 1   potassium chloride SA (KLOR-CON M20) 20 MEQ tablet Take 1 tablet (20 mEq total) by mouth daily as needed. When you take Furosemide (Lasix). 90 tablet 3   ezetimibe (ZETIA) 10 MG tablet Take 1 tablet (10 mg total) by mouth daily. 90 tablet 3   No facility-administered medications prior to visit.     Allergies:   Codeine, Cymbalta [duloxetine hcl], Lyrica [pregabalin], Penicillins, Sulfa antibiotics, Zetia  [ezetimibe], and Zocor [simvastatin]   Social History   Socioeconomic History   Marital status: Married    Spouse name: Not on file   Number of children: Not on file   Years of education: Not on file   Highest education level: Not on file  Occupational History   Not on file  Tobacco Use   Smoking status: Never Smoker   Smokeless tobacco: Never Used  Vaping Use   Vaping Use: Never used  Substance and Sexual Activity   Alcohol use: Yes    Alcohol/week: 6.0 standard drinks    Types: 6 drink(s) per week   Drug use: No   Sexual activity: Not on file  Other Topics Concern   Not on file  Social History Narrative   Not on file   Social Determinants of Health   Financial Resource Strain:    Difficulty of Paying Living Expenses: Not on file  Food Insecurity:    Worried About [employer] in the Last Year: Not on file   Ran Out of Food in the Last Year: Not on file  Transportation Needs:    Lack of Transportation (Medical): Not on file   Lack of Transportation (Non-Medical): Not on file  Physical Activity:    Days of Exercise per Week: Not on file   Minutes of Exercise per Session: Not on file  Stress:    Feeling of Stress : Not on file  Social Connections:    Frequency of Communication with Friends and Family: Not on file   Frequency of Social Gatherings with Friends and Family: Not on file   Attends Religious Services: Not on file   Active Member of Clubs or Organizations: Not on file   Attends Archivist Meetings: Not on file   Marital Status: Not on file     Family History:  The patient's family history includes Angina (age of onset: 59) in her maternal grandfather; Atrial fibrillation (age of onset:  50) in her mother; Cirrhosis (age of onset: 34) in her father; Heart attack (age of onset: 60) in her maternal grandfather; Hyperlipidemia in her father; Hypertension in her mother; Hypertension (age of onset: 10) in her maternal  grandmother; Kidney failure (age of onset: 64) in her father; Lung disease in her paternal grandmother; Stroke (age of onset: 21) in her maternal grandmother.   ROS General: Negative; No fevers, chills, or night sweats;  HEENT: Negative; No changes in vision or hearing, sinus congestion, difficulty swallowing Pulmonary: mild interstitial lung disease Cardiovascular: No chest pain, rare palpitations.  Mixed hyperlipidemia GI: Negative; No nausea, vomiting, diarrhea, or abdominal pain GU: Negative; No dysuria, hematuria, or difficulty voiding Musculoskeletal: Negative; no myalgias, joint pain, or weakness Rheumatologic: scleroderma Hematologic/Oncology: Negative; no easy bruising, bleeding Endocrine: Negative; no heat/cold intolerance; no diabetes Neuro: History of TIA Skin: Negative; No rashes or skin lesions Psychiatric: Negative; No behavioral problems, depression Sleep: Positive for snoring, no daytime sleepiness, hypersomnolence, bruxism, restless legs, hypnogognic hallucinations, no cataplexy Other comprehensive 14 point system review is negative.   PHYSICAL EXAM:   VS:  BP (!) 160/90    Pulse 78    Ht _0  (1.676 m)    Wt 187 lb 3.2 oz (84.9 kg)    BMI 30.21 kg/m     Repeat blood pressure by me 136/74  Wt Readings from Last 3 Encounters:  01/09/20 187 lb 3.2 oz (84.9 kg)  03/08/19 180 lb (81.6 kg)  01/17/19 181 lb 9.6 oz (82.4 kg)    General: Alert, oriented, no distress.  Skin: normal turgor, no rashes, warm and dry HEENT: Normocephalic, atraumatic. Pupils equal round and reactive to light; sclera anicteric; extraocular muscles intact;  Nose without nasal septal hypertrophy Mouth/Parynx benign; Mallinpatti scale 3 Neck: No JVD, no carotid bruits; normal carotid upstroke Lungs: clear to ausculatation and percussion; no wheezing or rales Chest wall: without tenderness to palpitation Heart: PMI not displaced, RRR, s1 s2 normal, 1/6 systolic murmur, no diastolic murmur, no  rubs, gallops, thrills, or heaves Abdomen: soft, nontender; no hepatosplenomehaly, BS+; abdominal aorta nontender and not dilated by palpation. Back: no CVA tenderness Pulses 2+ Musculoskeletal: full range of motion, normal strength, no joint deformities Extremities: no clubbing cyanosis or edema, Homan's sign negative  Neurologic: grossly nonfocal; Cranial nerves grossly wnl Psychologic: Normal mood and affect   Studies/Labs Reviewed:   EKG:  EKG is ordered today.  ECG (independently read by me): Normal sinus rhythm with mild sinus arrhythmia at 78 bpm.  Borderline first-degree block with a PR interval 206 ms;QTc interval 419 ms.  January 2021 ECG (independently read by me): Normal sinus rhythm at 75 bpm.  Poor anterior R wave progression V1 through V3.  No ectopy.  Normal intervals.  October 2020 ECG (independently read by me): NSR at 74; normal interval, no extopy  December 2019 ECG (independently read by me): Normal sinus rhythm at 82 bpm.  No ectopy.  Normal intervals.  Recent Labs: BMP Latest Ref Rng & Units 01/09/2020 12/27/2018 02/24/2018  Glucose 65 - 99 mg/dL 97 88 102(H)  BUN 8 - 27 mg/dL _1 Creatinine 0.57 - 1.00 mg/dL 0.78 0.91 0.84  BUN/Creat Ratio 12 - 28 17 NOT APPLICABLE 20  Sodium 578 - 144 mmol/L 142 138 140  Potassium 3.5 - 5.2 mmol/L 4.4 4.3 4.4  Chloride 96 - 106 mmol/L 107(H) 105 104  CO2 20 - 29 mmol/L _2 Calcium 8.7 - 10.3 mg/dL 10.1 10.2  10.1     Hepatic Function Latest Ref Rng & Units 01/09/2020 12/27/2018 02/24/2018  Total Protein 6.0 - 8.5 g/dL 6.4 6.3 6.6  Albumin 3.7 - 4.7 g/dL 4.5 - 4.5  AST 0 - 40 IU/L _0 ALT 0 - 32 IU/L 28 20 35(H)  Alk Phosphatase 44 - 121 IU/L 68 - 71  Total Bilirubin 0.0 - 1.2 mg/dL 1.5(H) 0.9 1.3(H)    CBC Latest Ref Rng & Units 01/09/2020 12/09/2018 11/24/2017  WBC 3.4 - 10.8 x10E3/uL 5.5 6.3 6.4  Hemoglobin 11.1 - 15.9 g/dL 13.9 14.1 14.8  Hematocrit 34.0 - 46.6 % 39.6 40.6 42.7  Platelets 150 - 450  x10E3/uL 259 283 307   Lab Results  Component Value Date   MCV 90 01/09/2020   MCV 90 12/09/2018   MCV 88.8 11/24/2017   Lab Results  Component Value Date   TSH 4.060 01/09/2020   No results found for: HGBA1C   BNP No results found for: BNP  ProBNP No results found for: PROBNP   Lipid Panel     Component Value Date/Time   CHOL 146 01/09/2020 1025   TRIG 200 (H) 01/09/2020 1025   HDL 48 01/09/2020 1025   CHOLHDL 3.0 01/09/2020 1025   CHOLHDL 4 12/24/2017 1121   VLDL 42.2 (H) 12/24/2017 1121   LDLCALC 65 01/09/2020 1025   LDLDIRECT 94.0 12/24/2017 1121     RADIOLOGY: No results found.   Additional studies/ records that were reviewed today include:  I reviewed the patient's evaluations from Dr. Haroldine Laws and Chase Caller.  I reviewed her prior echo Doppler studies, chest CT and laboratory.   ECHO 12/30/2018 IMPRESSIONS  1. Left ventricular ejection fraction, by visual estimation, is 60 to  65%. The left ventricle has normal function. There is no left ventricular  hypertrophy.  2. Left ventricular diastolic parameters are consistent with Grade I  diastolic dysfunction (impaired relaxation).  3. Global right ventricle has normal systolic function.The right  ventricular size is normal. No increase in right ventricular wall  thickness.  4. Left atrial size was normal.  5. Right atrial size was normal.  6. The mitral valve is normal in structure. Trace mitral valve  regurgitation.  7. The tricuspid valve is normal in structure. Tricuspid valve  regurgitation is trivial.  8. The aortic valve is normal in structure. Aortic valve regurgitation is  not visualized.  9. The pulmonic valve was grossly normal. Pulmonic valve regurgitation is  not visualized.  10. The inferior vena cava is normal in size with greater than 50%  respiratory variability, suggesting right atrial pressure of 3 mmHg.   ASSESSMENT:    1. Essential hypertension   2. Coronary artery  calcification seen on CAT scan   3. Diastolic dysfunction   4. Mixed hyperlipidemia   5. Medication management   6. Scleroderma ([place])   7. Interstitial lung disease [facility])     PLAN:  Ms. Amil Moseman is a 74 year-old female who has a history of hypertension, significant mixed hyperlipidemia with prior triglycerides at 669 in 2018, remote history of TIA, lower extremity varicose veins, as well as a biopsy-proven history of scleroderma.  She has not been found to have pulmonary hypertension.  She has mild interstitial lung disease which she has been seen by Dr. Chase Caller.  She has aortic atherosclerosis and multivessel plaque on a high-resolution chest CT.   Calcium score was 20 and there was evidence for mild plaque less than 30% in the LAD,  circumflex, and RCA.  When I last saw her, she was no longer taking her Livalo and was not taking Zetia on a daily basis.  Her LDL cholesterol in October 2020 was 94.  At that time I recommended resumption of Livalo 2 mg and she now continues to take this 2 days/week.  I also recommended she take Zetia on an increased daily regimen if at all possible.  She has not had recent follow-up laboratory and I have suggested a comprehensive metabolic panel, CBC, TSH and fasting lipid studies to be obtained.  Her blood pressure today is elevated and on repeat by me was 156/88.  Her resting pulse is 78.  She has been on losartan 100 mg daily and has been taking furosemide 40 mg on a as needed basis.  I have recommended the addition of low-dose metoprolol succinate 25 mg daily which should help her blood pressure and heart rate.  She is participating in a scleroderma study with Dr. Haroldine Laws.  She will be having follow-up pulmonary function studies with follow-up office visit with Dr. Chase Caller in December and she will be undergoing follow-up echo Doppler study as well.  She typically goes to [place] for the winter months and off in the summer and I will see her upon her  return.   Medication Adjustments/Labs and Tests Ordered: Current medicines are reviewed at length with the patient today.  Concerns regarding medicines are outlined above.  Medication changes, Labs and Tests ordered today are listed in the Patient Instructions below. Patient Instructions  Medication Instructions:   START TAKING METOPROLOL SUCCINATE ( TOPROL XL ) 25 MG ONE TABLET DAILY   CHANGE  MAY TAKE 20 MG TO 40 MG OF LASIX  AS NEEDED FOR SWELLING  ALONG WITH POTASSIUM  *If you need a refill on your cardiac medications before your next appointment, please call your pharmacy*   Lab Work: FASTING TODAY  CBC CMP LIPID TSH  If you have labs (blood work) drawn today and your tests are completely normal, you will receive your results only by:  [place] (if you have MyChart) OR  A paper copy in the mail If you have any lab test that is abnormal or we need to change your treatment, we will call you to review the results.   Testing/Procedures:  NOT NEEDED  Follow-Up: At [facility], you and your health needs are our priority.  As part of our continuing mission to provide you with exceptional heart care, we have created designated Provider Care Teams.  These Care Teams include your primary Cardiologist (physician) and Advanced Practice Providers (APPs -  Physician Assistants and Nurse Practitioners) who all work together to provide you with the care you need, when you need it.     Your next appointment:   11 month(s)  The format for your next appointment:   In Person  Provider:   Shelva Majestic, MD     Time spent: 25 minutes Signed, Shelva Majestic, MD  01/09/2020 6:56 PM    [facility] [address], [place], [place], [place]  [address] Phone: [phone]

## 2020-01-09 NOTE — Patient Instructions (Addendum)
Medication Instructions:   START TAKING METOPROLOL SUCCINATE ( TOPROL XL ) 25 MG ONE TABLET DAILY   CHANGE  MAY TAKE 20 MG TO 40 MG OF LASIX  AS NEEDED FOR SWELLING  ALONG WITH POTASSIUM  *If you need a refill on your cardiac medications before your next appointment, please call your pharmacy*   Lab Work: FASTING TODAY  CBC CMP LIPID TSH  If you have labs (blood work) drawn today and your tests are completely normal, you will receive your results only by: Marland Kitchen MyChart Message (if you have MyChart) OR . A paper copy in the mail If you have any lab test that is abnormal or we need to change your treatment, we will call you to review the results.   Testing/Procedures:  NOT NEEDED  Follow-Up: At [facility], you and your health needs are our priority.  As part of our continuing mission to provide you with exceptional heart care, we have created designated Provider Care Teams.  These Care Teams include your primary Cardiologist (physician) and Advanced Practice Providers (APPs -  Physician Assistants and Nurse Practitioners) who all work together to provide you with the care you need, when you need it.     Your next appointment:   11 month(s)  The format for your next appointment:   In Person  Provider:   Shelva Majestic, MD

## 2020-01-13 ENCOUNTER — Other Ambulatory Visit: Payer: Self-pay | Admitting: Cardiovascular Disease

## 2020-01-16 NOTE — Progress Notes (Signed)
Office Visit Note  Patient: Savannah [name]             Date of Birth: 1945-06-11           MRN: [id number]             PCP: Janith Lima, MD Referring: Janith Lima, MD Visit Date: 01/26/2020 Occupation: @GUAROCC @  Subjective:  Osteoarthritis (Left hip pain, right shoulder and collar bone pain, no injury)   History of Present Illness: Savannah [name] is a 74 y.o. female history of scleroderma and osteoarthritis.  She returns today after her last visit on November 23, 2018.  She has been living in [place] and came back to [place] for the holidays.  She has been experiencing pain and discomfort in her right shoulder for the last 2 months.  She states she is hard for her to carry her pocketbook on the right side.  Left hip has been also bothering for the last 2 months.  He continues to have some discomfort and possible swelling in her knee joints.  She has some stiffness and discomfort in her hands off and on.  She saw Dr. Chase Caller this week.  She states that the PFTs declined.  She will be getting high-resolution CT for evaluation.  Her cardiology visit went okay she states metoprolol was added for hypertension.  Activities of Daily Living:  Patient reports morning stiffness for 15 minutes.   Patient Denies nocturnal pain.  Difficulty dressing/grooming: Reports Difficulty climbing stairs: Reports Difficulty getting out of chair: Reports Difficulty using hands for taps, buttons, cutlery, and/or writing: Denies  Review of Systems  Constitutional: Positive for fatigue.  HENT: Positive for mouth dryness.   Eyes: Positive for dryness.  Respiratory: Positive for shortness of breath.   Cardiovascular: Positive for swelling in legs/feet.  Gastrointestinal: Negative for constipation.  Endocrine: Positive for cold intolerance, heat intolerance, excessive thirst and increased urination.  Genitourinary: Negative for difficulty urinating.  Musculoskeletal: Positive for arthralgias, gait problem,  joint pain, joint swelling, muscle weakness, morning stiffness and muscle tenderness.  Skin: Negative for rash.  Allergic/Immunologic: Negative for susceptible to infections.  Neurological: Positive for numbness and weakness.  Hematological: Negative for bruising/bleeding tendency.  Psychiatric/Behavioral: Negative for sleep disturbance.    PMFS History:  Patient Active Problem List   Diagnosis Date Noted  . Herpes genitalis in women 01/24/2019  . Hearing loss due to cerumen impaction, left 12/26/2017  . TSH elevation 02/07/2017  . Raynaud's syndrome without gangrene 07/02/2016  . Osteopenia of multiple sites 07/02/2016  . Vitamin D deficiency 07/02/2016  . Primary osteoarthritis of both knees 07/02/2016  . Primary osteoarthritis of both hands 07/02/2016  . Primary osteoarthritis of both feet 07/02/2016  . Pure hyperglyceridemia 12/27/2015  . Hyperglycemia 12/27/2015  . ILD (interstitial lung disease) ([place]) 12/10/2015  . Diastolic dysfunction 62/37/6283  . HTN (hypertension) 10/07/2011  . Dyslipidemia, goal LDL below 160 10/07/2011  . Scleroderma ([place]) 10/07/2011  . Varicose veins 10/07/2011    Past Medical History:  Diagnosis Date  . Dyspnea    MYOVIEW, 12/29/2006 - post-stress EF 81%,no ECG changes, EKG negative for ischemia  . Hyperlipidemia   . Hypertension    2D ECHO, 12/24/2006 - EF 45-55%, normal  . Osteoarthritis   . Swelling of extremity    LEA VENOUS DUPLEX, 08/19/2011 - deep valvular insufficiency noted with right femoral and popliteal veins and left common femoral vein  . TIA (transient ischemic attack)    CAROTID DOPPLER, 12/22/2006 -  Right and Left ICAs-no evidence of diameter reduction    Family History  Problem Relation Age of Onset  . Hyperlipidemia Father   . Cirrhosis Father 49  . Kidney failure Father 15  . Stroke Maternal Grandmother 85  . Hypertension Maternal Grandmother 40  . Angina Maternal Grandfather 26  . Heart attack Maternal Grandfather 88   . Lung disease Paternal Grandmother   . Atrial fibrillation Mother 26  . Hypertension Mother    Past Surgical History:  Procedure Laterality Date  . TONSILLECTOMY     Social History   Social History Narrative  . Not on file   Immunization History  Administered Date(s) Administered  . Fluad Quad(high Dose 65+) 01/24/2019  . Influenza, High Dose Seasonal PF 10/17/2019  . PFIZER SARS-COV-2 Vaccination 03/13/2019, 04/05/2019, 01/24/2020  . Pneumococcal Conjugate-13 11/20/2014  . Pneumococcal Polysaccharide-23 11/17/2013  . Tdap 07/29/2016  . Zoster 12/09/2009     Objective: Vital Signs: BP 115/70 (BP Location: Left Arm, Patient Position: Sitting, Cuff Size: Normal)   Pulse 86   Resp 16   Ht 5\' 6"  (1.676 m)   Wt 189 lb (85.7 kg)   BMI 30.51 kg/m    Physical Exam Vitals and nursing note reviewed.  Constitutional:      Appearance: She is well-developed and well-nourished.  HENT:     Head: Normocephalic and atraumatic.  Eyes:     Extraocular Movements: EOM normal.     Conjunctiva/sclera: Conjunctivae normal.  Cardiovascular:     Rate and Rhythm: Normal rate and regular rhythm.     Pulses: Intact distal pulses.     Heart sounds: Normal heart sounds.  Pulmonary:     Effort: Pulmonary effort is normal.     Breath sounds: Normal breath sounds.  Abdominal:     General: Bowel sounds are normal.     Palpations: Abdomen is soft.  Musculoskeletal:     Cervical back: Normal range of motion.  Lymphadenopathy:     Cervical: No cervical adenopathy.  Skin:    General: Skin is warm and dry.     Capillary Refill: Capillary refill takes less than 2 seconds.     Comments: She had  nailbed capillary dropout without any capillary dilation.  Sclerodactyly was noted.  Skin fibrosis was noted on upper and lower extremities.  Poikiloderma was noted.  Neurological:     Mental Status: She is alert and oriented to person, place, and time.  Psychiatric:        Mood and Affect: Mood and  affect normal.        Behavior: Behavior normal.      Musculoskeletal Exam: C-spine was in good range of motion.  Shoulder joints, elbow joints, wrist joints with good range of motion.  She had tenderness over right acromioclavicular joint.  She had bilateral PIP and DIP thickening.  Sclerodactyly was noted.  Hip joints, knee joints, ankles, MTPs and PIPs with good range of motion.  She had tenderness over left trochanteric bursa.  CDAI Exam: CDAI Score: -- Patient Global: --; Provider Global: -- Swollen: --; Tender: -- Joint Exam 01/26/2020   No joint exam has been documented for this visit   There is currently no information documented on the homunculus. Go to the Rheumatology activity and complete the homunculus joint exam.  Investigation: No additional findings.  Imaging: ECHOCARDIOGRAM COMPLETE  Result Date: 01/23/2020    ECHOCARDIOGRAM REPORT   Patient Name:   Savannah [name] Date of Exam: 01/23/2020 Medical Rec #:  [id number]   Height:       66.0 in Accession #:    [id number]  Weight:       187.2 lb Date of Birth:  1945/09/30   BSA:          1.944 m Patient Age:    1 years    BP:           160/90 mmHg Patient Gender: F           HR:           80 bpm. Exam Location:  Outpatient Procedure: 2D Echo Indications:    Congestive Heart Failure I50.9  History:        Patient has prior history of Echocardiogram examinations, most                 recent 12/30/2018. Risk Factors:Hypertension and Dyslipidemia.  Sonographer:    Savannah [name] RDCS (AE) Referring Phys: [id number] DANIEL R BENSIMHON IMPRESSIONS  1. Left ventricular ejection fraction, by estimation, is 60 to 65%. The left ventricle has normal function. The left ventricle has no regional wall motion abnormalities. Left ventricular diastolic parameters are consistent with Grade I diastolic dysfunction (impaired relaxation).  2. Right ventricular systolic function is normal. The right ventricular size is normal.  3. The mitral valve is normal in  structure. Trivial mitral valve regurgitation.  4. The aortic valve is normal in structure. There is mild calcification of the aortic valve. Aortic valve regurgitation is trivial.  5. The inferior vena cava is normal in size with greater than 50% respiratory variability, suggesting right atrial pressure of 3 mmHg. FINDINGS  Left Ventricle: Left ventricular ejection fraction, by estimation, is 60 to 65%. The left ventricle has normal function. The left ventricle has no regional wall motion abnormalities. The left ventricular internal cavity size was normal in size. There is  no left ventricular hypertrophy. Left ventricular diastolic parameters are consistent with Grade I diastolic dysfunction (impaired relaxation). Right Ventricle: The right ventricular size is normal. No increase in right ventricular wall thickness. Right ventricular systolic function is normal. Left Atrium: Left atrial size was normal in size. Right Atrium: Right atrial size was normal in size. Pericardium: There is no evidence of pericardial effusion. Mitral Valve: The mitral valve is normal in structure. Trivial mitral valve regurgitation. Tricuspid Valve: The tricuspid valve is normal in structure. Tricuspid valve regurgitation is trivial. Aortic Valve: The aortic valve is normal in structure. There is mild calcification of the aortic valve. Aortic valve regurgitation is trivial. Pulmonic Valve: The pulmonic valve was grossly normal. Pulmonic valve regurgitation is trivial. Aorta: The aortic root and ascending aorta are structurally normal, with no evidence of dilitation. Venous: The inferior vena cava is normal in size with greater than 50% respiratory variability, suggesting right atrial pressure of 3 mmHg. IAS/Shunts: No atrial level shunt detected by color flow Doppler.  LEFT VENTRICLE PLAX 2D LVIDd:         4.20 cm     Diastology LVIDs:         2.90 cm     LV e' medial:    6.09 cm/s LV PW:         1.00 cm     LV E/e' medial:  11.1 LV IVS:         0.80 cm     LV e' lateral:   7.83 cm/s LVOT diam:     1.80 cm     LV E/e' lateral: 8.7 LV SV:  50 LV SV Index:   26 LVOT Area:     2.54 cm  LV Volumes (MOD) LV vol d, MOD A2C: 69.2 ml LV vol d, MOD A4C: 57.9 ml LV vol s, MOD A2C: 30.7 ml LV vol s, MOD A4C: 23.0 ml LV SV MOD A2C:     38.5 ml LV SV MOD A4C:     57.9 ml LV SV MOD BP:      38.1 ml RIGHT VENTRICLE RV S prime:     8.38 cm/s TAPSE (M-mode): 1.2 cm LEFT ATRIUM             Index       RIGHT ATRIUM           Index LA diam:        3.60 cm 1.85 cm/m  RA Area:     13.40 cm LA Vol (A2C):   55.6 ml 28.60 ml/m RA Volume:   30.30 ml  15.58 ml/m LA Vol (A4C):   38.4 ml 19.75 ml/m LA Biplane Vol: 48.0 ml 24.69 ml/m  AORTIC VALVE LVOT Vmax:   90.50 cm/s LVOT Vmean:  56.800 cm/s LVOT VTI:    0.196 m  AORTA Ao Root diam: 3.30 cm MITRAL VALVE MV Area (PHT): 2.82 cm    SHUNTS MV Decel Time: 269 msec    Systemic VTI:  0.20 m MV E velocity: 67.90 cm/s  Systemic Diam: 1.80 cm MV A velocity: 90.60 cm/s MV E/A ratio:  0.75 Savannah Bickers MD Electronically signed by Savannah Bickers MD Signature Date/Time: 01/23/2020/11:07:54 AM    Final    XR HIP UNILAT W OR W/O PELVIS 2-3 VIEWS LEFT  Result Date: 01/26/2020 No SI joint or hip joint narrowing was noted.  No chondrocalcinosis was noted. Impression: Unremarkable x-ray of the hip joint.  XR Shoulder Right  Result Date: 01/26/2020 No glenohumeral joint space narrowing was noted.  Acromioclavicular narrowing and spurring was noted.  No chondrocalcinosis was noted. Impression: These findings are consistent with right acromioclavicular arthritis.   Recent Labs: Lab Results  Component Value Date   WBC 5.5 01/09/2020   HGB 13.9 01/09/2020   PLT 259 01/09/2020   NA 142 01/09/2020   K 4.4 01/09/2020   CL 107 (H) 01/09/2020   CO2 25 01/09/2020   GLUCOSE 97 01/09/2020   BUN 13 01/09/2020   CREATININE 0.78 01/09/2020   BILITOT 1.5 (H) 01/09/2020   ALKPHOS 68 01/09/2020   AST 20 01/09/2020    ALT 28 01/09/2020   PROT 6.4 01/09/2020   ALBUMIN 4.5 01/09/2020   CALCIUM 10.1 01/09/2020   GFRAA 86 01/09/2020   December 09, 2019 TSH normal, CMP GFR 75 AST 20 ALT 28, CBC WBC 5.5, hemoglobin 13.9, platelets 259, LDL 65  Speciality Comments: No specialty comments available.  Procedures:  No procedures performed Allergies: Codeine, Cymbalta [duloxetine hcl], Lyrica [pregabalin], Penicillins, Sulfa antibiotics, Zetia [ezetimibe], and Zocor [simvastatin]   Assessment / Plan:     Visit Diagnoses: Scleroderma ([place]) - + skin biopsy with sclerodactyly.  She has scleroderma, ILD, Raynaud's, decreased nailbed capillaries.  ILD (interstitial lung disease) ([place]) - she is followed by Dr. Chase Caller.  She is scheduled for high-resolution CT on December 23.  She states the PFTs showed some worsening.  Raynaud's syndrome without gangrene-keeping core temperature warm and warm clothing was discussed.  Chronic right shoulder pain -she has been experiencing discomfort in her right shoulder for the last 2 months.  She tenderness over right subacromial region.  Plan:  XR Shoulder Right.  X-ray showed acromioclavicular arthritis.  X-ray findings were discussed with patient.  I have given her a handout on exercises also prescription for physical therapy which she can do in [place].  Primary osteoarthritis of both hands-she has DIP and PIP thickening.  Joint protection was discussed.  Pain in left hip -she complains of left hip pain for the last 2 months.  Plan: XR HIP UNILAT W OR W/O PELVIS 2-3 VIEWS LEFT x-rays were reviewed with the patient.  There was no hip joint narrowing or SI joint changes.  The clinical findings are consistent with trochanteric bursitis.  I have given her a handout on IT band stretches.  Also a prescription for physical therapy was given which she can do in [place].  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  She is  currently doing well and does not want to do Euflexxa as she will be leaving in January.  Primary osteoarthritis of both feet-proper fitting shoes were discussed.  Osteopenia of multiple sites - February 01, 2019 DEXA showed T score -1.8, BMD 0.651 percent change -3% from 2017 right femoral neck.  DEXA findings were reviewed.  Use of calcium rich diet and exercise was emphasized.  History of hyperlipidemia-recent LDL is normal.  History of vitamin D deficiency  History of diverticulosis  History of hypertension-patient states that she was recently started on beta-blocker.  Blood pressure is in desirable range.  Physical deconditioning  She is fully vaccinated against COVID-19 virus and also received a booster.  Use of mask, social distancing and hygiene was discussed.  Orders: Orders Placed This Encounter  Procedures  . XR HIP UNILAT W OR W/O PELVIS 2-3 VIEWS LEFT  . XR Shoulder Right   No orders of the defined types were placed in this encounter.   Patient states she will schedule appointment when she comes back from [place].  Follow-Up Instructions: Return if symptoms worsen or fail to improve, for Scleroderma, Osteoarthritis.   Bo Merino, MD  Note - This record has been created using Editor, commissioning.  Chart creation errors have been sought, but may not always  have been located. Such creation errors do not reflect on  the standard of medical care.

## 2020-01-17 ENCOUNTER — Other Ambulatory Visit: Payer: Self-pay

## 2020-01-17 ENCOUNTER — Ambulatory Visit (INDEPENDENT_AMBULATORY_CARE_PROVIDER_SITE_OTHER): Payer: Medicare Other | Admitting: Internal Medicine

## 2020-01-17 DIAGNOSIS — J8489 Other specified interstitial pulmonary diseases: Secondary | ICD-10-CM | POA: Diagnosis not present

## 2020-01-17 DIAGNOSIS — M359 Systemic involvement of connective tissue, unspecified: Secondary | ICD-10-CM

## 2020-01-17 LAB — PULMONARY FUNCTION TEST
DL/VA % pred: 80 %
DL/VA: 3.26 ml/min/mmHg/L
DLCO cor % pred: 75 %
DLCO cor: 15.52 ml/min/mmHg
DLCO unc % pred: 76 %
DLCO unc: 15.75 ml/min/mmHg
FEF 25-75 Pre: 1.67 L/sec
FEF2575-%Pred-Pre: 93 %
FEV1-%Pred-Pre: 97 %
FEV1-Pre: 2.27 L
FEV1FVC-%Pred-Pre: 100 %
FEV6-%Pred-Pre: 101 %
FEV6-Pre: 3 L
FEV6FVC-%Pred-Pre: 104 %
FVC-%Pred-Pre: 97 %
FVC-Pre: 3.02 L
Pre FEV1/FVC ratio: 75 %
Pre FEV6/FVC Ratio: 100 %

## 2020-01-17 NOTE — Progress Notes (Signed)
Spirometry and Dlco done today. 

## 2020-01-23 ENCOUNTER — Other Ambulatory Visit: Payer: Self-pay

## 2020-01-23 ENCOUNTER — Ambulatory Visit (HOSPITAL_COMMUNITY)
Admission: RE | Admit: 2020-01-23 | Discharge: 2020-01-23 | Disposition: A | Discharge status: Home / Self Care | Payer: Medicare Other | Source: Ambulatory Visit | Attending: Internal Medicine | Admitting: Internal Medicine

## 2020-01-23 ENCOUNTER — Encounter (HOSPITAL_COMMUNITY): Payer: Self-pay | Admitting: Internal Medicine

## 2020-01-23 ENCOUNTER — Ambulatory Visit (HOSPITAL_BASED_OUTPATIENT_CLINIC_OR_DEPARTMENT_OTHER)
Admission: RE | Admit: 2020-01-23 | Discharge: 2020-01-23 | Disposition: A | Discharge status: Home / Self Care | Payer: Medicare Other | Source: Ambulatory Visit | Attending: Internal Medicine | Admitting: Internal Medicine

## 2020-01-23 VITALS — BP 150/90 | HR 80 | Wt 186.8 lb

## 2020-01-23 DIAGNOSIS — I700 Atherosclerosis of aorta: Secondary | ICD-10-CM | POA: Diagnosis not present

## 2020-01-23 DIAGNOSIS — I5032 Chronic diastolic (congestive) heart failure: Secondary | ICD-10-CM

## 2020-01-23 DIAGNOSIS — M349 Systemic sclerosis, unspecified: Secondary | ICD-10-CM | POA: Insufficient documentation

## 2020-01-23 DIAGNOSIS — I2510 Atherosclerotic heart disease of native coronary artery without angina pectoris: Secondary | ICD-10-CM | POA: Diagnosis not present

## 2020-01-23 DIAGNOSIS — Z79899 Other long term (current) drug therapy: Secondary | ICD-10-CM | POA: Insufficient documentation

## 2020-01-23 DIAGNOSIS — I10 Essential (primary) hypertension: Secondary | ICD-10-CM | POA: Diagnosis not present

## 2020-01-23 DIAGNOSIS — I509 Heart failure, unspecified: Secondary | ICD-10-CM | POA: Diagnosis not present

## 2020-01-23 DIAGNOSIS — E785 Hyperlipidemia, unspecified: Secondary | ICD-10-CM | POA: Diagnosis not present

## 2020-01-23 DIAGNOSIS — Z8249 Family history of ischemic heart disease and other diseases of the circulatory system: Secondary | ICD-10-CM | POA: Diagnosis not present

## 2020-01-23 DIAGNOSIS — Z8673 Personal history of transient ischemic attack (TIA), and cerebral infarction without residual deficits: Secondary | ICD-10-CM | POA: Insufficient documentation

## 2020-01-23 DIAGNOSIS — J849 Interstitial pulmonary disease, unspecified: Secondary | ICD-10-CM | POA: Diagnosis not present

## 2020-01-23 DIAGNOSIS — Z7982 Long term (current) use of aspirin: Secondary | ICD-10-CM | POA: Insufficient documentation

## 2020-01-23 DIAGNOSIS — I110 Hypertensive heart disease with heart failure: Secondary | ICD-10-CM | POA: Insufficient documentation

## 2020-01-23 DIAGNOSIS — I38 Endocarditis, valve unspecified: Secondary | ICD-10-CM

## 2020-01-23 LAB — ECHOCARDIOGRAM COMPLETE
Area-P 1/2: 2.82 cm2
Calc EF: 59.1 %
S' Lateral: 2.9 cm
Single Plane A2C EF: 55.6 %
Single Plane A4C EF: 60.3 %

## 2020-01-23 NOTE — Addendum Note (Signed)
Encounter addended by: Scarlette Calico, RN on: 01/23/2020 12:42 PM  Actions taken: Clinical Note Signed

## 2020-01-23 NOTE — Progress Notes (Signed)
Patient ID: Savannah [name], female   DOB: 06/08/1945, 74 y.o.   MRN: [id number]  Referring Physician: Estanislado [name] Primary Care: Savannah [name]  Primary Cardiologist: Savannah [name] (formerly)  HPI: Savannah [name] is a 74 y.o. female with hx of scleroderma, TIA, HTN, Dyslipidemia with elevated TGs, and LE varicose veins. She was referred by Dr. Estanislado [name] for screening for [facility] in setting of scleroderma.  Says she was diagnosed with scleroderma around 2005. Noticed skin on her fingers had bx which confirmed scleroderma.   Had dyspnea in 2008. And had a stress test and echo. Her EF was  45-55% by Echo 11/08. Myoview in 11/08 negative for ischemia with post stress test EF of 81%.  Hi-res CT 10/17: c/w with mild ILD  Hi res CT 10/20: mild ILD slightly improved from previous. Personally reviewed  Here for routine f/u. Followed by Savannah [name], repeat PFTs largely unchanged. Echos have been normal but found to have mild ILD on CT scan. No edema, has not needed prn lasix. Doing well struggling with arthritis in collarbone, hips and knees. Has had issues with Raynaud's in past. Some SOB with stairs and strenuous activity, CP, edema, dizziness, orthopnea/PND.   Echo today 01/23/20: EF 60-65% Echo 12/23/17: EF 55-60% mild Aov Calcification RV normal No TR.  Echo 11/20 EF 60-65% RV normal No PAH    PFTs 11/21  FEV1 2.27L (97%) FVC  3.02L (97%) DLCO 75%   PFTs 10/20  FEV1 2.38L (100%) FVC  3.15L (98%) DLCO 79%    PFTs 10/19 FEV1 2.48L (100%) FVC  3.21L (98%) DLCO 63%   PFTs 6/18 FEV1 2.55 L (101%) FVC   3.24 L (98%) DLCO  59%  PFTs  11/09/15 FEV1 2.65 L (99%) FVC   3.39 L (101%) DLCO  60%  PFTs 08/08/14 FEV1 2.54 L (99%) FVC   3.36 L (99%) DLCO  66%   Past Medical History:  Diagnosis Date  . Dyspnea    MYOVIEW, 12/29/2006 - post-stress EF 81%,no ECG changes, EKG negative for ischemia  . Hyperlipidemia   . Hypertension    2D ECHO, 12/24/2006 - EF 45-55%, normal  . Swelling of extremity    LEA  VENOUS DUPLEX, 08/19/2011 - deep valvular insufficiency noted with right femoral and popliteal veins and left common femoral vein  . TIA (transient ischemic attack)    CAROTID DOPPLER, 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    Current Outpatient Medications  Medication Sig Dispense Refill  . aspirin 81 MG tablet Take 81 mg by mouth.    Marland Kitchen b complex vitamins capsule Take 1 capsule by mouth 2 (two) times a week.    . Cholecalciferol (D3 MAXIMUM STRENGTH PO) Take 5,000 Units by mouth 2 (two) times a week.     . Coenzyme Q10 200 MG capsule Take 200 mg by mouth 2 (two) times a week.    . diphenhydrAMINE (BENADRYL) 25 mg capsule Take 25 mg by mouth every 6 (six) hours as needed.    . ezetimibe (ZETIA) 10 MG tablet TAKE 1 TABLET BY MOUTH EVERY DAY 90 tablet 3  . furosemide (LASIX) 20 MG tablet TAKE  20  MG( 1/2 TABLET ) TO 40 MG ( 1 TABLET) AS NEEDED FOR  FLUID OR EDEMA 90 tablet 3  . icosapent Ethyl (VASCEPA) 1 g capsule Take 1 capsule (1 g total) by mouth 2 (two) times a week. 36 capsule 2  . LIVALO 2 MG TABS TAKE 1 TABLET BY MOUTH TWICE  A WEEK 90 tablet 3  . loratadine (CLARITIN) 10 MG tablet Take 10 mg by mouth daily as needed for allergies.    Marland Kitchen losartan (COZAAR) 100 MG tablet TAKE 1 TABLET BY MOUTH EVERY DAY 90 tablet 1  . Magnesium 300 MG CAPS Take 300 mg by mouth 3 (three) times a week.    . Menaquinone-7 (VITAMIN K2 PO) Take 50 mcg by mouth 2 (two) times a week.     . metoprolol succinate (TOPROL XL) 25 MG 24 hr tablet Take 1 tablet (25 mg total) by mouth daily. 90 tablet 3  . naproxen sodium (ALEVE) 220 MG tablet Take 220 mg by mouth as needed.     . potassium chloride SA (KLOR-CON M20) 20 MEQ tablet Take 1 tablet (20 mEq total) by mouth daily as needed. When you take Furosemide (Lasix). 90 tablet 3  . valACYclovir (VALTREX) 1000 MG tablet TAKE 1 TABLET BY MOUTH EVERY DAY 30 tablet 2  . Vitamin E 45 MG CAPS 400 Units daily.      No current facility-administered  medications for this encounter.    Allergies  Allergen Reactions  . Codeine Nausea Only  . Cymbalta [Duloxetine Hcl] Hives  . Lyrica [Pregabalin] Hives  . Penicillins   . Sulfa Antibiotics Hives  . Zetia [Ezetimibe] Other (See Comments)    Myalgia  . Zocor [Simvastatin] Other (See Comments)    Myalgia      Social History   Socioeconomic History  . Marital status: Married    Spouse name: Not on file  . Number of children: Not on file  . Years of education: Not on file  . Highest education level: Not on file  Occupational History  . Not on file  Tobacco Use  . Smoking status: Never Smoker  . Smokeless tobacco: Never Used  Vaping Use  . Vaping Use: Never used  Substance and Sexual Activity  . Alcohol use: Yes    Alcohol/week: 6.0 standard drinks    Types: 6 drink(s) per week  . Drug use: No  . Sexual activity: Not on file  Other Topics Concern  . Not on file  Social History Narrative  . Not on file   Social Determinants of Health   Financial Resource Strain:   . Difficulty of Paying Living Expenses: Not on file  Food Insecurity:   . Worried About Charity fundraiser in the Last Year: Not on file  . Ran Out of Food in the Last Year: Not on file  Transportation Needs:   . Lack of Transportation (Medical): Not on file  . Lack of Transportation (Non-Medical): Not on file  Physical Activity:   . Days of Exercise per Week: Not on file  . Minutes of Exercise per Session: Not on file  Stress:   . Feeling of Stress : Not on file  Social Connections:   . Frequency of Communication with Friends and Family: Not on file  . Frequency of Social Gatherings with Friends and Family: Not on file  . Attends Religious Services: Not on file  . Active Member of Clubs or Organizations: Not on file  . Attends Archivist Meetings: Not on file  . Marital Status: Not on file  Intimate Partner Violence:   . Fear of Current or Ex-Partner: Not on file  . Emotionally Abused:  Not on file  . Physically Abused: Not on file  . Sexually Abused: Not on file      Family History  Problem  Relation Age of Onset  . Hyperlipidemia Father   . Cirrhosis Father 61  . Kidney failure Father 62  . Stroke Maternal Grandmother 85  . Hypertension Maternal Grandmother 40  . Angina Maternal Grandfather 62  . Heart attack Maternal Grandfather 88  . Lung disease Paternal Grandmother   . Atrial fibrillation Mother 13  . Hypertension Mother     Vitals:   01/23/20 1057  BP: (!) 150/90  Pulse: 80  SpO2: 97%  Weight: 84.7 kg (186 lb 12.8 oz)    PHYSICAL EXAM: General:  Well appearing. No resp difficulty HEENT: normal Neck: supple. no JVD. Carotids 2+ bilat; no bruits. No lymphadenopathy or thryomegaly appreciated. Cor: PMI nondisplaced. Regular rate & rhythm. No rubs, gallops or murmurs. Lungs: clear Abdomen: soft, nontender, nondistended. No hepatosplenomegaly. No bruits or masses. Good bowel sounds. Extremities: no cyanosis, clubbing, rash, edema Neuro: alert & orientedx3, cranial nerves grossly intact. moves all 4 extremities w/o difficulty. Affect pleasant   ASSESSMENT & PLAN:  1. Scleroderma with related mild ILD - Overall doing well. Follows w/ Dr. Chase [name] - ECHO today reviewed personally with her and no evidence of PAH or RV strain - PFTs stable spirometry and stable DLCO.  - Hi res CT 10/20: mild ILD slightly improved from previous.  - With stable echos and PFTs can switch to every other year screening. Low threshold for RHC with any evidence of PAH, RV strain or decreasing DLCO.    2. HTN  - elevated today 150/90; BP logs at home ~130-140/50-70 - Continue losartan 100 mg daily - Continue Toprol XL 25 mg daily - Managed by Savannah [name] - Can consider addition of amlodipine for tighter BP control (in addition of helping with Raynaud's) as needed  3. Dyslipidemia  - Has severe hyperTG now on Vascepa, Zetia and Livalo - She is now following with Savannah [name]   4. TIA  - no deficits  5. Coronary calcifications - CT chest reviewed personally. She has 3v coronary calcifications. We discussed what this meant. We also discussed possible stress testing or cath but she is quite active without angina and prefers to avoid further testing currently.  - RF reduction is being aggressively managed by Savannah [name] - continue asa  Glori Bickers, MD  11:35 AM

## 2020-01-23 NOTE — Progress Notes (Signed)
  Echocardiogram 2D Echocardiogram has been performed.  Jennette Dubin 01/23/2020, 10:48 AM

## 2020-01-23 NOTE — Patient Instructions (Addendum)
Please call our office in November 2023 to schedule your 2 year follow up with echocardiogram.  If you have any questions or concerns before your next appointment please send [place] a message through [place] or call our office at [phone].    TO LEAVE A MESSAGE FOR THE NURSE SELECT OPTION 2, PLEASE LEAVE A MESSAGE INCLUDING: . YOUR NAME . DATE OF BIRTH . CALL BACK NUMBER . REASON FOR CALL**this is important as we prioritize the call backs  [place] AS LONG AS YOU CALL BEFORE 4:00 PM At the [facility], you and your health needs are our priority. As part of our continuing mission to provide you with exceptional heart care, we have created designated Provider Care Teams. These Care Teams include your primary Cardiologist (physician) and Advanced Practice Providers (APPs- Physician Assistants and Nurse Practitioners) who all work together to provide you with the care you need, when you need it.   You may see any of the following providers on your designated Care Team at your next follow up: Marland Kitchen Dr Glori Bickers . Dr Loralie Champagne . Darrick Grinder, NP . Lyda Jester, PA . Audry Riles, PharmD   Please be sure to bring in all your medications bottles to every appointment.

## 2020-01-24 DIAGNOSIS — Z23 Encounter for immunization: Secondary | ICD-10-CM | POA: Diagnosis not present

## 2020-01-25 ENCOUNTER — Telehealth: Payer: Self-pay | Admitting: Internal Medicine

## 2020-01-25 ENCOUNTER — Other Ambulatory Visit: Payer: Self-pay

## 2020-01-25 ENCOUNTER — Ambulatory Visit (INDEPENDENT_AMBULATORY_CARE_PROVIDER_SITE_OTHER): Payer: Medicare Other | Admitting: Internal Medicine

## 2020-01-25 ENCOUNTER — Encounter: Payer: Self-pay | Admitting: Internal Medicine

## 2020-01-25 VITALS — BP 120/64 | HR 81 | Ht 69.0 in | Wt 189.2 lb

## 2020-01-25 DIAGNOSIS — M349 Systemic sclerosis, unspecified: Secondary | ICD-10-CM

## 2020-01-25 DIAGNOSIS — M359 Systemic involvement of connective tissue, unspecified: Secondary | ICD-10-CM | POA: Diagnosis not present

## 2020-01-25 DIAGNOSIS — J8489 Other specified interstitial pulmonary diseases: Secondary | ICD-10-CM | POA: Diagnosis not present

## 2020-01-25 DIAGNOSIS — J849 Interstitial pulmonary disease, unspecified: Secondary | ICD-10-CM

## 2020-01-25 DIAGNOSIS — I2510 Atherosclerotic heart disease of native coronary artery without angina pectoris: Secondary | ICD-10-CM | POA: Diagnosis not present

## 2020-01-25 NOTE — Patient Instructions (Addendum)
ICD-10-CM   1. 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 Scleroderma ([facility])  M34.9    Glad you are feeling well.  However on pulmonary function test even though it appears normal this seems to be a slow steady reduction in both capacity and diffusion.  This could be age-related but I am also concerned it could reflect worsening pulmonary fibrosis  Plan -Refer to pulmonary rehabilitation in [facility] rehab in [place] at [place], [place], [place] high-resolution CT chest supine and prone in the next few to several days  Follow-up -This depends on the results of the high-resolution CT chest  -If this shows worsening fibrosis were going to have you come in in the next few weeks to discuss antifibrotic therapy of nintedanib or CellCept with myself or nurse practitioner  -If the CT is stable then will just have you return in 1 year

## 2020-01-25 NOTE — Progress Notes (Signed)
IOV 12/10/2015 - history some the patient and review of the outside records from Dr. Bronson Curb in cardiology office notes 11/19/2015 Dr. Haroldine Laws  Chief Complaint  Patient presents with  . Pulmonary Consult    Referred by Dr Estanislado Pandy- Scleroderma - Had HRCT 11/22/15- SOB with walking up driveway or up steps - Denies cough or wheezing    74 year old female was diagnosed with scleroderma in 2008 when she was 74 years old. Notice many problems with the skin in her fingers and the form of a sclerodactyly. I According to her history and referral notes she did have a biopsy with Dr. Wilhemina Bonito that confirmed the scleroderma diagnosis     In 2008 she had dyspnea. She had a cardiac stress test at that time she had a Myoview for stress ejection fraction was 81% but resting echo showe d ejection fraction of 45%. She does not remember this but this is per cardiology notes. He tells me now that she's had insidious onset of dyspnea for the last few years. It has been steadily progressive. It is moderate in intensity. She notices this when she climbs stairs or goes to [employer]. It is relieved by rest. It is not associated with any chest pain. This no associated cough or edema or orthopnea paroxysmal nocturnal dyspnea presyncope. According to the cardiology notes she snores a lot.  She had echocardiogram this fall 2017 document below that shows grade 1 diastolic dysfunction with good left ventricular ejection fraction. Right ventricular pump function is normal. No comment on pulmonary systolic pressures.  She underwent pulmonary function test that shows isolated reduction in diffusion capacity documented below. She then underwent high-resolution CT chest that I personally visualized shows some faint groundglass opacities. Therefore she has been referred here.  She tells me that for her scleroderma she's been treated with methotrexate in The past. She Does Have the Details. She Says This Did Not Help Her.  Overall She Is a Nonsmoker. She Drinks Alcohol Socially. She Does Walking for Exercise but Does Have Right Knee Joint Discomfort. Rheumatology Notes Indicate Significant Issues with Osteoarthritis Especially of the Right Knee Is Documented in Office Visits in November 2016 and Subsequently.Also arthritis is also documented in her hand joints   Tests below  Pulmonary function test 11/09/2015 FVC 2.4 L/1%, FEV1 2.6 L/104%. DLCO 5.3/95% and DLCO 17.03/60%. She has isolated reduction in diffusion capacity  IMPRESSION: HRCT 11/22/15 1. Mild patchy subpleural ground-glass attenuation and reticulation in both lungs, most prominent in the lower lobes. No significant traction bronchiectasis. No frank honeycombing. 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 A follow-up high-resolution chest CT study in 12 months would be useful to assess temporal pattern stability. 2. Additional findings include aortic atherosclerosis, three-vessel coronary atherosclerosis and possible 0.9 cm lower left thyroid lobe nodule.   Electronically Signed   By: [name] M.D.   On: 11/22/2015 13:14  Walking desaturation test 185 feet 3 laps on room air today in the office 12/10/2015: Resting pulse ox was 94% after 3 laps pulse ox was 98%. She had some pain with her right knee where she had no dyspnea. Resting heart rate was 87/m Otilia to 93/m  Echocardiogram 11/19/2015 shows grade 1 diastolic dysfunction but otherwise reported as normal. Including right ventricle    OV 10/09/2016  Chief Complaint  Patient presents with    . Follow-up    Pt states she feels she is not able to breathe in enough  air. Pt denies significant cough. Pt states she has chest tightness at random times. Pt denies f/c/s.    Follow-up scleroderma with ILD associated with mild reduction in diffusion capacity - mild severity of disease on observation therapy  Last seen  October 2017. Since then she's been on observation therapy. She had pulmonary function testing in June 2018 that in the last 2 years is showing mild decline in diffusion capacity. She tells me that correlating with this she is feeling more shortness of breath with exertion and even at rest she feels that she has to take a deep breath. There is mild associated cough occasionally but there is no wheeze. There is no fever or edema. Otherwise feeling well. She has some questions about getting pneumonia vaccine. Apparently she's getting an alert about this. Review of the chart shows that she is up-to-date with both Pneumovax and Prevnar within 5 years  Walking desaturation test 185 feet 3 laps on room air: Walking desaturation test on 10/09/2016 185 feet x 3 laps on RA :  did NOT  desaturate. Rest pulse ox was 98%, final pulse ox was 98%. HR response 83/min at rest to 98/min at peak exertion.    OV 12/17/2016  Chief Complaint  Patient presents with  . Follow-up    pft done today and HRCT done 10/23. 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 This continues unchanged. Within the staging workup high-resolution CT scan of the chest shows mild subtle interstitial lung disease that is unchanged compared to one year ago. However on pulmonary function tests this seems to be a trend was decline in both FVC and DLCO. If there is worsening interstitial lung disease this is not reflected on the CT scan [as yet] we also look for pulmonary hypertension with echocardiogram which she had October 2018 shows grade 1 diastolic dysfunctionbut no documentation of pulmonary hypertension. She has never been to pulmonary rehabilitation because of her knee issues she is a bit deconditioned. She is interested in pulmonary rehabilitation but she wants to get this  done in [place] where she lives between January and May 2019    IMPRESSION: compared to oct 2017 1. 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 2. Aortic atherosclerosis, in addition to left main and 3 vessel coronary artery disease. Please note that although the presence of coronary artery calcium documents the presence of coronary artery disease, the severity of this disease and any potential stenosis cannot be assessed on this non-gated CT examination. Assessment for potential risk factor modification, dietary therapy or pharmacologic therapy may be warranted, if clinically indicated.  Aortic Atherosclerosis (ICD10-I70.0).   Electronically Signed   By: Vinnie Langton M.D.   On: 12/09/2016 12:07  OV 12/16/2017  Subjective:  Patient ID: Savannah [name], female , DOB: 1945-09-25 , age 82 y.o. , MRN: [id number] , ADDRESS: [address] [place] [phone]   12/16/2017 -   Chief Complaint  Patient presents with  . Follow-up    shortness of breath     HPI Savannah [name] 74 y.o. 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  Stable over time.  She returns for follow-up.  She spent the time from January through September in [place].  She was  supposed to return in July but her return got delayed.  Therefore she is here for this follow-up visit in October 2019.  And once again in January 2020 all the way through July 2020 she will be back in [place] in [place].  At this point in time she is continues to do well without any worsening shortness of breath.  She can do activities of daily living and climb hills and stairs without a problem.  Although she does feel that the edema in her fingers is slowly getting worse particularly in the right hand.  There is no chest pain or any other side effects.  She had pulmonary function test  today that shows continued stability over 3-1/2 years.      OV 01/17/2019  Subjective:  Patient ID: Savannah [name], female , DOB: 11-15-1945 , age 40 y.o. , MRN: [id number] , ADDRESS: [address] [place] [id number]   01/17/2019 -   Chief Complaint  Patient presents with  . Follow-up    Pt states she has been doing good since last visit. Pt denies any complaints of any cough and states SOB is about the same since last visit.   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  HPI Savannah [name] 74 y.o. -she returns for 1 year follow-up.  She says overall she is stable in terms of her shortness of breath.  She had a high-resolution CT chest which is a 2-year follow-up.  Reported as stable.  Her symptom scores are listed below.  She did see cardiology Dr. Haroldine Laws who has been reassured with her echocardiogram.  She does have coronary artery calcification he is aware of this.  She did have pulmonary function test today and her DLCO seems fluctuant but stable and a slight decline in her FVC.  She is surprised by this because overall she feels stable.  She is not had a flu shot and she defers a flu shot every year.  She is asking about the Covid vaccine.  She is social distancing and does not spend her time with any human clusyer r of more than 2 people over the holiday weekend.   OV 01/25/2020   Subjective:  Patient ID: Savannah [name], female , DOB: 26-Aug-1945, age 19 y.o. years. , MRN: [id number],  ADDRESS: [address] [place] [address] PCP  Janith Lima, MD Providers : Treatment Team:  Attending Provider: Brand Males, MD Patient Care Team: Janith Lima, MD as PCP - General (Internal Medicine)    Chief Complaint  Patient presents with  . Follow-up    PFT performed 11/30.  Pt states she has been doing good since last visit and denies any complaints.     Follow-up mild interstitial lung disease in the setting of  scleroderma: On observation therapy  HPI Savannah [name] 74 y.o. -last seen in November 2020.  After that she was supposed to come in the summer 2021.  However she lives part-time in [place] husband undergoes physical therapy.  They have a condominium there.  Therefore she has not been able to attend.  From a symptom standpoint she says she is stable.  She wants to the point rehabilitation in [place].  She wants a referral.  She has a rheumatology appointment tomorrow.  She saw Dr. Haroldine Laws cardiology yesterday.  Echocardiogram reviewed did not show any pulmonary hypertension.  2-year follow-up has been given for her.  She is very active.  Current symptom scores are listed below.  She did pulmonary function test.  This is documented below in my view it is showing steady slow decline in both diffusion and capacity.  However symptomatically she does not feel this.  But looking at the ILD symptom score below symptoms seem progressive based on numbers.   SYMPTOM SCALE - ILD 01/17/2019  01/25/2020   O2 use ra ra  Shortness of Breath 0 -> 5 scale with 5 being worst (score 6 If unable to do)   At rest 0 1  Simple tasks - showers, clothes change, eating, shaving 0 1  Household (dishes, doing bed, laundry) 2 3  Shopping 2 2  Walking level at own pace 2 2  Walking up Stairs 3 3  Total (40 - 48) Dyspnea Score 9 12  How bad is your cough? 0  1  How bad is your fatigue 2  2  Nausea   1  Vomiting   0  Diarrhea   0  Anxiety   1  Depression   2       Results for Savannah, [name] (MRN [id number]) as of 12/17/2016 11:16  Ref. Range 08/08/2014 09:58 11/09/2015 10:48 08/04/2016 10:57 12/17/2016 09:43 12/16/2017  12/29/2018 01/25/2020   FVC-Pre Latest Units: L 3.36 3.39 3.24 3.21 3.26 3.15 3.02  FVC-%Pred-Pre Latest Units: % 99 101 98 97 98 100% 97%   Results for Savannah, [name] (MRN [id number]) as of 12/17/2016 11:16  Ref. Range 08/08/2014 09:58 11/09/2015 10:48 08/04/2016 10:57 12/17/2016 09:43  12/16/2017  01/17/2019 GLI equation 01/25/2020   DLCO unc Latest Units: ml/min/mmHg 18.83 17.03 16.89 16.59 18.03 16.54 15.52  DLCO unc % pred Latest Units: % 66 60 59 58 63 79% 75%     Simple office walk 185 feet x  3 laps goal with forehead probe 12/16/2017  01/17/2019   O2 used Room air Room air  Number laps completed 3 3  Comments about pace normal avg pace  Resting Pulse Ox/HR 98% and 70/min 100% and 82/min  Final Pulse Ox/HR 99% and 90/min 98% and 99/min  Desaturated </= 88% no no  Desaturated <= 3% points no no  Got Tachycardic >/= 90/min yes yes  Symptoms at end of test x nomne  Miscellaneous comments x x    HRCT compared to Oct 2018 -> OCt 2020 IMPRESSION: 1. Moderate patchy air trapping, indicative of small airways disease, unchanged. 2. Minimal patchy subpleural reticulation and ground-glass attenuation in the lungs, slightly less prominent in the interval. Stable scattered mild cylindrical bronchiolectasis in the mid to lower lungs. No progressive findings. Findings could represent a very mild nonspecific interstitial pneumonia (NSIP). Findings are not compatible with usual interstitial pneumonia (UIP). Findings are suggestive of an alternative diagnosis (not UIP) per consensus guidelines: Diagnosis of Idiopathic Pulmonary Fibrosis: An Official ATS/ERS/JRS/ALAT Clinical Practice Guideline. [place], Iss 5, [phone], Oct 18 2016. 3. Three-vessel coronary atherosclerosis.  Aortic Atherosclerosis (ICD10-I70.0).   Electronically Signed   By: [name] M.D.   On: 12/07/2018 15:43  ROS - per HPI     has a past medical history of Dyspnea, Hyperlipidemia, Hypertension, Swelling of extremity, and TIA (transient ischemic attack).   reports that she has never smoked. She has never used smokeless tobacco.  No past surgical history on file.  Allergies  Allergen Reactions  . Codeine Nausea Only  . Cymbalta [Duloxetine Hcl] Hives   . Lyrica [Pregabalin] Hives  . Penicillins   .  Sulfa Antibiotics Hives  . Zetia [Ezetimibe] Other (See Comments)    Myalgia  . Zocor [Simvastatin] Other (See Comments)    Myalgia    Immunization History  Administered Date(s) Administered  . Fluad Quad(high Dose 65+) 01/24/2019  . Influenza, High Dose Seasonal PF 10/17/2019  . PFIZER SARS-COV-2 Vaccination 03/13/2019, 04/05/2019, 01/24/2020  . Pneumococcal Conjugate-13 11/20/2014  . Pneumococcal Polysaccharide-23 11/17/2013  . Tdap 07/29/2016  . Zoster 12/09/2009    Family History  Problem Relation Age of Onset  . Hyperlipidemia Father   . Cirrhosis Father 26  . Kidney failure Father 36  . Stroke Maternal Grandmother 85  . Hypertension Maternal Grandmother 40  . Angina Maternal Grandfather 37  . Heart attack Maternal Grandfather 88  . Lung disease Paternal Grandmother   . Atrial fibrillation Mother 39  . Hypertension Mother      Current Outpatient Medications:  .  aspirin 81 MG tablet, Take 81 mg by mouth., Disp: , Rfl:  .  b complex vitamins capsule, Take 1 capsule by mouth 2 (two) times a week., Disp: , Rfl:  .  Cholecalciferol (D3 MAXIMUM STRENGTH PO), Take 5,000 Units by mouth 2 (two) times a week. , Disp: , Rfl:  .  Coenzyme Q10 200 MG capsule, Take 200 mg by mouth 2 (two) times a week., Disp: , Rfl:  .  diphenhydrAMINE (BENADRYL) 25 mg capsule, Take 25 mg by mouth every 6 (six) hours as needed., Disp: , Rfl:  .  ezetimibe (ZETIA) 10 MG tablet, TAKE 1 TABLET BY MOUTH EVERY DAY (Patient taking differently: Take 10 mg by mouth 2 (two) times a week. ), Disp: 90 tablet, Rfl: 3 .  furosemide (LASIX) 20 MG tablet, TAKE  20  MG( 1/2 TABLET ) TO 40 MG ( 1 TABLET) AS NEEDED FOR  FLUID OR EDEMA, Disp: 90 tablet, Rfl: 3 .  icosapent Ethyl (VASCEPA) 1 g capsule, Take 1 capsule (1 g total) by mouth 2 (two) times a week., Disp: 36 capsule, Rfl: 2 .  LIVALO 2 MG TABS, TAKE 1 TABLET BY MOUTH TWICE A WEEK, Disp: 90 tablet, Rfl: 3 .   loratadine (CLARITIN) 10 MG tablet, Take 10 mg by mouth daily as needed for allergies., Disp: , Rfl:  .  losartan (COZAAR) 100 MG tablet, TAKE 1 TABLET BY MOUTH EVERY DAY, Disp: 90 tablet, Rfl: 1 .  Magnesium 300 MG CAPS, Take 300 mg by mouth 3 (three) times a week., Disp: , Rfl:  .  Menaquinone-7 (VITAMIN K2 PO), Take 50 mcg by mouth 2 (two) times a week. , Disp: , Rfl:  .  metoprolol succinate (TOPROL XL) 25 MG 24 hr tablet, Take 1 tablet (25 mg total) by mouth daily., Disp: 90 tablet, Rfl: 3 .  naproxen sodium (ALEVE) 220 MG tablet, Take 220 mg by mouth as needed. , Disp: , Rfl:  .  potassium chloride SA (KLOR-CON M20) 20 MEQ tablet, Take 1 tablet (20 mEq total) by mouth daily as needed. When you take Furosemide (Lasix)., Disp: 90 tablet, Rfl: 3 .  valACYclovir (VALTREX) 1000 MG tablet, TAKE 1 TABLET BY MOUTH EVERY DAY, Disp: 30 tablet, Rfl: 2 .  Vitamin E 45 MG CAPS, 400 Units daily. , Disp: , Rfl:       Objective:   Vitals:   01/25/20 1127  BP: 120/64  Pulse: 81  SpO2: 97%  Weight: 189 lb 3.2 oz (85.8 kg)  Height: 5\' 9"  (1.753 m)    Estimated body mass index is  27.94 kg/m as calculated from the following:   Height as of this encounter: 5\' 9"  (1.753 m).   Weight as of this encounter: 189 lb 3.2 oz (85.8 kg).  @WEIGHTCHANGE @  Filed Weights   01/25/20 1127  Weight: 189 lb 3.2 oz (85.8 kg)     Physical Exam   General: No distress. Looks well Neuro: Alert and Oriented x 3. GCS 15. Speech normal Psych: Pleasant Resp:  Barrel Chest - no.  Wheeze - no, Crackles - possible mild, No overt respiratory distress CVS: Normal heart sounds. Murmurs - no Ext: Stigmata of Connective Tissue Disease - mild scleroderma HEENT: Normal upper airway. PEERL +. No post nasal drip        Assessment:       ICD-10-CM   1. Interstitial lung disease due to connective tissue disease ([facility])  J84.89 AMB referral to pulmonary rehabilitation   M35.9   2. Scleroderma ([facility])  M34.9 AMB referral to  pulmonary rehabilitation  3. Interstitial pulmonary disease ([facility])  J84.9 CT Chest High Resolution       Plan:     Patient Instructions     ICD-10-CM   1. 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 Scleroderma ([place])  M34.9    Glad you are feeling well.  However on pulmonary function test even though it appears normal this seems to be a slow steady reduction in both capacity and diffusion.  This could be age-related but I am also concerned it could reflect worsening pulmonary fibrosis  Plan -Refer to pulmonary rehabilitation in [facility] rehab in [place] at [place], [place], [place] high-resolution CT chest supine and prone in the next few to several days  Follow-up -This depends on the results of the high-resolution CT chest  -If this shows worsening fibrosis were going to have you come in in the next few weeks to discuss antifibrotic therapy of nintedanib or CellCept with myself or nurse practitioner  -If the CT is stable then will just have you return in 1 year       SIGNATURE    Dr. Brand Males, M.D., F.C.C.P,  Pulmonary and Critical Care Medicine Staff Physician, [place] Director - Interstitial Lung Disease  Program  Pulmonary [place] at [place], [place], [address]  Pager: [phone], If no answer or between  15:00h - 7:00h: call [phone] Telephone: [phone]  12:18 PM 01/25/2020

## 2020-01-25 NOTE — Telephone Encounter (Signed)
MR, please advise if you know of any places in [place] that do pulmonary rehab.

## 2020-01-26 ENCOUNTER — Ambulatory Visit: Payer: Self-pay

## 2020-01-26 ENCOUNTER — Ambulatory Visit (INDEPENDENT_AMBULATORY_CARE_PROVIDER_SITE_OTHER): Payer: Medicare Other | Admitting: Rheumatology

## 2020-01-26 ENCOUNTER — Encounter: Payer: Self-pay | Admitting: Rheumatology

## 2020-01-26 VITALS — BP 115/70 | HR 86 | Resp 16 | Ht 66.0 in | Wt 189.0 lb

## 2020-01-26 DIAGNOSIS — I7300 Raynaud's syndrome without gangrene: Secondary | ICD-10-CM

## 2020-01-26 DIAGNOSIS — J849 Interstitial pulmonary disease, unspecified: Secondary | ICD-10-CM

## 2020-01-26 DIAGNOSIS — I2510 Atherosclerotic heart disease of native coronary artery without angina pectoris: Secondary | ICD-10-CM | POA: Diagnosis not present

## 2020-01-26 DIAGNOSIS — M349 Systemic sclerosis, unspecified: Secondary | ICD-10-CM | POA: Diagnosis not present

## 2020-01-26 DIAGNOSIS — M19071 Primary osteoarthritis, right ankle and foot: Secondary | ICD-10-CM

## 2020-01-26 DIAGNOSIS — Z8719 Personal history of other diseases of the digestive system: Secondary | ICD-10-CM

## 2020-01-26 DIAGNOSIS — G8929 Other chronic pain: Secondary | ICD-10-CM | POA: Diagnosis not present

## 2020-01-26 DIAGNOSIS — M19042 Primary osteoarthritis, left hand: Secondary | ICD-10-CM

## 2020-01-26 DIAGNOSIS — M19072 Primary osteoarthritis, left ankle and foot: Secondary | ICD-10-CM

## 2020-01-26 DIAGNOSIS — Z8639 Personal history of other endocrine, nutritional and metabolic disease: Secondary | ICD-10-CM

## 2020-01-26 DIAGNOSIS — M8589 Other specified disorders of bone density and structure, multiple sites: Secondary | ICD-10-CM

## 2020-01-26 DIAGNOSIS — Z8679 Personal history of other diseases of the circulatory system: Secondary | ICD-10-CM

## 2020-01-26 DIAGNOSIS — M19041 Primary osteoarthritis, right hand: Secondary | ICD-10-CM | POA: Diagnosis not present

## 2020-01-26 DIAGNOSIS — M170 Bilateral primary osteoarthritis of knee: Secondary | ICD-10-CM

## 2020-01-26 DIAGNOSIS — R5381 Other malaise: Secondary | ICD-10-CM

## 2020-01-26 DIAGNOSIS — M25511 Pain in right shoulder: Secondary | ICD-10-CM

## 2020-01-26 DIAGNOSIS — M25552 Pain in left hip: Secondary | ICD-10-CM | POA: Diagnosis not present

## 2020-01-26 NOTE — Patient Instructions (Signed)
Iliotibial Band Syndrome Rehab Ask your health care provider which exercises are safe for you. Do exercises exactly as told by your health care provider and adjust them as directed. It is normal to feel mild stretching, pulling, tightness, or discomfort as you do these exercises. Stop right away if you feel sudden pain or your pain gets significantly worse. Do not begin these exercises until told by your health care provider. Stretching and range-of-motion exercises These exercises warm up your muscles and joints and improve the movement and flexibility of your hip and pelvis. Quadriceps stretch, prone  1. Lie on your abdomen on a firm surface, such as a bed or padded floor (prone position). 2. Bend your left / right knee and reach back to hold your ankle or pant leg. If you cannot reach your ankle or pant leg, loop a belt around your foot and grab the belt instead. 3. Gently pull your heel toward your buttocks. Your knee should not slide out to the side. You should feel a stretch in the front of your thigh and knee (quadriceps). 4. Hold this position for __________ seconds. Repeat __________ times. Complete this exercise __________ times a day. Iliotibial band stretch An iliotibial band is a strong band of muscle tissue that runs from the outer side of your hip to the outer side of your thigh and knee. 1. Lie on your side with your left / right leg in the top position. 2. Bend both of your knees and grab your left / right ankle. Stretch out your bottom arm to help you balance. 3. Slowly bring your top knee back so your thigh goes behind your trunk. 4. Slowly lower your top leg toward the floor until you feel a gentle stretch on the outside of your left / right hip and thigh. If you do not feel a stretch and your knee will not fall farther, place the heel of your other foot on top of your knee and pull your knee down toward the floor with your foot. 5. Hold this position for __________  seconds. Repeat __________ times. Complete this exercise __________ times a day. Strengthening exercises These exercises build strength and endurance in your hip and pelvis. Endurance is the ability to use your muscles for a long time, even after they get tired. Straight leg raises, side-lying This exercise strengthens the muscles that rotate the leg at the hip and move it away from your body (hip abductors). 1. Lie on your side with your left / right leg in the top position. Lie so your head, shoulder, hip, and knee line up. You may bend your bottom knee to help you balance. 2. Roll your hips slightly forward so your hips are stacked directly over each other and your left / right knee is facing forward. 3. Tense the muscles in your outer thigh and lift your top leg 4-6 inches (10-15 cm). 4. Hold this position for __________ seconds. 5. Slowly return to the starting position. Let your muscles relax completely before doing another repetition. Repeat __________ times. Complete this exercise __________ times a day. Leg raises, prone This exercise strengthens the muscles that move the hips (hip extensors). 1. Lie on your abdomen on your bed or a firm surface. You can put a pillow under your hips if that is more comfortable for your lower back. 2. Bend your left / right knee so your foot is straight up in the air. 3. Squeeze your buttocks muscles and lift your left / right thigh   off the bed. Do not let your back arch. 4. Tense your thigh muscle as hard as you can without increasing any knee pain. 5. Hold this position for __________ seconds. 6. Slowly lower your leg to the starting position and allow it to relax completely. Repeat __________ times. Complete this exercise __________ times a day. Hip hike 1. Stand sideways on a bottom step. Stand on your left / right leg with your other foot unsupported next to the step. You can hold on to the railing or wall for balance if needed. 2. Keep your knees  straight and your torso square. Then lift your left / right hip up toward the ceiling. 3. Slowly let your left / right hip lower toward the floor, past the starting position. Your foot should get closer to the floor. Do not lean or bend your knees. Repeat __________ times. Complete this exercise __________ times a day. This information is not intended to replace advice given to you by your health care provider. Make sure you discuss any questions you have with your health care provider. Document Revised: 05/27/2018 Document Reviewed: 11/25/2017 Elsevier Patient Education  [place]. Shoulder Exercises Ask your health care provider which exercises are safe for you. Do exercises exactly as told by your health care provider and adjust them as directed. It is normal to feel mild stretching, pulling, tightness, or discomfort as you do these exercises. Stop right away if you feel sudden pain or your pain gets worse. Do not begin these exercises until told by your health care provider. Stretching exercises External rotation and abduction This exercise is sometimes called corner stretch. This exercise rotates your arm outward (external rotation) and moves your arm out from your body (abduction). 5. Stand in a doorway with one of your feet slightly in front of the other. This is called a staggered stance. If you cannot reach your forearms to the door frame, stand facing a corner of a room. 6. Choose one of the following positions as told by your health care provider: ? Place your hands and forearms on the door frame above your head. ? Place your hands and forearms on the door frame at the height of your head. ? Place your hands on the door frame at the height of your elbows. 7. Slowly move your weight onto your front foot until you feel a stretch across your chest and in the front of your shoulders. Keep your head and chest upright and keep your abdominal muscles tight. 8. Hold for __________  seconds. 9. To release the stretch, shift your weight to your back foot. Repeat __________ times. Complete this exercise __________ times a day. Extension, standing 6. Stand and hold a broomstick, a cane, or a similar object behind your back. ? Your hands should be a little wider than shoulder width apart. ? Your palms should face away from your back. 7. Keeping your elbows straight and your shoulder muscles relaxed, move the stick away from your body until you feel a stretch in your shoulders (extension). ? Avoid shrugging your shoulders while you move the stick. Keep your shoulder blades tucked down toward the middle of your back. 8. Hold for __________ seconds. 9. Slowly return to the starting position. Repeat __________ times. Complete this exercise __________ times a day. Range-of-motion exercises Pendulum  6. Stand near a wall or a surface that you can hold onto for balance. 7. Bend at the waist and let your left / right arm hang straight down. Use  your other arm to support you. Keep your back straight and do not lock your knees. 8. Relax your left / right arm and shoulder muscles, and move your hips and your trunk so your left / right arm swings freely. Your arm should swing because of the motion of your body, not because you are using your arm or shoulder muscles. 9. Keep moving your hips and trunk so your arm swings in the following directions, as told by your health care provider: ? Side to side. ? Forward and backward. ? In clockwise and counterclockwise circles. 10. Continue each motion for __________ seconds, or for as long as told by your health care provider. 11. Slowly return to the starting position. Repeat __________ times. Complete this exercise __________ times a day. Shoulder flexion, standing  7. Stand and hold a broomstick, a cane, or a similar object. Place your hands a little more than shoulder width apart on the object. Your left / right hand should be palm up,  and your other hand should be palm down. 8. Keep your elbow straight and your shoulder muscles relaxed. Push the stick up with your healthy arm to raise your left / right arm in front of your body, and then over your head until you feel a stretch in your shoulder (flexion). ? Avoid shrugging your shoulder while you raise your arm. Keep your shoulder blade tucked down toward the middle of your back. 9. Hold for __________ seconds. 10. Slowly return to the starting position. Repeat __________ times. Complete this exercise __________ times a day. Shoulder abduction, standing 4. Stand and hold a broomstick, a cane, or a similar object. Place your hands a little more than shoulder width apart on the object. Your left / right hand should be palm up, and your other hand should be palm down. 5. Keep your elbow straight and your shoulder muscles relaxed. Push the object across your body toward your left / right side. Raise your left / right arm to the side of your body (abduction) until you feel a stretch in your shoulder. ? Do not raise your arm above shoulder height unless your health care provider tells you to do that. ? If directed, raise your arm over your head. ? Avoid shrugging your shoulder while you raise your arm. Keep your shoulder blade tucked down toward the middle of your back. 6. Hold for __________ seconds. 7. Slowly return to the starting position. Repeat __________ times. Complete this exercise __________ times a day. Internal rotation  1. Place your left / right hand behind your back, palm up. 2. Use your other hand to dangle an exercise band, a towel, or a similar object over your shoulder. Grasp the band with your left / right hand so you are holding on to both ends. 3. Gently pull up on the band until you feel a stretch in the front of your left / right shoulder. The movement of your arm toward the center of your body is called internal rotation. ? Avoid shrugging your shoulder while  you raise your arm. Keep your shoulder blade tucked down toward the middle of your back. 4. Hold for __________ seconds. 5. Release the stretch by letting go of the band and lowering your hands. Repeat __________ times. Complete this exercise __________ times a day. Strengthening exercises External rotation  1. Sit in a stable chair without armrests. 2. Secure an exercise band to a stable object at elbow height on your left / right side. 3. Place  a soft object, such as a folded towel or a small pillow, between your left / right upper arm and your body to move your elbow about 4 inches (10 cm) away from your side. 4. Hold the end of the exercise band so it is tight and there is no slack. 5. Keeping your elbow pressed against the soft object, slowly move your forearm out, away from your abdomen (external rotation). Keep your body steady so only your forearm moves. 6. Hold for __________ seconds. 7. Slowly return to the starting position. Repeat __________ times. Complete this exercise __________ times a day. Shoulder abduction  1. Sit in a stable chair without armrests, or stand up. 2. Hold a __________ weight in your left / right hand, or hold an exercise band with both hands. 3. Start with your arms straight down and your left / right palm facing in, toward your body. 4. Slowly lift your left / right hand out to your side (abduction). Do not lift your hand above shoulder height unless your health care provider tells you that this is safe. ? Keep your arms straight. ? Avoid shrugging your shoulder while you do this movement. Keep your shoulder blade tucked down toward the middle of your back. 5. Hold for __________ seconds. 6. Slowly lower your arm, and return to the starting position. Repeat __________ times. Complete this exercise __________ times a day. Shoulder extension 1. Sit in a stable chair without armrests, or stand up. 2. Secure an exercise band to a stable object in front of  you so it is at shoulder height. 3. Hold one end of the exercise band in each hand. Your palms should face each other. 4. Straighten your elbows and lift your hands up to shoulder height. 5. Step back, away from the secured end of the exercise band, until the band is tight and there is no slack. 6. Squeeze your shoulder blades together as you pull your hands down to the sides of your thighs (extension). Stop when your hands are straight down by your sides. Do not let your hands go behind your body. 7. Hold for __________ seconds. 8. Slowly return to the starting position. Repeat __________ times. Complete this exercise __________ times a day. Shoulder row 1. Sit in a stable chair without armrests, or stand up. 2. Secure an exercise band to a stable object in front of you so it is at waist height. 3. Hold one end of the exercise band in each hand. Position your palms so that your thumbs are facing the ceiling (neutral position). 4. Bend each of your elbows to a 90-degree angle (right angle) and keep your upper arms at your sides. 5. Step back until the band is tight and there is no slack. 6. Slowly pull your elbows back behind you. 7. Hold for __________ seconds. 8. Slowly return to the starting position. Repeat __________ times. Complete this exercise __________ times a day. Shoulder press-ups  1. Sit in a stable chair that has armrests. Sit upright, with your feet flat on the floor. 2. Put your hands on the armrests so your elbows are bent and your fingers are pointing forward. Your hands should be about even with the sides of your body. 3. Push down on the armrests and use your arms to lift yourself off the chair. Straighten your elbows and lift yourself up as much as you comfortably can. ? Move your shoulder blades down, and avoid letting your shoulders move up toward your ears. ? Keep  your feet on the ground. As you get stronger, your feet should support less of your body weight as you  lift yourself up. 4. Hold for __________ seconds. 5. Slowly lower yourself back into the chair. Repeat __________ times. Complete this exercise __________ times a day. Wall push-ups  1. Stand so you are facing a stable wall. Your feet should be about one arm-length away from the wall. 2. Lean forward and place your palms on the wall at shoulder height. 3. Keep your feet flat on the floor as you bend your elbows and lean forward toward the wall. 4. Hold for __________ seconds. 5. Straighten your elbows to push yourself back to the starting position. Repeat __________ times. Complete this exercise __________ times a day. This information is not intended to replace advice given to you by your health care provider. Make sure you discuss any questions you have with your health care provider. Document Revised: 05/28/2018 Document Reviewed: 03/05/2018 Elsevier Patient Education  [place].

## 2020-01-27 DIAGNOSIS — H16143 Punctate keratitis, bilateral: Secondary | ICD-10-CM | POA: Diagnosis not present

## 2020-01-27 DIAGNOSIS — H2513 Age-related nuclear cataract, bilateral: Secondary | ICD-10-CM | POA: Diagnosis not present

## 2020-01-27 DIAGNOSIS — H35373 Puckering of macula, bilateral: Secondary | ICD-10-CM | POA: Diagnosis not present

## 2020-01-27 NOTE — Telephone Encounter (Signed)
Attempted to call pt but unable to reach. Left message for her to return call. 

## 2020-01-27 NOTE — Telephone Encounter (Signed)
I do not know unfortunately. She will have to find oiut. She told me about that 1 place which I put in my note but if they do not do it not sure who will

## 2020-02-02 DIAGNOSIS — D1801 Hemangioma of skin and subcutaneous tissue: Secondary | ICD-10-CM | POA: Diagnosis not present

## 2020-02-02 DIAGNOSIS — D225 Melanocytic nevi of trunk: Secondary | ICD-10-CM | POA: Diagnosis not present

## 2020-02-02 DIAGNOSIS — D2261 Melanocytic nevi of right upper limb, including shoulder: Secondary | ICD-10-CM | POA: Diagnosis not present

## 2020-02-02 DIAGNOSIS — D2271 Melanocytic nevi of right lower limb, including hip: Secondary | ICD-10-CM | POA: Diagnosis not present

## 2020-02-02 DIAGNOSIS — Z85828 Personal history of other malignant neoplasm of skin: Secondary | ICD-10-CM | POA: Diagnosis not present

## 2020-02-02 DIAGNOSIS — C44519 Basal cell carcinoma of skin of other part of trunk: Secondary | ICD-10-CM | POA: Diagnosis not present

## 2020-02-02 DIAGNOSIS — L821 Other seborrheic keratosis: Secondary | ICD-10-CM | POA: Diagnosis not present

## 2020-02-02 DIAGNOSIS — L570 Actinic keratosis: Secondary | ICD-10-CM | POA: Diagnosis not present

## 2020-02-02 DIAGNOSIS — D485 Neoplasm of uncertain behavior of skin: Secondary | ICD-10-CM | POA: Diagnosis not present

## 2020-02-06 ENCOUNTER — Telehealth: Payer: Self-pay | Admitting: Internal Medicine

## 2020-02-06 NOTE — Telephone Encounter (Signed)
Patient called and is wondering if it is time for her to get the Pneumonia shot. Please call her at [phone].

## 2020-02-06 NOTE — Telephone Encounter (Signed)
She should get a pneumovax in the next year

## 2020-02-06 NOTE — Telephone Encounter (Signed)
Called and spoke to pt. Informed her of the recs per MR. Pt verbalized understanding and denied any further questions or concerns at this time.   

## 2020-02-07 NOTE — Telephone Encounter (Signed)
Pt has been informed.

## 2020-02-09 ENCOUNTER — Encounter (HOSPITAL_COMMUNITY): Payer: Self-pay

## 2020-02-09 ENCOUNTER — Other Ambulatory Visit: Payer: Self-pay

## 2020-02-09 ENCOUNTER — Ambulatory Visit (HOSPITAL_COMMUNITY)
Admission: RE | Admit: 2020-02-09 | Discharge: 2020-02-09 | Disposition: A | Discharge status: Home / Self Care | Payer: Medicare Other | Source: Ambulatory Visit | Attending: Internal Medicine | Admitting: Internal Medicine

## 2020-02-09 DIAGNOSIS — J849 Interstitial pulmonary disease, unspecified: Secondary | ICD-10-CM | POA: Diagnosis not present

## 2020-02-09 DIAGNOSIS — I2510 Atherosclerotic heart disease of native coronary artery without angina pectoris: Secondary | ICD-10-CM | POA: Diagnosis not present

## 2020-02-09 DIAGNOSIS — J84112 Idiopathic pulmonary fibrosis: Secondary | ICD-10-CM | POA: Diagnosis not present

## 2020-02-09 DIAGNOSIS — I700 Atherosclerosis of aorta: Secondary | ICD-10-CM | POA: Diagnosis not present

## 2020-02-11 ENCOUNTER — Telehealth: Payer: Self-pay | Admitting: Internal Medicine

## 2020-02-11 DIAGNOSIS — J849 Interstitial pulmonary disease, unspecified: Secondary | ICD-10-CM

## 2020-02-11 NOTE — Telephone Encounter (Signed)
  Mild interstial lung abnormalities. No change in 1 year. This is good news but needs continued observation  Plan  - do spiro/dlco in 6 months  And 12 months  - return in 6 months for tele vsiti from [place] or can be face to face -  - monitor symptoms - any progression then we need to intervene with meds  CT Chest High Resolution  Result Date: 02/09/2020 CLINICAL DATA:  Interstitial lung disease EXAM: CT CHEST WITHOUT CONTRAST TECHNIQUE: Multidetector CT imaging of the chest was performed following the standard protocol without intravenous contrast. High resolution imaging of the lungs, as well as inspiratory and expiratory imaging, was performed. COMPARISON:  12/07/2018 FINDINGS: Cardiovascular: Scattered aortic atherosclerosis. Normal heart size. Three-vessel coronary artery calcifications. No pericardial effusion. Mediastinum/Nodes: No enlarged mediastinal, hilar, or axillary lymph nodes. Thyroid gland, trachea, and esophagus demonstrate no significant findings. Lungs/Pleura: No significant change in minimal irregular peripheral interstitial opacity at the lung bases (series 4, image 261). Unchanged mild, lobular air trapping on expiratory phase imaging. No pleural effusion or pneumothorax. Upper Abdomen: No acute abnormality. Musculoskeletal: No chest wall mass or suspicious bone lesions identified. IMPRESSION: 1. No significant change in minimal irregular peripheral interstitial opacity at the lung bases. Findings are nonspecific and could reflect minimal fibrotic interstitial lung disease or bland sequelae of prior infection or aspiration. If characterized by ATS pulmonary fibrosis criteria, these are in an early "indeterminate for UIP" pattern. Consider ongoing annual follow-up CT to assess for stability of fibrotic findings and pattern if there is a high clinical suspicion for fibrotic interstitial lung disease. Findings are indeterminate for UIP per consensus guidelines: Diagnosis of Idiopathic  Pulmonary Fibrosis: An Official ATS/ERS/JRS/ALAT Clinical Practice Guideline. [place], Iss 5, [phone], Oct 18 2016. 2. Unchanged mild, lobular air trapping on expiratory phase imaging, consistent with small airways disease. 3. Coronary artery disease. Aortic Atherosclerosis (ICD10-I70.0). Electronically Signed   By: Eddie Candle M.D.   On: 02/09/2020 14:08

## 2020-02-13 ENCOUNTER — Other Ambulatory Visit: Payer: Self-pay | Admitting: Cardiovascular Disease

## 2020-02-13 NOTE — Telephone Encounter (Signed)
Called and spoke with pt letting her know the info stated by MR and she verbalized understanding. Recall has been placed for PFT and OV in 6 months. Nothing further needed.

## 2020-02-14 MED ORDER — LOSARTAN POTASSIUM 100 MG PO TABS: 100.0000 mg | ORAL_TABLET | Freq: Every day | ORAL | 3 refills | Status: DC

## 2020-02-20 ENCOUNTER — Telehealth: Payer: Self-pay

## 2020-02-20 NOTE — Telephone Encounter (Signed)
Prior Authorization renewal for Livalo 2 mg twice weekly submitted for pt.   Key: [id number]  After submitting renewal, the following message resulted:  Outcome: Drug is covered by current benefit plan. No further PA activity needed"

## 2020-03-03 DIAGNOSIS — Z1231 Encounter for screening mammogram for malignant neoplasm of breast: Secondary | ICD-10-CM | POA: Diagnosis not present

## 2020-03-19 DIAGNOSIS — K5790 Diverticulosis of intestine, part unspecified, without perforation or abscess without bleeding: Secondary | ICD-10-CM | POA: Diagnosis not present

## 2020-03-19 DIAGNOSIS — D123 Benign neoplasm of transverse colon: Secondary | ICD-10-CM | POA: Diagnosis not present

## 2020-03-19 DIAGNOSIS — K5730 Diverticulosis of large intestine without perforation or abscess without bleeding: Secondary | ICD-10-CM | POA: Diagnosis not present

## 2020-03-19 DIAGNOSIS — Z86010 Personal history of colonic polyps: Secondary | ICD-10-CM | POA: Diagnosis not present

## 2020-03-19 DIAGNOSIS — K621 Rectal polyp: Secondary | ICD-10-CM | POA: Diagnosis not present

## 2020-03-19 DIAGNOSIS — K635 Polyp of colon: Secondary | ICD-10-CM | POA: Diagnosis not present

## 2020-03-19 DIAGNOSIS — Z1211 Encounter for screening for malignant neoplasm of colon: Secondary | ICD-10-CM | POA: Diagnosis not present

## 2020-03-19 DIAGNOSIS — D125 Benign neoplasm of sigmoid colon: Secondary | ICD-10-CM | POA: Diagnosis not present

## 2020-04-04 ENCOUNTER — Encounter: Payer: Self-pay | Admitting: Internal Medicine

## 2020-04-04 ENCOUNTER — Other Ambulatory Visit: Payer: Self-pay | Admitting: Radiology

## 2020-04-04 DIAGNOSIS — Z170 Estrogen receptor positive status [ER+]: Secondary | ICD-10-CM | POA: Diagnosis not present

## 2020-04-04 DIAGNOSIS — C50811 Malignant neoplasm of overlapping sites of right female breast: Secondary | ICD-10-CM | POA: Diagnosis not present

## 2020-04-04 DIAGNOSIS — R922 Inconclusive mammogram: Secondary | ICD-10-CM | POA: Diagnosis not present

## 2020-04-04 DIAGNOSIS — C50911 Malignant neoplasm of unspecified site of right female breast: Secondary | ICD-10-CM | POA: Diagnosis not present

## 2020-04-10 DIAGNOSIS — Z853 Personal history of malignant neoplasm of breast: Secondary | ICD-10-CM | POA: Diagnosis not present

## 2020-04-10 DIAGNOSIS — Z803 Family history of malignant neoplasm of breast: Secondary | ICD-10-CM | POA: Diagnosis not present

## 2020-04-10 DIAGNOSIS — Z86000 Personal history of in-situ neoplasm of breast: Secondary | ICD-10-CM | POA: Diagnosis not present

## 2020-04-10 DIAGNOSIS — C50911 Malignant neoplasm of unspecified site of right female breast: Secondary | ICD-10-CM | POA: Diagnosis not present

## 2020-04-10 DIAGNOSIS — Z86010 Personal history of colonic polyps: Secondary | ICD-10-CM | POA: Diagnosis not present

## 2020-04-12 ENCOUNTER — Encounter: Payer: Self-pay | Admitting: Obstetrics & Gynecology

## 2020-04-23 DIAGNOSIS — Z86010 Personal history of colonic polyps: Secondary | ICD-10-CM | POA: Diagnosis not present

## 2020-04-24 DIAGNOSIS — C50911 Malignant neoplasm of unspecified site of right female breast: Secondary | ICD-10-CM | POA: Diagnosis not present

## 2020-04-27 ENCOUNTER — Telehealth: Payer: Self-pay | Admitting: Internal Medicine

## 2020-04-27 NOTE — Telephone Encounter (Signed)
Dr. Darrelyn Hillock was going to order a CT chest but patient mentioned that she already had one a few months ago.   I faxed via Epic and printed a copy and faxed to the number provided by Office.   Nothing further needed at this time.

## 2020-05-03 DIAGNOSIS — C50911 Malignant neoplasm of unspecified site of right female breast: Secondary | ICD-10-CM | POA: Diagnosis not present

## 2020-05-03 DIAGNOSIS — R9389 Abnormal findings on diagnostic imaging of other specified body structures: Secondary | ICD-10-CM | POA: Diagnosis not present

## 2020-05-09 DIAGNOSIS — Z885 Allergy status to narcotic agent status: Secondary | ICD-10-CM | POA: Diagnosis not present

## 2020-05-09 DIAGNOSIS — C50211 Malignant neoplasm of upper-inner quadrant of right female breast: Secondary | ICD-10-CM | POA: Diagnosis not present

## 2020-05-09 DIAGNOSIS — N6310 Unspecified lump in the right breast, unspecified quadrant: Secondary | ICD-10-CM | POA: Diagnosis not present

## 2020-05-09 DIAGNOSIS — Z170 Estrogen receptor positive status [ER+]: Secondary | ICD-10-CM | POA: Diagnosis not present

## 2020-05-09 DIAGNOSIS — I10 Essential (primary) hypertension: Secondary | ICD-10-CM | POA: Diagnosis not present

## 2020-05-09 DIAGNOSIS — Z8673 Personal history of transient ischemic attack (TIA), and cerebral infarction without residual deficits: Secondary | ICD-10-CM | POA: Diagnosis not present

## 2020-05-09 DIAGNOSIS — I2510 Atherosclerotic heart disease of native coronary artery without angina pectoris: Secondary | ICD-10-CM | POA: Diagnosis not present

## 2020-05-09 DIAGNOSIS — M1990 Unspecified osteoarthritis, unspecified site: Secondary | ICD-10-CM | POA: Diagnosis not present

## 2020-05-09 DIAGNOSIS — Z79899 Other long term (current) drug therapy: Secondary | ICD-10-CM | POA: Diagnosis not present

## 2020-05-09 DIAGNOSIS — J849 Interstitial pulmonary disease, unspecified: Secondary | ICD-10-CM | POA: Diagnosis not present

## 2020-05-09 DIAGNOSIS — C50911 Malignant neoplasm of unspecified site of right female breast: Secondary | ICD-10-CM | POA: Diagnosis not present

## 2020-05-09 DIAGNOSIS — Z7982 Long term (current) use of aspirin: Secondary | ICD-10-CM | POA: Diagnosis not present

## 2020-05-22 DIAGNOSIS — R9389 Abnormal findings on diagnostic imaging of other specified body structures: Secondary | ICD-10-CM | POA: Diagnosis not present

## 2020-05-22 DIAGNOSIS — D493 Neoplasm of unspecified behavior of breast: Secondary | ICD-10-CM | POA: Diagnosis not present

## 2020-05-22 DIAGNOSIS — C50211 Malignant neoplasm of upper-inner quadrant of right female breast: Secondary | ICD-10-CM | POA: Diagnosis not present

## 2020-05-22 DIAGNOSIS — C50911 Malignant neoplasm of unspecified site of right female breast: Secondary | ICD-10-CM | POA: Diagnosis not present

## 2020-05-22 DIAGNOSIS — Z170 Estrogen receptor positive status [ER+]: Secondary | ICD-10-CM | POA: Diagnosis not present

## 2020-05-25 ENCOUNTER — Telehealth: Payer: Self-pay | Admitting: Rheumatology

## 2020-05-25 NOTE — Telephone Encounter (Signed)
Patient calling to let you know she has received a diagnosis of Breast Cancer. Her doctor, Dr. Despina Pole has put her on Anastrozole. Medication states it may cause bone loss. Dr. Orene Desanctis wants her to take calcium. #1. Patient would like to know what Dr. Estanislado Pandy would recommend as far as a Calcium supplement.  #2. Dr. Orene Desanctis would like to get a copy of patient's Bone Density results. Patient signed a medical release form at his office. Fax # [phone], phone # [phone].  #3.  Patient also wants to pick up a copy of the Bone Density report for herself. Please call to advise.

## 2020-05-25 NOTE — Telephone Encounter (Signed)
Calcium carbonate 1200 mg daily is the current recommendation.  Ideally obtaining most of the calcium through her diet would allow for better absorption or if she can try taking a 400 mg supplement 3 times a day.  I will provide an informational handout with information about calcium and vitamin D supplementation for her to pickup with her copy of the DEXA.

## 2020-05-25 NOTE — Telephone Encounter (Signed)
I called patient, bone density faxed, copy of bone density at front desk, please see below and advise.

## 2020-05-25 NOTE — Telephone Encounter (Signed)
I called patient, patient verbalized understanding. 

## 2020-05-30 DIAGNOSIS — I972 Postmastectomy lymphedema syndrome: Secondary | ICD-10-CM | POA: Diagnosis not present

## 2020-05-30 DIAGNOSIS — C50911 Malignant neoplasm of unspecified site of right female breast: Secondary | ICD-10-CM | POA: Diagnosis not present

## 2020-07-06 ENCOUNTER — Other Ambulatory Visit: Payer: Self-pay | Admitting: Cardiovascular Disease

## 2020-07-13 DIAGNOSIS — E559 Vitamin D deficiency, unspecified: Secondary | ICD-10-CM | POA: Diagnosis not present

## 2020-07-13 DIAGNOSIS — C50911 Malignant neoplasm of unspecified site of right female breast: Secondary | ICD-10-CM | POA: Diagnosis not present

## 2020-07-13 DIAGNOSIS — Z79811 Long term (current) use of aromatase inhibitors: Secondary | ICD-10-CM | POA: Diagnosis not present

## 2020-07-13 DIAGNOSIS — M8580 Other specified disorders of bone density and structure, unspecified site: Secondary | ICD-10-CM | POA: Diagnosis not present

## 2020-10-16 DIAGNOSIS — L940 Localized scleroderma [morphea]: Secondary | ICD-10-CM | POA: Diagnosis not present

## 2020-10-16 LAB — COMPREHENSIVE METABOLIC PANEL
Albumin: 4.7 (ref 3.5–5.0)
Calcium: 10.1 (ref 8.7–10.7)
GFR calc non Af Amer: 63

## 2020-10-16 LAB — HEPATIC FUNCTION PANEL
ALT: 25 (ref 7–35)
AST: 18 (ref 13–35)
Alkaline Phosphatase: 65 (ref 25–125)
Bilirubin, Total: 1.4

## 2020-10-16 LAB — CBC AND DIFFERENTIAL
HCT: 43 (ref 36–46)
Hemoglobin: 14.3 (ref 12.0–16.0)
Platelets: 265 (ref 150–399)
WBC: 6.6

## 2020-10-16 LAB — BASIC METABOLIC PANEL
BUN: 22 — AB (ref 4–21)
CO2: 27 — AB (ref 13–22)
Chloride: 105 (ref 99–108)
Creatinine: 0.9 (ref 0.5–1.1)
Glucose: 110
Potassium: 4.3 (ref 3.4–5.3)
Sodium: 141 (ref 137–147)

## 2020-10-16 LAB — VITAMIN D 25 HYDROXY (VIT D DEFICIENCY, FRACTURES): Vit D, 25-Hydroxy: 39

## 2020-10-24 DIAGNOSIS — Z79811 Long term (current) use of aromatase inhibitors: Secondary | ICD-10-CM | POA: Diagnosis not present

## 2020-10-24 DIAGNOSIS — C50911 Malignant neoplasm of unspecified site of right female breast: Secondary | ICD-10-CM | POA: Diagnosis not present

## 2020-10-24 DIAGNOSIS — L940 Localized scleroderma [morphea]: Secondary | ICD-10-CM | POA: Diagnosis not present

## 2020-10-24 DIAGNOSIS — Z170 Estrogen receptor positive status [ER+]: Secondary | ICD-10-CM | POA: Diagnosis not present

## 2020-10-24 DIAGNOSIS — E559 Vitamin D deficiency, unspecified: Secondary | ICD-10-CM | POA: Diagnosis not present

## 2020-11-28 ENCOUNTER — Other Ambulatory Visit: Payer: Self-pay | Admitting: Cardiovascular Disease

## 2020-12-06 ENCOUNTER — Telehealth: Payer: Self-pay | Admitting: Internal Medicine

## 2020-12-06 NOTE — Telephone Encounter (Signed)
disregard

## 2020-12-16 ENCOUNTER — Other Ambulatory Visit: Payer: Self-pay | Admitting: Cardiovascular Disease

## 2020-12-18 DIAGNOSIS — Z23 Encounter for immunization: Secondary | ICD-10-CM | POA: Diagnosis not present

## 2021-01-02 DIAGNOSIS — C50211 Malignant neoplasm of upper-inner quadrant of right female breast: Secondary | ICD-10-CM | POA: Diagnosis not present

## 2021-01-02 DIAGNOSIS — Z170 Estrogen receptor positive status [ER+]: Secondary | ICD-10-CM | POA: Diagnosis not present

## 2021-01-03 DIAGNOSIS — R9389 Abnormal findings on diagnostic imaging of other specified body structures: Secondary | ICD-10-CM | POA: Diagnosis not present

## 2021-01-03 DIAGNOSIS — C50911 Malignant neoplasm of unspecified site of right female breast: Secondary | ICD-10-CM | POA: Diagnosis not present

## 2021-01-05 ENCOUNTER — Other Ambulatory Visit: Payer: Self-pay | Admitting: Internal Medicine

## 2021-01-05 DIAGNOSIS — A6009 Herpesviral infection of other urogenital tract: Secondary | ICD-10-CM
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
## 2021-01-08 ENCOUNTER — Other Ambulatory Visit: Payer: Self-pay

## 2021-01-08 ENCOUNTER — Ambulatory Visit (INDEPENDENT_AMBULATORY_CARE_PROVIDER_SITE_OTHER): Payer: Medicare Other | Admitting: General Practice

## 2021-01-08 ENCOUNTER — Encounter: Payer: Self-pay | Admitting: General Practice

## 2021-01-08 VITALS — BP 118/78 | HR 82 | Ht 66.0 in | Wt 182.0 lb

## 2021-01-08 DIAGNOSIS — I10 Essential (primary) hypertension: Secondary | ICD-10-CM | POA: Diagnosis not present

## 2021-01-08 DIAGNOSIS — I2510 Atherosclerotic heart disease of native coronary artery without angina pectoris: Secondary | ICD-10-CM | POA: Diagnosis not present

## 2021-01-08 DIAGNOSIS — E782 Mixed hyperlipidemia: Secondary | ICD-10-CM | POA: Diagnosis not present

## 2021-01-08 DIAGNOSIS — Z8673 Personal history of transient ischemic attack (TIA), and cerebral infarction without residual deficits: Secondary | ICD-10-CM | POA: Diagnosis not present

## 2021-01-08 DIAGNOSIS — M349 Systemic sclerosis, unspecified: Secondary | ICD-10-CM | POA: Diagnosis not present

## 2021-01-08 MED ORDER — ICOSAPENT ETHYL 1 G PO CAPS: 1.0000 g | ORAL_CAPSULE | ORAL | 2 refills | Status: DC

## 2021-01-08 NOTE — Patient Instructions (Signed)
Medication Instructions:  Your Physician recommend you continue on your current medication as directed.    *If you need a refill on your cardiac medications before your next appointment, please call your pharmacy*   Lab Work: None   Testing/Procedures: None    Follow-Up: At [facility], you and your health needs are our priority.  As part of our continuing mission to provide you with exceptional heart care, we have created designated Provider Care Teams.  These Care Teams include your primary Cardiologist (physician) and Advanced Practice Providers (APPs -  Physician Assistants and Nurse Practitioners) who all work together to provide you with the care you need, when you need it.  We recommend signing up for the patient portal called "MyChart".  Sign up information is provided on this After Visit Summary.  MyChart is used to connect with patients for Virtual Visits (Telemedicine).  Patients are able to view lab/test results, encounter notes, upcoming appointments, etc.  Non-urgent messages can be sent to your provider as well.   To learn more about what you can do with MyChart, go to [email or web address].    Your next appointment:   12 month(s)  The format for your next appointment:   In Person  Provider:   Shelva Majestic, MD    Other Instructions Fiber Content in Foods Fiber is a substance that is found in plant foods, such as fruits, vegetables, whole grains, nuts, seeds, and beans. As part of your treatment and recovery plan, your health care provider may recommend that you eat foods that have specific amounts of dietary fiber. Some conditions may require a high-fiber diet while others may require a low-fiber diet. This sheet gives you information about the dietary fiber content of some common foods. Your health care provider will tell you how much fiber you need in your diet. If you have problems or questions, contact your health care provider or dietitian. What foods are  high in fiber? Fruits Blackberries or raspberries (fresh) --  cup (75 g) has 4 g of fiber. Pear (fresh) -- 1 medium (180 g) has 5.5 g of fiber. Prunes (dried) -- 6 to 8 pieces (57-76 g) has 5 g of fiber. Apple with skin -- 1 medium (182 g) has 4.8 g of fiber. Guava -- 1 cup (128 g) has 8.9 g of fiber. Vegetables Peas (frozen) --  cup (80 g) has 4.4 g of fiber. Potato with skin (baked) -- 1 medium (173 g) has 4.4 g of fiber. Pumpkin (canned) --  cup (122 g) has 5 g of fiber. Brussels sprouts (cooked) --  cup (78 g) has 4 g of fiber. Sweet potato --  cup mashed (124 g) has 4 g of fiber. Winter squash -- 1 cup cooked (205 g) has 5.7 g of fiber. Grains Bran cereal --  cup (31 g) has 8.6 g of fiber. Bulgur (cooked) --  cup (70 g) has 4 g of fiber. Quinoa (cooked) -- 1 cup (185 g) has 5.2 g of fiber. Popcorn -- 3 cups (375 g) popped has 5.8 g of fiber. Spaghetti, whole wheat -- 1 cup (140 g) has 6 g of fiber. Meats and other proteins Pinto beans (cooked) --  cup (90 g) has 7.7 g of fiber. Lentils (cooked) --  cup (90 g) has 7.8 g of fiber. Kidney beans (canned) --  cup (92.5 g) has 5.7 g of fiber. Soybeans (canned, frozen, or fresh) --  cup (92.5 g) has 5.2 g of fiber. Baked beans, plain or  vegetarian (canned) --  cup (130 g) has 5.2 g of fiber. Garbanzo beans or chickpeas (canned) --  cup (90 g) has 6.6 g of fiber. Black beans (cooked) --  cup (86 g) has 7.5 g of fiber. White beans or navy beans (cooked) --  cup (91 g) has 9.3 g of fiber. The items listed above may not be a complete list of foods with high fiber. Actual amounts of fiber may be different depending on processing. Contact a dietitian for more information. What foods are moderate in fiber? Fruits Banana -- 1 medium (126 g) has 3.2 g of fiber. Melon -- 1 cup (155 g) has 1.4 g of fiber. Orange -- 1 small (154 g) has 3.7 g of fiber. Raisins --  cup (40 g) has 1.8 g of fiber. Applesauce, sweetened --  cup (125  g) has 1.5 g of fiber. Blueberries (fresh) --  cup (75 g) has 1.8 g of fiber. Strawberries (fresh, sliced) -- 1 cup (361 g) has 3 g of fiber. Cherries -- 1 cup (140 g) has 2.9 g of fiber. Vegetables Broccoli (cooked) --  cup (77.5 g) has 2.1 g of fiber. Carrots (cooked) --  cup (77.5 g) has 2.2 g of fiber. Corn (canned or frozen) --  cup (82.5 g) has 2.1 g of fiber. Potatoes, mashed --  cup (105 g) has 1.6 g of fiber. Tomato -- 1 medium (62 g) has 1.5 g of fiber. Green beans (canned) --  cup (83 g) has 2 g of fiber. Squash, winter --  cup (58 g) has 1 g of fiber. Sweet potato, baked -- 1 medium (150 g) has 3 g of fiber. Cauliflower (cooked) -- 1/2 cup (90 g) has 2.3 g of fiber. Grains Long-grain brown rice (cooked) -- 1 cup (196 g) has 3.5 g of fiber. Bagel, plain -- one 4-inch (10 cm) bagel has 2 g of fiber. Instant oatmeal --  cup (120 g) has about 2 g of fiber. Macaroni noodles, enriched (cooked) -- 1 cup (140 g) has 2.5 g of fiber. Multigrain cereal --  cup (15 g) has about 2-4 g of fiber. Whole-wheat bread -- 1 slice (26 g) has 2 g of fiber. Whole-wheat spaghetti noodles --  cup (70 g) has 3.2 g of fiber. Corn tortilla -- one 6-inch (15 cm) tortilla has 1.5 g of fiber. Meats and other proteins Almonds --  cup or 1 oz (28 g) has 3.5 g of fiber. Sunflower seeds in shell --  cup or  oz (11.5 g) has 1.1 g of fiber. Vegetable or soy patty -- 1 patty (70 g) has 3.4 g of fiber. Walnuts --  cup or 1 oz (30 g) has 2 g of fiber. Flax seed -- 1 Tbsp (7 g) has 2.8 g of fiber. The items listed above may not be a complete list of foods that have moderate amounts of fiber. Actual amounts of fiber may be different depending on processing. Contact a dietitian for more information. What foods are low in fiber? Low-fiber foods contain less than 1 g of fiber per serving. They include: Fruits Fruit juice --  cup or 4 fl oz (118 mL) has 0.5 g of fiber. Vegetables Lettuce -- 1 cup (35  g) has 0.5 g of fiber. Cucumber (slices) --  cup (60 g) has 0.3 g of fiber. Celery -- 1 stalk (40 g) has 0.1 g of fiber. Grains Flour tortilla -- one 6-inch (15 cm) tortilla has 0.5 g of fiber.  White rice (cooked) --  cup (81.5 g) has 0.3 g of fiber. Meats and other proteins Egg -- 1 large (50 g) has 0 g of fiber. Meat, poultry, or fish -- 3 oz (85 g) has 0 g of fiber. Dairy Milk -- 1 cup or 8 fl oz (237 mL) has 0 g of fiber. Yogurt -- 1 cup (245 g) has 0 g of fiber. The items listed above may not be a complete list of foods that are low in fiber. Actual amounts of fiber may be different depending on processing. Contact a dietitian for more information. Summary Fiber is a substance that is found in plant foods, such as fruits, vegetables, whole grains, nuts, seeds, and beans. As part of your treatment and recovery plan, your health care provider may recommend that you eat foods that have specific amounts of dietary fiber. This information is not intended to replace advice given to you by your health care provider. Make sure you discuss any questions you have with your health care provider. Document Revised: 06/09/2019 Document Reviewed: 06/09/2019 Elsevier Patient Education  2022 [employer].

## 2021-01-09 ENCOUNTER — Telehealth: Payer: Self-pay | Admitting: Internal Medicine

## 2021-01-09 DIAGNOSIS — A6009 Herpesviral infection of other urogenital tract: Secondary | ICD-10-CM

## 2021-01-14 ENCOUNTER — Other Ambulatory Visit: Payer: Self-pay | Admitting: *Deleted

## 2021-01-14 NOTE — Progress Notes (Signed)
Medication updated per patient request

## 2021-01-14 NOTE — Telephone Encounter (Signed)
Patient is scheduled is for 01/16/2021. Asking for a limited supply. Stated she is completely out of medication.

## 2021-01-15 NOTE — Telephone Encounter (Signed)
Pt was informed that lab work is needed first. She expressed understanding and stated that she would bring a copy of her recent labs from cardiology tomorrow for her scheduled OV.

## 2021-01-16 ENCOUNTER — Ambulatory Visit (INDEPENDENT_AMBULATORY_CARE_PROVIDER_SITE_OTHER): Payer: Medicare Other | Admitting: Internal Medicine

## 2021-01-16 ENCOUNTER — Other Ambulatory Visit: Payer: Self-pay

## 2021-01-16 ENCOUNTER — Encounter: Payer: Self-pay | Admitting: Internal Medicine

## 2021-01-16 VITALS — BP 138/82 | HR 68 | Temp 98.6°F | Resp 16 | Ht 66.0 in | Wt 184.0 lb

## 2021-01-16 DIAGNOSIS — A6009 Herpesviral infection of other urogenital tract: Secondary | ICD-10-CM

## 2021-01-16 DIAGNOSIS — I10 Essential (primary) hypertension: Secondary | ICD-10-CM

## 2021-01-16 DIAGNOSIS — E559 Vitamin D deficiency, unspecified: Secondary | ICD-10-CM

## 2021-01-16 DIAGNOSIS — E2839 Other primary ovarian failure: Secondary | ICD-10-CM | POA: Insufficient documentation

## 2021-01-16 DIAGNOSIS — I2510 Atherosclerotic heart disease of native coronary artery without angina pectoris: Secondary | ICD-10-CM | POA: Diagnosis not present

## 2021-01-16 DIAGNOSIS — E785 Hyperlipidemia, unspecified: Secondary | ICD-10-CM

## 2021-01-16 DIAGNOSIS — Z23 Encounter for immunization: Secondary | ICD-10-CM

## 2021-01-16 LAB — LIPID PANEL
Cholesterol: 153 mg/dL (ref 0–200)
HDL: 42.1 mg/dL (ref >39.00)
NonHDL: 110.44
Total CHOL/HDL Ratio: 4
Triglycerides: 347 mg/dL — ABNORMAL HIGH (ref 0.0–149.0)
VLDL: 69.4 mg/dL — ABNORMAL HIGH (ref 0.0–40.0)

## 2021-01-16 LAB — URINALYSIS, ROUTINE W REFLEX MICROSCOPIC
Bilirubin Urine: NEGATIVE
Hgb urine dipstick: NEGATIVE
Ketones, ur: NEGATIVE
Leukocytes,Ua: NEGATIVE
Nitrite: NEGATIVE
RBC / HPF: NONE SEEN (ref 0–?)
Specific Gravity, Urine: >=1.03 — AB (ref 1.000–1.030)
Total Protein, Urine: NEGATIVE
Urine Glucose: NEGATIVE
Urobilinogen, UA: 0.2 (ref 0.0–1.0)
pH: 5.5 (ref 5.0–8.0)

## 2021-01-16 LAB — VITAMIN D 25 HYDROXY (VIT D DEFICIENCY, FRACTURES): VITD: 27.19 ng/mL — ABNORMAL LOW (ref 30.00–100.00)

## 2021-01-16 LAB — LDL CHOLESTEROL, DIRECT: Direct LDL: 73 mg/dL

## 2021-01-16 LAB — TSH: TSH: 4.24 u[IU]/mL (ref 0.35–5.50)

## 2021-01-16 MED ORDER — VALACYCLOVIR HCL 1 G PO TABS
1000.0000 mg | ORAL_TABLET | Freq: Two times a day (BID) | ORAL | 2 refills | Status: DC
Start: 2021-01-16 — End: 2021-01-23

## 2021-01-16 MED ORDER — SHINGRIX 50 MCG/0.5ML IM SUSR: 0.5000 mL | Freq: Once | INTRAMUSCULAR | 1 refills | Status: AC

## 2021-01-16 NOTE — Progress Notes (Signed)
Subjective:  Patient ID: Savannah [name], female    DOB: [birth year]  Age: 75 y.o. MRN: [id number]  CC: Hypertension  This visit occurred during the SARS-CoV-2 public health emergency.  Safety protocols were in place, including screening questions prior to the visit, additional usage of staff PPE, and extensive cleaning of exam room while observing appropriate contact time as indicated for disinfecting solutions.    HPI Savannah [name] presents for f/up -   She requests a refill on Valtrex.  She has a couple of genital herpes outbreaks per year.  Her last outbreak was 2 weeks ago and responded well to a several day course of the antiviral.  Outpatient Medications Prior to Visit  Medication Sig Dispense Refill   anastrozole (ARIMIDEX) 1 MG tablet Take 1 mg by mouth daily.     aspirin 81 MG tablet Take 81 mg by mouth.     b complex vitamins capsule Take 1 capsule by mouth 2 (two) times a week.     Calcium Carbonate (CALCIUM 600 PO) Take by mouth.     Cholecalciferol (D3 MAXIMUM STRENGTH PO) Take 5,000 Units by mouth 2 (two) times a week.      Coenzyme Q10 200 MG capsule Take 200 mg by mouth daily.     diphenhydrAMINE (BENADRYL) 25 mg capsule Take 25 mg by mouth every 6 (six) hours as needed.     ezetimibe (ZETIA) 10 MG tablet TAKE 1 TABLET BY MOUTH EVERY DAY (Patient taking differently: Take 10 mg by mouth 2 (two) times a week.) 90 tablet 3   furosemide (LASIX) 20 MG tablet TAKE 20 MG( 1/2 TABLET ) TO 40 MG ( 1 TABLET) AS NEEDED FOR FLUID OR EDEMA 180 tablet 1   icosapent Ethyl (VASCEPA) 1 g capsule Take 1 capsule (1 g total) by mouth 2 (two) times a week. 36 capsule 2   LIVALO 2 MG TABS TAKE 1 TABLET BY MOUTH TWICE A WEEK 90 tablet 3   loratadine (CLARITIN) 10 MG tablet Take 10 mg by mouth daily as needed for allergies.     losartan (COZAAR) 100 MG tablet Take 1 tablet (100 mg total) by mouth daily. Patients needs a appointment for future refills. 90 tablet 0   Magnesium 300 MG CAPS Take 300 mg by  mouth 3 (three) times a week.     Menaquinone-7 (VITAMIN K2 PO) Take 50 mcg by mouth 2 (two) times a week.      METOPROLOL TARTRATE PO Take 25 mg by mouth daily.     naproxen sodium (ALEVE) 220 MG tablet Take 220 mg by mouth as needed.      potassium chloride SA (KLOR-CON M20) 20 MEQ tablet Take 1 tablet (20 mEq total) by mouth daily as needed. When you take Furosemide (Lasix). 90 tablet 3   Vitamin E 45 MG CAPS 400 Units daily.      metoprolol succinate (TOPROL-XL) 25 MG 24 hr tablet TAKE 1 TABLET BY MOUTH EVERY DAY 90 tablet 1   valACYclovir (VALTREX) 1000 MG tablet TAKE 1 TABLET BY MOUTH EVERY DAY 30 tablet 2   No facility-administered medications prior to visit.    ROS Review of Systems  Constitutional:  Negative for diaphoresis and fatigue.  HENT: Negative.    Eyes: Negative.   Cardiovascular:  Negative for chest pain, palpitations and leg swelling.  Gastrointestinal:  Negative for abdominal pain, constipation and diarrhea.  Endocrine: Negative.   Genitourinary:  Positive for genital sores. Negative for difficulty urinating  and dysuria.  Musculoskeletal: Negative.   Skin: Negative.   Neurological:  Negative for dizziness, weakness and light-headedness.  Hematological:  Negative for adenopathy. Does not bruise/bleed easily.  Psychiatric/Behavioral: Negative.     Objective:  BP 138/82 (BP Location: Right Arm, Patient Position: Sitting, Cuff Size: Large)   Pulse 68   Temp 98.6 F (37 C) (Oral)   Resp 16   Ht 5\' 6"  (1.676 m)   Wt 184 lb (83.5 kg)   SpO2 94%   BMI 29.70 kg/m   BP Readings from Last 3 Encounters:  01/16/21 138/82  01/08/21 118/78  01/26/20 115/70    Wt Readings from Last 3 Encounters:  01/16/21 184 lb (83.5 kg)  01/08/21 182 lb (82.6 kg)  01/26/20 189 lb (85.7 kg)    Physical Exam Vitals reviewed.  HENT:     Mouth/Throat:     Mouth: Mucous membranes are moist.  Eyes:     Conjunctiva/sclera: Conjunctivae normal.  Cardiovascular:     Rate and  Rhythm: Normal rate and regular rhythm.     Heart sounds: No murmur heard. Pulmonary:     Effort: Pulmonary effort is normal.     Breath sounds: No stridor. No wheezing, rhonchi or rales.  Abdominal:     General: Abdomen is flat.     Palpations: There is no mass.     Tenderness: There is no abdominal tenderness. There is no guarding.     Hernia: No hernia is present.  Musculoskeletal:        General: Normal range of motion.     Cervical back: Neck supple.     Right lower leg: No edema.     Left lower leg: No edema.  Skin:    General: Skin is warm and dry.  Neurological:     General: No focal deficit present.     Mental Status: She is alert.  Psychiatric:        Mood and Affect: Mood normal.        Behavior: Behavior normal.    Lab Results  Component Value Date   WBC 6.6 10/16/2020   HGB 14.3 10/16/2020   HCT 43 10/16/2020   PLT 265 10/16/2020   GLUCOSE 97 01/09/2020   CHOL 146 01/09/2020   TRIG 200 (H) 01/09/2020   HDL 48 01/09/2020   LDLDIRECT 94.0 12/24/2017   LDLCALC 65 01/09/2020   ALT 25 10/16/2020   AST 18 10/16/2020   NA 141 10/16/2020   K 4.3 10/16/2020   CL 105 10/16/2020   CREATININE 0.9 10/16/2020   BUN 22 (A) 10/16/2020   CO2 27 (A) 10/16/2020   TSH 4.060 01/09/2020    CT Chest High Resolution  Result Date: 02/09/2020 CLINICAL DATA:  Interstitial lung disease EXAM: CT CHEST WITHOUT CONTRAST TECHNIQUE: Multidetector CT imaging of the chest was performed following the standard protocol without intravenous contrast. High resolution imaging of the lungs, as well as inspiratory and expiratory imaging, was performed. COMPARISON:  12/07/2018 FINDINGS: Cardiovascular: Scattered aortic atherosclerosis. Normal heart size. Three-vessel coronary artery calcifications. No pericardial effusion. Mediastinum/Nodes: No enlarged mediastinal, hilar, or axillary lymph nodes. Thyroid gland, trachea, and esophagus demonstrate no significant findings. Lungs/Pleura: No  significant change in minimal irregular peripheral interstitial opacity at the lung bases (series 4, image 261). Unchanged mild, lobular air trapping on expiratory phase imaging. No pleural effusion or pneumothorax. Upper Abdomen: No acute abnormality. Musculoskeletal: No chest wall mass or suspicious bone lesions identified. IMPRESSION: 1. No significant change in minimal  irregular peripheral interstitial opacity at the lung bases. Findings are nonspecific and could reflect minimal fibrotic interstitial lung disease or bland sequelae of prior infection or aspiration. If characterized by ATS pulmonary fibrosis criteria, these are in an early "indeterminate for UIP" pattern. Consider ongoing annual follow-up CT to assess for stability of fibrotic findings and pattern if there is a high clinical suspicion for fibrotic interstitial lung disease. Findings are indeterminate for UIP per consensus guidelines: Diagnosis of Idiopathic Pulmonary Fibrosis: An Official ATS/ERS/JRS/ALAT Clinical Practice Guideline. Time, Iss 5, [phone], Oct 18 2016. 2. Unchanged mild, lobular air trapping on expiratory phase imaging, consistent with small airways disease. 3. Coronary artery disease. Aortic Atherosclerosis (ICD10-I70.0). Electronically Signed   By: Eddie Candle M.D.   On: 02/09/2020 14:08    Assessment & Plan:   Jaculin was seen today for hypertension.  Diagnoses and all orders for this visit:  Primary hypertension- Her BP is adequately well controlled. -     Cancel: CBC with Differential/Platelet; Future -     Cancel: Basic metabolic panel; Future -     Cancel: Hepatic function panel; Future -     TSH; Future -     Urinalysis, Routine w reflex microscopic; Future -     Urinalysis, Routine w reflex microscopic -     TSH -     Cancel: Hepatic function panel -     Cancel: Basic metabolic panel -     Cancel: CBC with Differential/Platelet  Herpes genitalis in women -     valACYclovir  (VALTREX) 1000 MG tablet; Take 1 tablet (1,000 mg total) by mouth 2 (two) times daily.  Vitamin D deficiency -     VITAMIN D 25 Hydroxy (Vit-D Deficiency, Fractures); Future -     VITAMIN D 25 Hydroxy (Vit-D Deficiency, Fractures)  Dyslipidemia, goal LDL below 160 -     Lipid panel; Future -     Cancel: Hepatic function panel; Future -     TSH; Future -     TSH -     Cancel: Hepatic function panel -     Lipid panel  Need for shingles vaccine -     Zoster Vaccine Adjuvanted [facility]) injection; Inject 0.5 mLs into the muscle once for 1 dose.  Estrogen deficiency  Other orders -     Pneumococcal polysaccharide vaccine 23-valent greater than or equal to 2yo subcutaneous/IM  I have discontinued [facility]'s metoprolol succinate. I have also changed her valACYclovir. Additionally, I am having her start on Shingrix. Lastly, I am having her maintain her aspirin, Menaquinone-7 (VITAMIN K2 PO), loratadine, naproxen sodium, Cholecalciferol (D3 MAXIMUM STRENGTH PO), Coenzyme Q10, diphenhydrAMINE, Vitamin E, Livalo, potassium chloride SA, ezetimibe, Magnesium, b complex vitamins, furosemide, losartan, icosapent Ethyl, anastrozole, Calcium Carbonate (CALCIUM 600 PO), and METOPROLOL TARTRATE PO.  Meds ordered this encounter  Medications   valACYclovir (VALTREX) 1000 MG tablet    Sig: Take 1 tablet (1,000 mg total) by mouth 2 (two) times daily.    Dispense:  30 tablet    Refill:  2   Zoster Vaccine Adjuvanted [facility]) injection    Sig: Inject 0.5 mLs into the muscle once for 1 dose.    Dispense:  0.5 mL    Refill:  1      Follow-up: Return in about 6 months (around 07/16/2021).  Scarlette Calico, MD

## 2021-01-16 NOTE — Patient Instructions (Signed)
Genital Herpes Genital herpes is a common sexually transmitted infection (STI) that is caused by a virus. The virus spreads from person to person through sexual contact. The infection can cause itching, blisters, and sores around the genitals or rectum. Symptoms may last for several days and then go away. This is called an outbreak. The virus remains in the body, however, so more outbreaks may happen in the future. The time between outbreaks varies and can be from months to years. Genital herpes can affect anyone. It is particularly concerning for pregnant women because the virus can be passed to the baby during delivery and can cause serious problems. Genital herpes is also a concern for people who have a weak disease-fighting system (immune system). What are the causes? This condition is caused by the human herpesvirus, also called herpes simplex virus, type 1 or type 2 (HSV-1 or HSV-2). The virus may spread through: Sexual contact with an infected person, including vaginal, anal, and oral sex. Contact with fluid from a herpes sore. The skin. This means that you can get herpes from an infected partner even if there are no blisters or sores present. Your partner may not know that he or she is infected. What increases the risk? You are more likely to develop this condition if: You have sex with many partners. You do not use latex condoms during sex. What are the signs or symptoms? Most people do not have symptoms (are asymptomatic), or they have mild symptoms that may be mistaken for other skin problems. Symptoms may include: Small, red bumps near the genitals, rectum, or mouth. These bumps turn into blisters and then sores. Flu-like symptoms, including: Fever. Body aches. Swollen lymph nodes. Headache. Painful urination. Pain and itching in the genital area or rectal area. Vaginal discharge. Tingling or shooting pain in the legs and buttocks. Generally, symptoms are more severe and last  longer during the first (primary) outbreak. Flu-like symptoms are also more common during the primary outbreak. How is this diagnosed? This condition may be diagnosed based on: A physical exam. Your medical history. Blood tests. A test of a fluid sample (culture) from an open sore. How is this treated? There is no cure for this condition, but treatment with antiviral medicines that are taken by mouth (orally) can do the following: Speed up healing and relieve symptoms. Help to reduce the spread of the virus to sexual partners. Limit the chance of future outbreaks, or make future outbreaks shorter. Lessen symptoms of future outbreaks. Your health care provider may also recommend pain relief medicines, such as aspirin or ibuprofen. Follow these instructions at home: If you have an outbreak: Keep the affected areas dry and clean. Avoid rubbing or touching blisters and sores. If you do touch blisters or sores: Wash your hands thoroughly with soap and water. Do not touch your eyes afterward. To help relieve pain or itching, you may take the following actions as told by your health care provider: Apply a cold, wet cloth (cold compress) to affected areas 4-6 times a day. Apply a substance that protects your skin and reduces bleeding (astringent). Apply a gel that helps relieve pain around sores (lidocaine gel). Take a warm, shallow bath that cleans the genital area (sitz bath). Sexual activity Do not have sexual contact during active outbreaks. Practice safe sex. Herpes can spread even if your partner does not have blisters or sores. Latex condoms and female condoms may help prevent the spread of the herpes virus. General instructions Take over-the-counter and  prescription medicines only as told by your health care provider. Keep all follow-up visits as told by your health care provider. This is important. How is this prevented? Use condoms. Although you can get genital herpes during sexual  contact even with the use of a condom, a condom can provide some protection. Avoid having multiple sexual partners. Talk with your sexual partner about any symptoms either of you may have. Also, talk with your partner about any history of STIs. Get tested for STIs before you have sex. Ask your partner to do the same. Do not have sexual contact if you have active symptoms of genital herpes. Contact a health care provider if: Your symptoms are not improving with medicine. Your symptoms return, or you have new symptoms. You have a fever. You have abdominal pain. You have redness, swelling, or pain in your eye. You notice new sores on other parts of your body. You are a woman and you experience bleeding between menstrual periods. You have had herpes and you become pregnant or plan to become pregnant. Summary Genital herpes is a common sexually transmitted infection (STI) that is caused by the herpes simplex virus, type 1 or type 2 (HSV-1 or HSV-2). These viruses are most often spread through sexual contact with an infected person. You are more likely to develop this condition if you have sex with many partners or you do not use condoms during sex. Most people do not have symptoms (are asymptomatic) or have mild symptoms that may be mistaken for other skin problems. Symptoms occur as outbreaks that may happen months or years apart. There is no cure for this condition, but treatment with oral antiviral medicines can reduce symptoms, reduce the chance of spreading the virus to a partner, prevent future outbreaks, or shorten future outbreaks. This information is not intended to replace advice given to you by your health care provider. Make sure you discuss any questions you have with your health care provider. Document Revised: 10/15/2020 Document Reviewed: 10/14/2018 Elsevier Patient Education  [place].

## 2021-01-23 ENCOUNTER — Ambulatory Visit (INDEPENDENT_AMBULATORY_CARE_PROVIDER_SITE_OTHER): Payer: Medicare Other | Admitting: Internal Medicine

## 2021-01-23 ENCOUNTER — Other Ambulatory Visit: Payer: Self-pay

## 2021-01-23 ENCOUNTER — Other Ambulatory Visit: Payer: Self-pay | Admitting: Internal Medicine

## 2021-01-23 ENCOUNTER — Encounter: Payer: Self-pay | Admitting: Internal Medicine

## 2021-01-23 VITALS — BP 126/72 | HR 74 | Ht 66.25 in | Wt 183.6 lb

## 2021-01-23 DIAGNOSIS — J849 Interstitial pulmonary disease, unspecified: Secondary | ICD-10-CM

## 2021-01-23 DIAGNOSIS — I2510 Atherosclerotic heart disease of native coronary artery without angina pectoris: Secondary | ICD-10-CM | POA: Diagnosis not present

## 2021-01-23 DIAGNOSIS — I10 Essential (primary) hypertension: Secondary | ICD-10-CM | POA: Diagnosis not present

## 2021-01-23 DIAGNOSIS — M349 Systemic sclerosis, unspecified: Secondary | ICD-10-CM

## 2021-01-23 DIAGNOSIS — M359 Systemic involvement of connective tissue, unspecified: Secondary | ICD-10-CM

## 2021-01-23 DIAGNOSIS — J8489 Other specified interstitial pulmonary diseases: Secondary | ICD-10-CM | POA: Diagnosis not present

## 2021-01-23 DIAGNOSIS — A6009 Herpesviral infection of other urogenital tract: Secondary | ICD-10-CM

## 2021-01-23 LAB — PULMONARY FUNCTION TEST
DL/VA % pred: 79 %
DL/VA: 3.2 ml/min/mmHg/L
DLCO cor % pred: 76 %
DLCO cor: 15.68 ml/min/mmHg
DLCO unc % pred: 76 %
DLCO unc: 15.68 ml/min/mmHg
FEF 25-75 Pre: 1.83 L/sec
FEF2575-%Pred-Pre: 105 %
FEV1-%Pred-Pre: 101 %
FEV1-Pre: 2.34 L
FEV1FVC-%Pred-Pre: 101 %
FEV6-%Pred-Pre: 105 %
FEV6-Pre: 3.06 L
FEV6FVC-%Pred-Pre: 104 %
FVC-%Pred-Pre: 100 %
FVC-Pre: 3.07 L
Pre FEV1/FVC ratio: 76 %
Pre FEV6/FVC Ratio: 100 %

## 2021-01-23 NOTE — Progress Notes (Signed)
Spirometry and dlco done today. 

## 2021-01-23 NOTE — Progress Notes (Signed)
IOV 12/10/2015 - history some the patient and review of the outside records from Dr. Bronson Curb in cardiology office notes 11/19/2015 Dr. Haroldine Laws  Chief Complaint  Patient presents with   Pulmonary Consult    Referred by Dr Estanislado Pandy- Scleroderma - Had HRCT 11/22/15- SOB with walking up driveway or up steps - Denies cough or wheezing    75 year old female was diagnosed with scleroderma in 2008 when she was 75 years old. Notice many problems with the skin in her fingers and the form of a sclerodactyly. I According to her history and referral notes she did have a biopsy with Dr. Wilhemina Bonito that confirmed the scleroderma diagnosis     In 2008 she had dyspnea. She had a cardiac stress test at that time she had a Myoview for stress ejection fraction was 81% but resting echo showe d ejection fraction of 45%. She does not remember this but this is per cardiology notes. He tells me now that she's had insidious onset of dyspnea for the last few years. It has been steadily progressive. It is moderate in intensity. She notices this when she climbs stairs or goes to [employer]. It is relieved by rest. It is not associated with any chest pain. This no associated cough or edema or orthopnea paroxysmal nocturnal dyspnea presyncope. According to the cardiology notes she snores a lot.  She had echocardiogram this fall 2017 document below that shows grade 1 diastolic dysfunction with good left ventricular ejection fraction. Right ventricular pump function is normal. No comment on pulmonary systolic pressures.  She underwent pulmonary function test that shows isolated reduction in diffusion capacity documented below. She then underwent high-resolution CT chest that I personally visualized shows some faint groundglass opacities. Therefore she has been referred here.  She tells me that for her scleroderma she's been treated with methotrexate in The past. She Does Have the Details. She Says This Did Not Help Her.  Overall She Is a Nonsmoker. She Drinks Alcohol Socially. She Does Walking for Exercise but Does Have Right Knee Joint Discomfort. Rheumatology Notes Indicate Significant Issues with Osteoarthritis Especially of the Right Knee Is Documented in Office Visits in November 2016 and Subsequently.Also arthritis is also documented in her hand joints   Tests below  Pulmonary function test 11/09/2015 FVC 2.4 L/1%, FEV1 2.6 L/104%. DLCO 5.3/95% and DLCO 17.03/60%. She has isolated reduction in diffusion capacity  IMPRESSION: HRCT 11/22/15 1. Mild patchy subpleural ground-glass attenuation and reticulation in both lungs, most prominent in the lower lobes. No significant traction bronchiectasis. No frank honeycombing. 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 A follow-up high-resolution chest CT study in 12 months would be useful to assess temporal pattern stability. 2. Additional findings include aortic atherosclerosis, three-vessel coronary atherosclerosis and possible 0.9 cm lower left thyroid lobe nodule.     Electronically Signed   By: [name] M.D.   On: 11/22/2015 13:14  Walking desaturation test 185 feet 3 laps on room air today in the office 12/10/2015: Resting pulse ox was 94% after 3 laps pulse ox was 98%. She had some pain with her right knee where she had no dyspnea. Resting heart rate was 87/m Savannah to 93/m  Echocardiogram 11/19/2015 shows grade 1 diastolic dysfunction but otherwise reported as normal. Including right ventricle    OV 10/09/2016  Chief Complaint  Patient presents with     Follow-up    Pt states she feels she is not able to  breathe in enough air. Pt denies significant cough. Pt states she has chest tightness at random times. Pt denies f/c/s.    Follow-up scleroderma with ILD associated with mild reduction in diffusion capacity - mild severity of disease on observation therapy  Last seen October  2017. Since then she's been on observation therapy. She had pulmonary function testing in June 2018 that in the last 2 years is showing mild decline in diffusion capacity. She tells me that correlating with this she is feeling more shortness of breath with exertion and even at rest she feels that she has to take a deep breath. There is mild associated cough occasionally but there is no wheeze. There is no fever or edema. Otherwise feeling well. She has some questions about getting pneumonia vaccine. Apparently she's getting an alert about this. Review of the chart shows that she is up-to-date with both Pneumovax and Prevnar within 5 years  Walking desaturation test 185 feet 3 laps on room air: Walking desaturation test on 10/09/2016 185 feet x 3 laps on RA :  did NOT  desaturate. Rest pulse ox was 98%, final pulse ox was 98%. HR response 83/min at rest to 98/min at peak exertion.    OV 12/17/2016  Chief Complaint  Patient presents with   Follow-up    pft done today and HRCT done 10/23. 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 This continues unchanged. Within the staging workup high-resolution CT scan of the chest shows mild subtle interstitial lung disease that is unchanged compared to one year ago. However on pulmonary function tests this seems to be a trend was decline in both FVC and DLCO. If there is worsening interstitial lung disease this is not reflected on the CT scan [as yet] we also look for pulmonary hypertension with echocardiogram which she had October 2018 shows grade 1 diastolic dysfunctionbut no documentation of pulmonary hypertension. She has never been to pulmonary rehabilitation because of her knee issues she is a bit deconditioned. She is interested in pulmonary rehabilitation but she wants to get this done in  [place] where she lives between January and May 2019    IMPRESSION: compared to oct 2017 1. 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 2. Aortic atherosclerosis, in addition to left main and 3 vessel coronary artery disease. Please note that although the presence of coronary artery calcium documents the presence of coronary artery disease, the severity of this disease and any potential stenosis cannot be assessed on this non-gated CT examination. Assessment for potential risk factor modification, dietary therapy or pharmacologic therapy may be warranted, if clinically indicated.   Aortic Atherosclerosis (ICD10-I70.0).     Electronically Signed   By: Vinnie Langton M.D.   On: 12/09/2016 12:07   OV 12/16/2017  Subjective:  Patient ID: Savannah [name], female , DOB: 04-11-1945 , age 46 y.o. , MRN: [id number] , ADDRESS: [address] [place] [phone]   12/16/2017 -   Chief Complaint  Patient presents with   Follow-up    shortness of breath     HPI Savannah [name] 75 y.o. 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  Stable over time.  She returns for follow-up.  She spent the time from January  through September in [place].  She was supposed to return in July but her return got delayed.  Therefore she is here for this follow-up visit in October 2019.  And once again in January 2020 all the way through July 2020 she will be back in [place] in [place].  At this point in time she is continues to do well without any worsening shortness of breath.  She can do activities of daily living and climb hills and stairs without a problem.  Although she does feel that the edema in her fingers is slowly getting worse particularly in the right hand.  There is no chest pain or any other side effects.  She had pulmonary function test today that  shows continued stability over 3-1/2 years.      OV 01/17/2019  Subjective:  Patient ID: Savannah [name], female , DOB: Aug 04, 1945 , age 25 y.o. , MRN: [id number] , ADDRESS: [address] [place] [phone]   01/17/2019 -   Chief Complaint  Patient presents with   Follow-up    Pt states she has been doing good since last visit. Pt denies any complaints of any cough and states SOB is about the same since last visit.   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  HPI Savannah [name] 75 y.o. -she returns for 1 year follow-up.  She says overall she is stable in terms of her shortness of breath.  She had a high-resolution CT chest which is a 2-year follow-up.  Reported as stable.  Her symptom scores are listed below.  She did see cardiology Dr. Haroldine Laws who has been reassured with her echocardiogram.  She does have coronary artery calcification he is aware of this.  She did have pulmonary function test today and her DLCO seems fluctuant but stable and a slight decline in her FVC.  She is surprised by this because overall she feels stable.  She is not had a flu shot and she defers a flu shot every year.  She is asking about the Covid vaccine.  She is social distancing and does not spend her time with any human clusyer r of more than 2 people over the holiday weekend.   OV 01/25/2020   Subjective:  Patient ID: Savannah [name], female , DOB: 06-Mar-1945, age 38 y.o. years. , MRN: [id number],  ADDRESS: [address] [place] [address] PCP  Janith Lima, MD Providers : Treatment Team:  Attending Provider: Brand Males, MD Patient Care Team: Janith Lima, MD as PCP - General (Internal Medicine)    Chief Complaint  Patient presents with   Follow-up    PFT performed 11/30.  Pt states she has been doing good since last visit and denies any complaints.     Follow-up mild interstitial lung disease in the setting of scleroderma: On  observation therapy  HPI Savannah [name] 75 y.o. -last seen in November 2020.  After that she was supposed to come in the summer 2021.  However she lives part-time in [place] husband undergoes physical therapy.  They have a condominium there.  Therefore she has not been able to attend.  From a symptom standpoint she says she is stable.  She wants to the point rehabilitation in [place].  She wants a referral.  She has a rheumatology appointment tomorrow.  She saw Dr. Haroldine Laws cardiology yesterday.  Echocardiogram reviewed did not show any pulmonary hypertension.  2-year follow-up has been given for  her.  She is very active.  Current symptom scores are listed below.  She did pulmonary function test.  This is documented below in my view it is showing steady slow decline in both diffusion and capacity.  However symptomatically she does not feel this.  But looking at the ILD symptom score below symptoms seem progressive based on numbers.   Results for NASEEM, ADLER (MRN [id number]) as of 12/17/2016 11:16  Ref. Range 08/08/2014 09:58 11/09/2015 10:48 08/04/2016 10:57 12/17/2016 09:43 12/16/2017  12/29/2018 01/25/2020   FVC-Pre Latest Units: L 3.36 3.39 3.24 3.21 3.26 3.15 3.02  FVC-%Pred-Pre Latest Units: % 99 101 98 97 98 100% 97%   Results for KARLEA, MCKIBBIN (MRN [id number]) as of 12/17/2016 11:16  Ref. Range 08/08/2014 09:58 11/09/2015 10:48 08/04/2016 10:57 12/17/2016 09:43 12/16/2017  01/17/2019 GLI equation 01/25/2020   DLCO unc Latest Units: ml/min/mmHg 18.83 17.03 16.89 16.59 18.03 16.54 15.52  DLCO unc % pred Latest Units: % 66 60 59 58 63 79% 75%     HRCT compared to Oct 2018 -> OCt 2020 IMPRESSION: 1. Moderate patchy air trapping, indicative of small airways disease, unchanged. 2. Minimal patchy subpleural reticulation and ground-glass attenuation in the lungs, slightly less prominent in the interval. Stable scattered mild cylindrical bronchiolectasis in the mid to lower lungs.  No progressive findings. Findings could represent a very mild nonspecific interstitial pneumonia (NSIP). Findings are not compatible with usual interstitial pneumonia (UIP). Findings are suggestive of an alternative diagnosis (not UIP) per consensus guidelines: Diagnosis of Idiopathic Pulmonary Fibrosis: An Official ATS/ERS/JRS/ALAT Clinical Practice Guideline. [place], Iss 5, [phone], Oct 18 2016. 3. Three-vessel coronary atherosclerosis.   Aortic Atherosclerosis (ICD10-I70.0).     Electronically Signed   By: [name] M.D.   On: 12/07/2018 15:43  ROS - per HPI   OV 01/23/2021  Subjective:  Patient ID: Savannah [name], female , DOB: 09-30-1945 , age 48 y.o. , MRN: [id number] , ADDRESS: [address] [place] [address] PCP Janith Lima, MD Patient Care Team: Janith Lima, MD as PCP - General (Internal Medicine) Troy Sine, MD as PCP - Cardiology (Cardiology)  This Provider for this visit: Treatment Team:  Attending Provider: Brand Males, MD    01/23/2021 -   Chief Complaint  Patient presents with   Follow-up    F/U after PFT. States she has been doing well since last visit.      Follow-up mild interstitial lung disease in the setting of scleroderma: On observation therapy  HPI Savannah [name] 75 y.o. -returns for 1 year follow-up.  She spends summers in [place] and winters here.  Over the middle of 2022 she developed right breast cancer and had right-sided lumpectomy in [facility] the [place].  From a dyspnea standpoint she continues to be stable.  She had pulmonary function testing.  Her FVC is normal but nevertheless shows a decline.  Her DLCO is just below 80% and still shows overall trend towards decline.  But she feels the same.  Her last echocardiogram was a year ago.  Dr. Haroldine Laws will see her on every 2 years.  Her last CT scan of the chest was 1 year ago.  We discussed the fact  scleroderma ILD can be progressive.  Discussed the fact that if there is at least 10% ILD on the CT than his indication for antifibrotic.  Also discussed the fact progressive phenotype is indication for  antifibrotic.  She is open to this idea.  She is agreeable to get CT scan of the chest   SYMPTOM SCALE - ILD 01/17/2019  01/25/2020  01/23/2021   O2 use ra ra ra  Shortness of Breath 0 -> 5 scale with 5 being worst (score 6 If unable to do)    At rest 0 1 0  Simple tasks - showers, clothes change, eating, shaving 0 1 1  Household (dishes, doing bed, laundry) 2 3 1   Shopping 2 2 1   Walking level at own pace 2 2 2   Walking up Stairs 3 3 2   Total (40 - 48) Dyspnea Score 9 12 7   How bad is your cough? 0  1 0  How bad is your fatigue 2  2 1   Nausea   1 0  Vomiting   0 0  Diarrhea   0 00  Anxiety   1 0  Depression   2 0      Simple office walk 185 feet x  3 laps goal with forehead probe 12/16/2017  01/17/2019  01/23/2021    O2 used Room air Room air ra  Number laps completed 3 3 3   Comments about pace normal avg pace   Resting Pulse Ox/HR 98% and 70/min 100% and 82/min 98% and HR 74  Final Pulse Ox/HR 99% and 90/min 98% and 99/min 95% and HR 92  Desaturated </= 88% no no no  Desaturated <= 3% points no no Yes 3 poitns  Got Tachycardic >/= 90/min yes yes yes  Symptoms at end of test x nomne Mild chest tightness  Miscellaneous comments x x     CT Chest data  No results found.    PFT  PFT Results Latest Ref Rng & Units 01/23/2021 01/17/2020 12/29/2018 12/16/2017 12/17/2016 08/04/2016 11/09/2015  FVC-Pre L 3.07 3.02 3.15 3.21 3.21 3.24 3.39  FVC-Predicted Pre % 100 97 100 98 97 98 101  FVC-Post L - - 3.15 - - 3.03 3.30  FVC-Predicted Post % - - 100 - - 91 99  Pre FEV1/FVC % % 76 75 76 77 79 79 78  Post FEV1/FCV % % - - 80 - - 83 81  FEV1-Pre L 2.34 2.27 2.38 2.48 2.53 2.55 2.65  FEV1-Predicted Pre % 101 97 100 100 101 101 104  FEV1-Post L - - 2.52 - - 2.50 2.67  DLCO  uncorrected ml/min/mmHg 15.68 15.75 16.54 18.01 16.59 16.89 17.03  DLCO UNC% % 76 76 79 63 58 59 60  DLCO corrected ml/min/mmHg 15.68 15.52 - - 17.02 16.84 -  DLCO COR %Predicted % 76 75 - - 60 59 -  DLVA Predicted % 79 80 82 72 66 66 66  TLC L - - 5.05 - - 5.11 5.28  TLC % Predicted % - - 93 - - 92 95  RV % Predicted % - - 79 - - 76 80       has a past medical history of Dyspnea, Hyperlipidemia, Hypertension, Osteoarthritis, Swelling of extremity, and TIA (transient ischemic attack).   reports that she has never smoked. She has never used smokeless tobacco.  Past Surgical History:  Procedure Laterality Date   TONSILLECTOMY      Allergies  Allergen Reactions   Codeine Nausea Only    Other reaction(s): Unknown   Cymbalta [Duloxetine Hcl] Hives   Lyrica [Pregabalin] Hives   Penicillins    Sulfa Antibiotics Hives   Zetia [Ezetimibe] Other (See Comments)    Myalgia  Zocor [Simvastatin] Other (See Comments)    Myalgia    Immunization History  Administered Date(s) Administered   Fluad Quad(high Dose 65+) 01/24/2019   Influenza, High Dose Seasonal PF 10/17/2019   PFIZER(Purple Top)SARS-COV-2 Vaccination 03/13/2019, 04/05/2019, 01/24/2020   Pneumococcal Conjugate-13 11/20/2014   Pneumococcal Polysaccharide-23 11/17/2013, 01/16/2021   Tdap 07/29/2016   Zoster, Live 12/09/2009    Family History  Problem Relation Age of Onset   Hyperlipidemia Father    Cirrhosis Father 3   Kidney failure Father 81   Stroke Maternal Grandmother 85   Hypertension Maternal Grandmother 40   Angina Maternal Grandfather 56   Heart attack Maternal Grandfather 88   Lung disease Paternal Grandmother    Atrial fibrillation Mother 58   Hypertension Mother      Current Outpatient Medications:    anastrozole (ARIMIDEX) 1 MG tablet, Take 1 mg by mouth daily., Disp: , Rfl:    aspirin 81 MG tablet, Take 81 mg by mouth., Disp: , Rfl:    b complex vitamins capsule, Take 1 capsule by mouth 2 (two)  times a week., Disp: , Rfl:    Calcium Carbonate (CALCIUM 600 PO), Take by mouth 3 (three) times a week., Disp: , Rfl:    Cholecalciferol (D3 MAXIMUM STRENGTH PO), Take 5,000 Units by mouth 2 (two) times a week. , Disp: , Rfl:    Coenzyme Q10 200 MG capsule, Take 200 mg by mouth daily., Disp: , Rfl:    ezetimibe (ZETIA) 10 MG tablet, TAKE 1 TABLET BY MOUTH EVERY DAY (Patient taking differently: Take 10 mg by mouth 2 (two) times a week.), Disp: 90 tablet, Rfl: 3   furosemide (LASIX) 20 MG tablet, TAKE 20 MG( 1/2 TABLET ) TO 40 MG ( 1 TABLET) AS NEEDED FOR FLUID OR EDEMA, Disp: 180 tablet, Rfl: 1   icosapent Ethyl (VASCEPA) 1 g capsule, Take 1 capsule (1 g total) by mouth 2 (two) times a week., Disp: 36 capsule, Rfl: 2   LIVALO 2 MG TABS, TAKE 1 TABLET BY MOUTH TWICE A WEEK, Disp: 90 tablet, Rfl: 3   loratadine (CLARITIN) 10 MG tablet, Take 10 mg by mouth daily as needed for allergies., Disp: , Rfl:    losartan (COZAAR) 100 MG tablet, Take 1 tablet (100 mg total) by mouth daily. Patients needs a appointment for future refills., Disp: 90 tablet, Rfl: 0   Magnesium 300 MG CAPS, Take 300 mg by mouth 3 (three) times a week., Disp: , Rfl:    Menaquinone-7 (VITAMIN K2 PO), Take 50 mcg by mouth 2 (two) times a week. , Disp: , Rfl:    METOPROLOL TARTRATE PO, Take 25 mg by mouth daily., Disp: , Rfl:    potassium chloride SA (KLOR-CON M20) 20 MEQ tablet, Take 1 tablet (20 mEq total) by mouth daily as needed. When you take Furosemide (Lasix)., Disp: 90 tablet, Rfl: 3   valACYclovir (VALTREX) 1000 MG tablet, TAKE 1 TABLET BY MOUTH TWICE A DAY, Disp: 30 tablet, Rfl: 2   vitamin E 180 MG (400 UNITS) capsule, Take 400 Units by mouth daily., Disp: , Rfl:       Objective:   Vitals:   01/23/21 1614  BP: 126/72  Pulse: 74  SpO2: 98%  Weight: 183 lb 9.6 oz (83.3 kg)  Height: 5' 6.25" (1.683 m)    Estimated body mass index is 29.41 kg/m as calculated from the following:   Height as of this encounter: 5'  6.25" (1.683 m).   Weight as of  this encounter: 183 lb 9.6 oz (83.3 kg).  @WEIGHTCHANGE @  [employer]   01/23/21 1614  Weight: 183 lb 9.6 oz (83.3 kg)     Physical Exam  General: No distress. Looks well Neuro: Alert and Oriented x 3. GCS 15. Speech normal Psych: Pleasant Resp:  Barrel Chest - no.  Wheeze - no, Crackles - yes mild, No overt respiratory distress CVS: Normal heart sounds. Murmurs - no Ext: Stigmata of Connective Tissue Disease - SCLERODERMA HEENT: Normal upper airway. PEERL +. No post nasal drip        Assessment:       ICD-10-CM   1. 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 Scleroderma ([place])  M34.9          Plan:     Patient Instructions     ICD-10-CM   1. Interstitial lung disease due to connective tissue disease ([place])  J84.89    M35.9   2. Scleroderma ([place])  M34.9    Glad you are feeling well and recovered from breast cancer.  Symptoms also stble over 2 years  However on pulmonary function test even though it appears normal this seems to be a slow steady reduction in both capacity and diffusion.  This could be age-related but I am also concerned it could reflect worsening pulmonary fibrosis  Plan --Do high-resolution CT chest supine and prone in the next few to several weeks  - do ECHO in next few to severa weeks  Follow-up -Jan 2023/feb 2023 with Dr Chase Caller - virtual or face to face to review results   - 30 min if face to face    SIGNATURE    Dr. Brand Males, M.D., F.C.C.P,  Pulmonary and Critical Care Medicine Staff Physician, [place] Director - Interstitial Lung Disease  Program  Pulmonary [place] at [place], [place], [address]  Pager: [phone], If no answer or between  15:00h - 7:00h: call [phone] Telephone: [phone]  5:06 PM 01/23/2021

## 2021-01-23 NOTE — Patient Instructions (Addendum)
ICD-10-CM   1. Interstitial lung disease due to connective tissue disease ([place])  J84.89    M35.9   2. Scleroderma ([place])  M34.9    Glad you are feeling well and recovered from breast cancer.  Symptoms also stble over 2 years  However on pulmonary function test even though it appears normal this seems to be a slow steady reduction in both capacity and diffusion.  This could be age-related but I am also concerned it could reflect worsening pulmonary fibrosis  Plan --Do high-resolution CT chest supine and prone in the next few to several weeks  - do ECHO in next few to severa weeks  Follow-up -Jan 2023/feb 2023 with Dr Chase Caller - virtual or face to face to review results   - 30 min if face to face

## 2021-01-31 DIAGNOSIS — L821 Other seborrheic keratosis: Secondary | ICD-10-CM | POA: Diagnosis not present

## 2021-01-31 DIAGNOSIS — Z85828 Personal history of other malignant neoplasm of skin: Secondary | ICD-10-CM | POA: Diagnosis not present

## 2021-01-31 DIAGNOSIS — L820 Inflamed seborrheic keratosis: Secondary | ICD-10-CM | POA: Diagnosis not present

## 2021-01-31 DIAGNOSIS — D2272 Melanocytic nevi of left lower limb, including hip: Secondary | ICD-10-CM | POA: Diagnosis not present

## 2021-01-31 DIAGNOSIS — D2261 Melanocytic nevi of right upper limb, including shoulder: Secondary | ICD-10-CM | POA: Diagnosis not present

## 2021-01-31 DIAGNOSIS — D2271 Melanocytic nevi of right lower limb, including hip: Secondary | ICD-10-CM | POA: Diagnosis not present

## 2021-01-31 DIAGNOSIS — D225 Melanocytic nevi of trunk: Secondary | ICD-10-CM | POA: Diagnosis not present

## 2021-02-05 ENCOUNTER — Other Ambulatory Visit: Payer: Self-pay

## 2021-02-05 ENCOUNTER — Other Ambulatory Visit: Payer: Self-pay | Admitting: Internal Medicine

## 2021-02-05 ENCOUNTER — Ambulatory Visit (HOSPITAL_BASED_OUTPATIENT_CLINIC_OR_DEPARTMENT_OTHER)
Admission: RE | Admit: 2021-02-05 | Discharge: 2021-02-05 | Disposition: A | Discharge status: Home / Self Care | Payer: Medicare Other | Source: Ambulatory Visit | Attending: Internal Medicine | Admitting: Internal Medicine

## 2021-02-05 DIAGNOSIS — J8489 Other specified interstitial pulmonary diseases: Secondary | ICD-10-CM | POA: Insufficient documentation

## 2021-02-05 DIAGNOSIS — R918 Other nonspecific abnormal finding of lung field: Secondary | ICD-10-CM | POA: Diagnosis not present

## 2021-02-05 DIAGNOSIS — I700 Atherosclerosis of aorta: Secondary | ICD-10-CM | POA: Diagnosis not present

## 2021-02-05 DIAGNOSIS — M359 Systemic involvement of connective tissue, unspecified: Secondary | ICD-10-CM | POA: Insufficient documentation

## 2021-02-05 DIAGNOSIS — A6009 Herpesviral infection of other urogenital tract: Secondary | ICD-10-CM

## 2021-02-12 ENCOUNTER — Ambulatory Visit (HOSPITAL_COMMUNITY): Payer: Medicare Other | Attending: Cardiovascular Disease

## 2021-02-12 ENCOUNTER — Other Ambulatory Visit: Payer: Self-pay

## 2021-02-12 DIAGNOSIS — R0609 Other forms of dyspnea: Secondary | ICD-10-CM | POA: Diagnosis not present

## 2021-02-12 DIAGNOSIS — I10 Essential (primary) hypertension: Secondary | ICD-10-CM | POA: Insufficient documentation

## 2021-02-12 LAB — ECHOCARDIOGRAM COMPLETE
Area-P 1/2: 3.41 cm2
S' Lateral: 2.9 cm

## 2021-02-19 ENCOUNTER — Ambulatory Visit (HOSPITAL_COMMUNITY): Payer: Medicare Other

## 2021-02-22 ENCOUNTER — Other Ambulatory Visit: Payer: Self-pay | Admitting: Internal Medicine

## 2021-02-22 DIAGNOSIS — A6009 Herpesviral infection of other urogenital tract: Secondary | ICD-10-CM

## 2021-02-27 ENCOUNTER — Other Ambulatory Visit: Payer: Self-pay

## 2021-02-27 ENCOUNTER — Other Ambulatory Visit: Payer: Self-pay | Admitting: Internal Medicine

## 2021-02-27 DIAGNOSIS — A6009 Herpesviral infection of other urogenital tract: Secondary | ICD-10-CM

## 2021-02-27 MED ORDER — LIVALO 2 MG PO TABS: ORAL_TABLET | ORAL | 3 refills | Status: DC

## 2021-03-01 ENCOUNTER — Other Ambulatory Visit: Payer: Self-pay | Admitting: Cardiovascular Disease

## 2021-03-05 ENCOUNTER — Encounter: Payer: Self-pay | Admitting: Internal Medicine

## 2021-03-05 ENCOUNTER — Ambulatory Visit (INDEPENDENT_AMBULATORY_CARE_PROVIDER_SITE_OTHER): Payer: Medicare Other | Admitting: Internal Medicine

## 2021-03-05 ENCOUNTER — Other Ambulatory Visit: Payer: Self-pay

## 2021-03-05 VITALS — BP 114/74 | HR 71 | Temp 99.1°F | Ht 66.25 in | Wt 183.2 lb

## 2021-03-05 DIAGNOSIS — J8489 Other specified interstitial pulmonary diseases: Secondary | ICD-10-CM | POA: Diagnosis not present

## 2021-03-05 DIAGNOSIS — M349 Systemic sclerosis, unspecified: Secondary | ICD-10-CM

## 2021-03-05 DIAGNOSIS — J849 Interstitial pulmonary disease, unspecified: Secondary | ICD-10-CM | POA: Diagnosis not present

## 2021-03-05 DIAGNOSIS — M359 Systemic involvement of connective tissue, unspecified: Secondary | ICD-10-CM

## 2021-03-05 NOTE — Progress Notes (Signed)
IOV 12/10/2015 - history some the patient and review of the outside records from Dr. Bronson Curb in cardiology office notes 11/19/2015 Dr. Haroldine Laws  Chief Complaint  Patient presents with   Pulmonary Consult    Referred by Dr Estanislado Pandy- Scleroderma - Had HRCT 11/22/15- SOB with walking up driveway or up steps - Denies cough or wheezing    76 year old female was diagnosed with scleroderma in 2008 when she was 76 years old. Notice many problems with the skin in her fingers and the form of a sclerodactyly. I According to her history and referral notes she did have a biopsy with Dr. Wilhemina Bonito that confirmed the scleroderma diagnosis     In 2008 she had dyspnea. She had a cardiac stress test at that time she had a Myoview for stress ejection fraction was 81% but resting echo showe d ejection fraction of 45%. She does not remember this but this is per cardiology notes. He tells me now that she's had insidious onset of dyspnea for the last few years. It has been steadily progressive. It is moderate in intensity. She notices this when she climbs stairs or goes to [employer]. It is relieved by rest. It is not associated with any chest pain. This no associated cough or edema or orthopnea paroxysmal nocturnal dyspnea presyncope. According to the cardiology notes she snores a lot.  She had echocardiogram this fall 2017 document below that shows grade 1 diastolic dysfunction with good left ventricular ejection fraction. Right ventricular pump function is normal. No comment on pulmonary systolic pressures.  She underwent pulmonary function test that shows isolated reduction in diffusion capacity documented below. She then underwent high-resolution CT chest that I personally visualized shows some faint groundglass opacities. Therefore she has been referred here.  She tells me that for her scleroderma she's been treated with methotrexate in The past. She Does Have the Details. She Says This Did Not Help  Her. Overall She Is a Nonsmoker. She Drinks Alcohol Socially. She Does Walking for Exercise but Does Have Right Knee Joint Discomfort. Rheumatology Notes Indicate Significant Issues with Osteoarthritis Especially of the Right Knee Is Documented in Office Visits in November 2016 and Subsequently.Also arthritis is also documented in her hand joints   Tests below  Pulmonary function test 11/09/2015 FVC 2.4 L/1%, FEV1 2.6 L/104%. DLCO 5.3/95% and DLCO 17.03/60%. She has isolated reduction in diffusion capacity  IMPRESSION: HRCT 11/22/15 1. Mild patchy subpleural ground-glass attenuation and reticulation in both lungs, most prominent in the lower lobes. No significant traction bronchiectasis. No frank honeycombing. 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 A follow-up high-resolution chest CT study in 12 months would be useful to assess temporal pattern stability. 2. Additional findings include aortic atherosclerosis, three-vessel coronary atherosclerosis and possible 0.9 cm lower left thyroid lobe nodule.     Electronically Signed   By: [name] M.D.   On: 11/22/2015 13:14  Walking desaturation test 185 feet 3 laps on room air today in the office 12/10/2015: Resting pulse ox was 94% after 3 laps pulse ox was 98%. She had some pain with her right knee where she had no dyspnea. Resting heart rate was 87/m Savannah [name] to 93/m  Echocardiogram 11/19/2015 shows grade 1 diastolic dysfunction but otherwise reported as normal. Including right ventricle    OV 10/09/2016  Chief Complaint  Patient presents with     Follow-up    Pt states she feels she is not able  to breathe in enough air. Pt denies significant cough. Pt states she has chest tightness at random times. Pt denies f/c/s.    Follow-up scleroderma with ILD associated with mild reduction in diffusion capacity - mild severity of disease on observation therapy  Last seen  October 2017. Since then she's been on observation therapy. She had pulmonary function testing in June 2018 that in the last 2 years is showing mild decline in diffusion capacity. She tells me that correlating with this she is feeling more shortness of breath with exertion and even at rest she feels that she has to take a deep breath. There is mild associated cough occasionally but there is no wheeze. There is no fever or edema. Otherwise feeling well. She has some questions about getting pneumonia vaccine. Apparently she's getting an alert about this. Review of the chart shows that she is up-to-date with both Pneumovax and Prevnar within 5 years  Walking desaturation test 185 feet 3 laps on room air: Walking desaturation test on 10/09/2016 185 feet x 3 laps on RA :  did NOT  desaturate. Rest pulse ox was 98%, final pulse ox was 98%. HR response 83/min at rest to 98/min at peak exertion.    OV 12/17/2016  Chief Complaint  Patient presents with   Follow-up    pft done today and HRCT done 10/23. 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 This continues unchanged. Within the staging workup high-resolution CT scan of the chest shows mild subtle interstitial lung disease that is unchanged compared to one year ago. However on pulmonary function tests this seems to be a trend was decline in both FVC and DLCO. If there is worsening interstitial lung disease this is not reflected on the CT scan [as yet] we also look for pulmonary hypertension with echocardiogram which she had October 2018 shows grade 1 diastolic dysfunctionbut no documentation of pulmonary hypertension. She has never been to pulmonary rehabilitation because of her knee issues she is a bit deconditioned. She is interested in pulmonary rehabilitation but she wants to get this  done in [place] where she lives between January and May 2019    IMPRESSION: compared to oct 2017 1. 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 2. Aortic atherosclerosis, in addition to left main and 3 vessel coronary artery disease. Please note that although the presence of coronary artery calcium documents the presence of coronary artery disease, the severity of this disease and any potential stenosis cannot be assessed on this non-gated CT examination. Assessment for potential risk factor modification, dietary therapy or pharmacologic therapy may be warranted, if clinically indicated.   Aortic Atherosclerosis (ICD10-I70.0).     Electronically Signed   By: Vinnie Langton M.D.   On: 12/09/2016 12:07   OV 12/16/2017  Subjective:  Patient ID: Savannah [name], female , DOB: [birth year] , age 32 y.o. , MRN: [id number] , ADDRESS: [address] [place] [address]   12/16/2017 -   Chief Complaint  Patient presents with   Follow-up    shortness of breath     HPI Savannah [name] 76 y.o. 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  Stable over time.  She returns for follow-up.  She spent the time from  January through September in [place].  She was supposed to return in July but her return got delayed.  Therefore she is here for this follow-up visit in October 2019.  And once again in January 2020 all the way through July 2020 she will be back in [place] in [place].  At this point in time she is continues to do well without any worsening shortness of breath.  She can do activities of daily living and climb hills and stairs without a problem.  Although she does feel that the edema in her fingers is slowly getting worse particularly in the right hand.  There is no chest pain or any other side effects.  She had pulmonary function test today  that shows continued stability over 3-1/2 years.      OV 01/17/2019  Subjective:  Patient ID: Savannah [name], female , DOB: 1945-09-01 , age 50 y.o. , MRN: [id number] , ADDRESS: [address] [place] [id number]   01/17/2019 -   Chief Complaint  Patient presents with   Follow-up    Pt states she has been doing good since last visit. Pt denies any complaints of any cough and states SOB is about the same since last visit.   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  HPI Savannah [name] 76 y.o. -she returns for 1 year follow-up.  She says overall she is stable in terms of her shortness of breath.  She had a high-resolution CT chest which is a 2-year follow-up.  Reported as stable.  Her symptom scores are listed below.  She did see cardiology Dr. Haroldine Laws who has been reassured with her echocardiogram.  She does have coronary artery calcification he is aware of this.  She did have pulmonary function test today and her DLCO seems fluctuant but stable and a slight decline in her FVC.  She is surprised by this because overall she feels stable.  She is not had a flu shot and she defers a flu shot every year.  She is asking about the Covid vaccine.  She is social distancing and does not spend her time with any human clusyer r of more than 2 people over the holiday weekend.   OV 01/25/2020   Subjective:  Patient ID: Savannah [name], female , DOB: 1945/08/04, age 54 y.o. years. , MRN: [id number],  ADDRESS: [address] [place] [address] PCP  Janith Lima, MD Providers : Treatment Team:  Attending Provider: Brand Males, MD Patient Care Team: Janith Lima, MD as PCP - General (Internal Medicine)    Chief Complaint  Patient presents with   Follow-up    PFT performed 11/30.  Pt states she has been doing good since last visit and denies any complaints.     Follow-up mild interstitial lung disease in the setting of  scleroderma: On observation therapy  HPI Savannah [name] 76 y.o. -last seen in November 2020.  After that she was supposed to come in the summer 2021.  However she lives part-time in [place] husband undergoes physical therapy.  They have a condominium there.  Therefore she has not been able to attend.  From a symptom standpoint she says she is stable.  She wants to the point rehabilitation in [place].  She wants a referral.  She has a rheumatology appointment tomorrow.  She saw Dr. Haroldine Laws cardiology yesterday.  Echocardiogram reviewed did not show any pulmonary hypertension.  2-year follow-up has been given  for her.  She is very active.  Current symptom scores are listed below.  She did pulmonary function test.  This is documented below in my view it is showing steady slow decline in both diffusion and capacity.  However symptomatically she does not feel this.  But looking at the ILD symptom score below symptoms seem progressive based on numbers.   Results for CATERIN, TABARES (MRN [id number]) as of 12/17/2016 11:16  Ref. Range 08/08/2014 09:58 11/09/2015 10:48 08/04/2016 10:57 12/17/2016 09:43 12/16/2017  12/29/2018 01/25/2020   FVC-Pre Latest Units: L 3.36 3.39 3.24 3.21 3.26 3.15 3.02  FVC-%Pred-Pre Latest Units: % 99 101 98 97 98 100% 97%   Results for TATIANA, COURTER (MRN [id number]) as of 12/17/2016 11:16  Ref. Range 08/08/2014 09:58 11/09/2015 10:48 08/04/2016 10:57 12/17/2016 09:43 12/16/2017  01/17/2019 GLI equation 01/25/2020   DLCO unc Latest Units: ml/min/mmHg 18.83 17.03 16.89 16.59 18.03 16.54 15.52  DLCO unc % pred Latest Units: % 66 60 59 58 63 79% 75%     HRCT compared to Oct 2018 -> OCt 2020 IMPRESSION: 1. Moderate patchy air trapping, indicative of small airways disease, unchanged. 2. Minimal patchy subpleural reticulation and ground-glass attenuation in the lungs, slightly less prominent in the interval. Stable scattered mild cylindrical bronchiolectasis in the mid  to lower lungs. No progressive findings. Findings could represent a very mild nonspecific interstitial pneumonia (NSIP). Findings are not compatible with usual interstitial pneumonia (UIP). Findings are suggestive of an alternative diagnosis (not UIP) per consensus guidelines: Diagnosis of Idiopathic Pulmonary Fibrosis: An Official ATS/ERS/JRS/ALAT Clinical Practice Guideline. [place], Iss 5, [phone], Oct 18 2016. 3. Three-vessel coronary atherosclerosis.   Aortic Atherosclerosis (ICD10-I70.0).     Electronically Signed   By: [name] M.D.   On: 12/07/2018 15:43  ROS - per HPI   OV 01/23/2021  Subjective:  Patient ID: Savannah [name], female , DOB: 1945/09/10 , age 66 y.o. , MRN: [id number] , ADDRESS: [address] [place] [address] PCP Janith Lima, MD Patient Care Team: Janith Lima, MD as PCP - General (Internal Medicine) Troy Sine, MD as PCP - Cardiology (Cardiology)  This Provider for this visit: Treatment Team:  Attending Provider: Brand Males, MD    01/23/2021 -   Chief Complaint  Patient presents with   Follow-up    F/U after PFT. States she has been doing well since last visit.      Follow-up mild interstitial lung disease in the setting of scleroderma: On observation therapy  HPI SHAMARIAH SHEWMAKE 76 y.o. -returns for 1 year follow-up.  She spends summers in [place] and winters here.  Over the middle of 2022 she developed right breast cancer and had right-sided lumpectomy in [facility] at the [place].  From a dyspnea standpoint she continues to be stable.  She had pulmonary function testing.  Her FVC is normal but nevertheless shows a decline.  Her DLCO is just below 80% and still shows overall trend towards decline.  But she feels the same.  Her last echocardiogram was a year ago.  Dr. Haroldine Laws will see her on every 2 years.  Her last CT scan of the chest was 1 year ago.  We  discussed the fact scleroderma ILD can be progressive.  Discussed the fact that if there is at least 10% ILD on the CT than his indication for antifibrotic.  Also discussed the fact progressive phenotype is indication  for antifibrotic.  She is open to this idea.  She is agreeable to get CT scan of the chest      OV 03/05/2021  Subjective:  Patient ID: Savannah [name], female , DOB: 06/08/1945 , age 26 y.o. , MRN: [id number] , ADDRESS: [address] [place] [address] PCP Janith Lima, MD Patient Care Team: Janith Lima, MD as PCP - General (Internal Medicine) Troy Sine, MD as PCP - Cardiology (Cardiology)  This Provider for this visit: Treatment Team:  Attending Provider: Brand Males, MD  Follow-up mild interstitial lung disease in the setting of scleroderma: On observation therapy  03/05/2021 -   Chief Complaint  Patient presents with   Follow-up    Pt is following up after recent CT and echo.  Pt states she has been doing okay since last visit.   Scleroderma with mild ILD.  Slow progression over time.  HPI Savannah [name] 76 y.o. -here for follow-up to discuss the test results.  She continues to do well.  She had echocardiogram that just shows grade 1 diastolic dysfunction.  No evidence of pulmonary hypertension.  She had high-resolution CT chest December 2022.  Compared to December 2021 no progression.  She also had pulmonary function test that shows a 2.5% decline in FVC [not statistically significant] in 2 years but stability in the last 1 year.  As she feels stable.  We discussed different options.  Versus expectant follow-up but did indicate to her that the field of interstitial lung disease particularly non-- IPF is changing and given slow progression over time we do need to keep a closer eye because antifibrotic's would be indicated if there is more significant progression.  She understands this.  She lives half the year in [place] in [place].  She will be  back in the summer.  She did express to her about the concept of clinical trials as a care option particularly with red disease scleroderma.  She is interested if she is in [place] and not traveling.    SYMPTOM SCALE - ILD 01/17/2019  01/25/2020  01/23/2021  03/05/2021   O2 use ra ra ra   Shortness of Breath 0 -> 5 scale with 5 being worst (score 6 If unable to do)     At rest 0 1 0   Simple tasks - showers, clothes change, eating, shaving 0 1 1   Household (dishes, doing bed, laundry) 2 3 1    Shopping 2 2 1    Walking level at own pace 2 2 2    Walking up Stairs 3 3 2    Total (40 - 48) Dyspnea Score 9 12 7    How bad is your cough? 0  1 0   How bad is your fatigue 2  2 1    Nausea   1 0   Vomiting   0 0   Diarrhea   0 00   Anxiety   1 0   Depression   2 0       Simple office walk 185 feet x  3 laps goal with forehead probe 12/16/2017  01/17/2019  01/23/2021    O2 used Room air Room air ra  Number laps completed 3 3 3   Comments about pace normal avg pace   Resting Pulse Ox/HR 98% and 70/min 100% and 82/min 98% and HR 74  Final Pulse Ox/HR 99% and 90/min 98% and 99/min 95% and HR 92  Desaturated </= 88% no no no  Desaturated <=  3% points no no Yes 3 poitns  Got Tachycardic >/= 90/min yes yes yes  Symptoms at end of test x nomne Mild chest tightness  Miscellaneous comments x x     CT Chest data - HRCT Dec 2022  Narrative & Impression  CLINICAL DATA:  Interstitial lung disease   EXAM: CT CHEST WITHOUT CONTRAST   TECHNIQUE: Multidetector CT imaging of the chest was performed following the standard protocol without intravenous contrast. High resolution imaging of the lungs, as well as inspiratory and expiratory imaging, was performed.   COMPARISON:  CT chest dated February 09, 2020   FINDINGS: Cardiovascular: Normal heart size. No pericardial effusion. Three-vessel coronary artery calcifications. Atherosclerotic disease of the thoracic aorta.    Mediastinum/Nodes: No pathologically enlarged lymph nodes seen in the chest. Esophagus is unremarkable.   Lungs/Pleura: Central airways are patent. Mild bilateral air trapping. Mild focal bronchiectasis the right lower lobe, likely sequela of prior infection. Minimal reticular opacities are again seen at the lung bases and unchanged compared to prior exam. No traction bronchiectasis or honeycomb change. Stable solid pulmonary nodule of the right middle lobe measuring 3 mm on series 6, image 87, favored to be benign given 1 year stability.   Upper Abdomen: No acute abnormality.   Musculoskeletal: No chest wall mass or suspicious bone lesions identified.   IMPRESSION: 1. Minimal basilar reticular opacities with no evidence of progression. Findings are indeterminate for UIP per consensus guidelines: Diagnosis of Idiopathic Pulmonary Fibrosis: An Official ATS/ERS/JRS/ALAT Clinical Practice Guideline. [place], Iss 5, [phone], Oct 18 2016. 2. Bilateral air trapping, findings can be seen in the setting of small airways disease. 3. Coronary artery calcifications. 4. Aortic Atherosclerosis (ICD10-I70.0).     Electronically Signed   By: Yetta Glassman M.D.   On: 02/06/2021 09:22       IMPRESSIONS  ECHO dec 20122    1. Left ventricular ejection fraction, by estimation, is 60 to 65%. The  left ventricle has normal function. The left ventricle has no regional  wall motion abnormalities. Left ventricular diastolic parameters are  consistent with Grade I diastolic  dysfunction (impaired relaxation).   2. Right ventricular systolic function is normal. The right ventricular  size is normal. There is normal pulmonary artery systolic pressure.   3. The mitral valve is normal in structure. Trivial mitral valve  regurgitation. No evidence of mitral stenosis.   4. The aortic valve is tricuspid. Aortic valve regurgitation is not  visualized. No aortic stenosis  is present.   5. The inferior vena cava is normal in size with greater than 50%  respiratory variability, suggesting right atrial pressure of 3 mmHg.   FINDINGS   No results found.  PFT  PFT Results Latest Ref Rng & Units 01/23/2021 01/17/2020 12/29/2018 12/16/2017 12/17/2016 08/04/2016 11/09/2015  FVC-Pre L 3.07 3.02 3.15 3.21 3.21 3.24 3.39  FVC-Predicted Pre % 100 97 100 98 97 98 101  FVC-Post L - - 3.15 - - 3.03 3.30  FVC-Predicted Post % - - 100 - - 91 99  Pre FEV1/FVC % % 76 75 76 77 79 79 78  Post FEV1/FCV % % - - 80 - - 83 81  FEV1-Pre L 2.34 2.27 2.38 2.48 2.53 2.55 2.65  FEV1-Predicted Pre % 101 97 100 100 101 101 104  FEV1-Post L - - 2.52 - - 2.50 2.67  DLCO uncorrected ml/min/mmHg 15.68 15.75 16.54 18.01 16.59 16.89 17.03  DLCO UNC% % 76 76  79 63 58 59 60  DLCO corrected ml/min/mmHg 15.68 15.52 - - 17.02 16.84 -  DLCO COR %Predicted % 76 75 - - 60 59 -  DLVA Predicted % 79 80 82 72 66 66 66  TLC L - - 5.05 - - 5.11 5.28  TLC % Predicted % - - 93 - - 92 95  RV % Predicted % - - 79 - - 76 80       has a past medical history of Dyspnea, Hyperlipidemia, Hypertension, Osteoarthritis, Swelling of extremity, and TIA (transient ischemic attack).   reports that she has never smoked. She has never used smokeless tobacco.  Past Surgical History:  Procedure Laterality Date   TONSILLECTOMY      Allergies  Allergen Reactions   Codeine Nausea Only    Other reaction(s): Unknown   Cymbalta [Duloxetine Hcl] Hives   Lyrica [Pregabalin] Hives   Penicillins    Sulfa Antibiotics Hives   Zetia [Ezetimibe] Other (See Comments)    Myalgia   Zocor [Simvastatin] Other (See Comments)    Myalgia    Immunization History  Administered Date(s) Administered   Fluad Quad(high Dose 65+) 01/24/2019   Influenza, High Dose Seasonal PF 10/17/2019, 12/18/2020   PFIZER Comirnaty(Gray Top)Covid-19 Tri-Sucrose Vaccine 03/13/2019, 04/05/2019   PFIZER(Purple Top)SARS-COV-2 Vaccination  03/13/2019, 04/05/2019, 01/24/2020   Pneumococcal Conjugate-13 11/20/2014   Pneumococcal Polysaccharide-23 11/17/2013, 01/16/2021   Tdap 07/29/2016   Zoster, Live 12/09/2009    Family History  Problem Relation Age of Onset   Hyperlipidemia Father    Cirrhosis Father 72   Kidney failure Father 22   Stroke Maternal Grandmother 85   Hypertension Maternal Grandmother 40   Angina Maternal Grandfather 17   Heart attack Maternal Grandfather 88   Lung disease Paternal Grandmother    Atrial fibrillation Mother 65   Hypertension Mother      Current Outpatient Medications:    anastrozole (ARIMIDEX) 1 MG tablet, Take 1 mg by mouth daily., Disp: , Rfl:    aspirin 81 MG tablet, Take 81 mg by mouth., Disp: , Rfl:    b complex vitamins capsule, Take 1 capsule by mouth 2 (two) times a week., Disp: , Rfl:    Calcium Carbonate (CALCIUM 600 PO), Take by mouth 3 (three) times a week., Disp: , Rfl:    Cholecalciferol (D3 MAXIMUM STRENGTH PO), Take 5,000 Units by mouth 2 (two) times a week. , Disp: , Rfl:    Coenzyme Q10 200 MG capsule, Take 200 mg by mouth daily., Disp: , Rfl:    ezetimibe (ZETIA) 10 MG tablet, Take 10 mg by mouth 2 (two) times a week., Disp: , Rfl:    furosemide (LASIX) 20 MG tablet, TAKE 20 MG( 1/2 TABLET ) TO 40 MG ( 1 TABLET) AS NEEDED FOR FLUID OR EDEMA, Disp: 180 tablet, Rfl: 1   icosapent Ethyl (VASCEPA) 1 g capsule, Take 1 capsule (1 g total) by mouth 2 (two) times a week., Disp: 36 capsule, Rfl: 2   loratadine (CLARITIN) 10 MG tablet, Take 10 mg by mouth daily as needed for allergies., Disp: , Rfl:    losartan (COZAAR) 100 MG tablet, Take 1 tablet (100 mg total) by mouth daily. Patients needs a appointment for future refills., Disp: 90 tablet, Rfl: 0   Magnesium 300 MG CAPS, Take 300 mg by mouth 3 (three) times a week., Disp: , Rfl:    Menaquinone-7 (VITAMIN K2 PO), Take 50 mcg by mouth 2 (two) times a week. , Disp: ,  Rfl:    METOPROLOL TARTRATE PO, Take 25 mg by mouth daily.,  Disp: , Rfl:    Pitavastatin Calcium (LIVALO) 2 MG TABS, TAKE 1 TABLET BY MOUTH TWICE A WEEK, Disp: 90 tablet, Rfl: 3   potassium chloride SA (KLOR-CON M20) 20 MEQ tablet, Take 1 tablet (20 mEq total) by mouth daily as needed. When you take Furosemide (Lasix)., Disp: 90 tablet, Rfl: 3   valACYclovir (VALTREX) 1000 MG tablet, TAKE 1 TABLET BY MOUTH TWICE A DAY, Disp: 180 tablet, Rfl: 1   vitamin E 180 MG (400 UNITS) capsule, Take 400 Units by mouth daily., Disp: , Rfl:       Objective:   Vitals:   03/05/21 1517  BP: 114/74  Pulse: 71  Temp: 99.1 F (37.3 C)  TempSrc: Oral  SpO2: 97%  Weight: 183 lb 3.2 oz (83.1 kg)  Height: 5' 6.25" (1.683 m)    Estimated body mass index is 29.35 kg/m as calculated from the following:   Height as of this encounter: 5' 6.25" (1.683 m).   Weight as of this encounter: 183 lb 3.2 oz (83.1 kg).  @WEIGHTCHANGE @  [employer]   03/05/21 1517  Weight: 183 lb 3.2 oz (83.1 kg)     Physical Exam    General: No distress. Looks well Neuro: Alert and Oriented x 3. GCS 15. Speech normal Psych: Pleasant        Assessment:       ICD-10-CM   1. ILD (interstitial lung disease) ([place])  J84.9 Pulmonary function test         Plan:     Patient Instructions     ICD-10-CM   1. 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 Scleroderma ([facility])  M34.9    Symptoms also stble over 2 years PFt sable x 1-2 year CT scan stable x 1 year But lungs overall slightly progressive over 3+ years Echo - just mild heart muscle stiffness that is consitent with age and medical issues Appreiciate your interest in future research protocols if you are not traveling   Plan  - spirometry and dlco in 6-9 months - continue expectant approach  Followup  -6-9 months - 30 min visit but after PFT   - symptom score and walk test at followup  ( Level 03: Esbt 20-29 min in visit spent in visit type: on-site physical face to  visit in total care time and counseling or/and coordination of care by this undersigned MD - Dr Brand Males. This includes one or more of the following all delivered on this same day 03/05/2021: pre-charting, chart review, note writing, documentation discussion of test results, diagnostic or treatment recommendations, prognosis, risks and benefits of management options, instructions, education, compliance or risk-factor reduction. It excludes time spent by the [place] or office staff in the care of the patient. Actual time was 22 mn)   SIGNATURE    Dr. Brand Males, M.D., F.C.C.P,  Pulmonary and Critical Care Medicine Staff Physician, [facility] Director - Interstitial Lung Disease  Program  [facility] [place] [facility] [place], [place], [address]  Pager: [phone], If no answer or between  15:00h - 7:00h: call [phone] Telephone: [phone]  5:16 PM 03/05/2021

## 2021-03-05 NOTE — Patient Instructions (Addendum)
ICD-10-CM   1. 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

## 2021-03-06 DIAGNOSIS — Z08 Encounter for follow-up examination after completed treatment for malignant neoplasm: Secondary | ICD-10-CM | POA: Diagnosis not present

## 2021-03-06 DIAGNOSIS — R928 Other abnormal and inconclusive findings on diagnostic imaging of breast: Secondary | ICD-10-CM | POA: Diagnosis not present

## 2021-03-06 DIAGNOSIS — Z853 Personal history of malignant neoplasm of breast: Secondary | ICD-10-CM | POA: Diagnosis not present

## 2021-03-07 DIAGNOSIS — H2513 Age-related nuclear cataract, bilateral: Secondary | ICD-10-CM | POA: Diagnosis not present

## 2021-03-07 DIAGNOSIS — H35373 Puckering of macula, bilateral: Secondary | ICD-10-CM | POA: Diagnosis not present

## 2021-03-07 DIAGNOSIS — H43813 Vitreous degeneration, bilateral: Secondary | ICD-10-CM | POA: Diagnosis not present

## 2021-03-07 DIAGNOSIS — H16143 Punctate keratitis, bilateral: Secondary | ICD-10-CM | POA: Diagnosis not present

## 2021-03-22 IMAGING — CT CT CHEST HIGH RESOLUTION W/O CM
2 of 5 series · 15 of 36 positions shown, 18 images · non-contrast
Comparison: 12/09/2016 high-resolution chest CT.

CLINICAL DATA: Follow-up interstitial lung disease.

EXAM:
CT CHEST WITHOUT CONTRAST
TECHNIQUE: Multidetector CT imaging of the chest was performed following the
standard protocol without intravenous contrast. High resolution
imaging of the lungs, as well as inspiratory and expiratory imaging,
was performed.

[Series 2: high resolution · axial · 0.66mm/px · z∈[-368,-72]mm · 12 of 164 slices shown, 15 images]
[im 8/164  mediastinal]
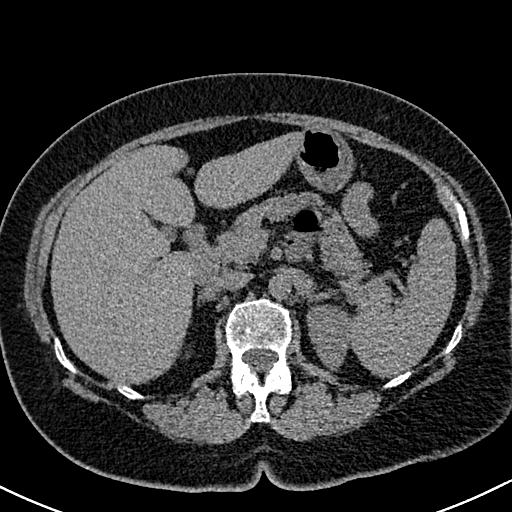
[im 8/164  lung]
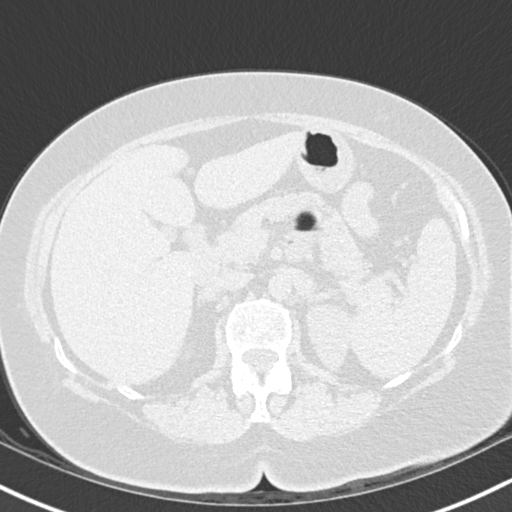
[im 23/164  lung]
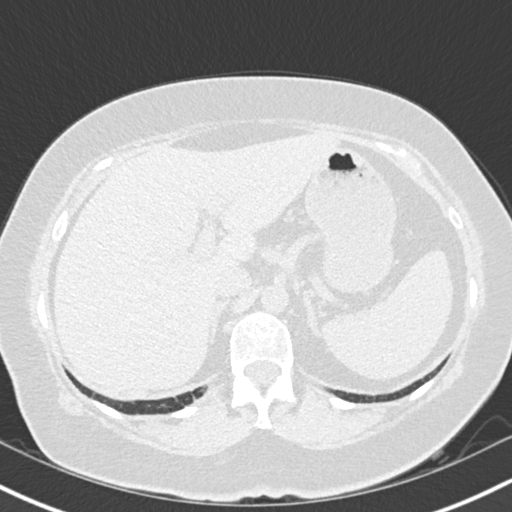
[im 38/164  lung]
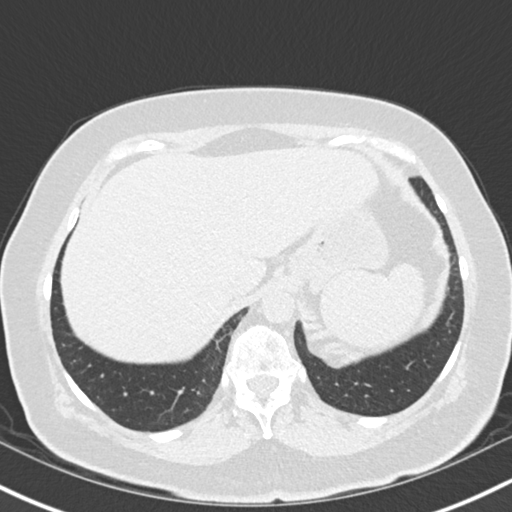
[im 52/164  lung]
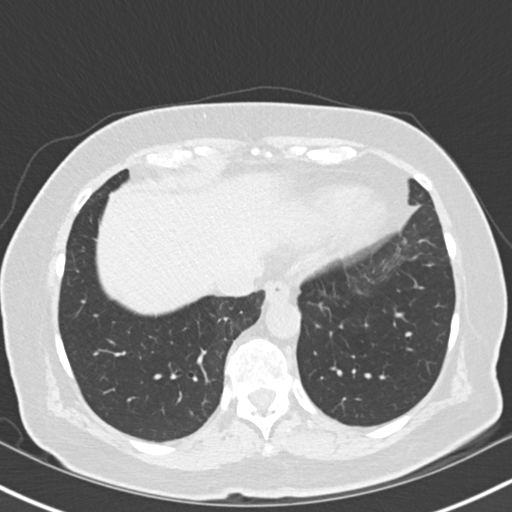
[im 60/164  mediastinal]
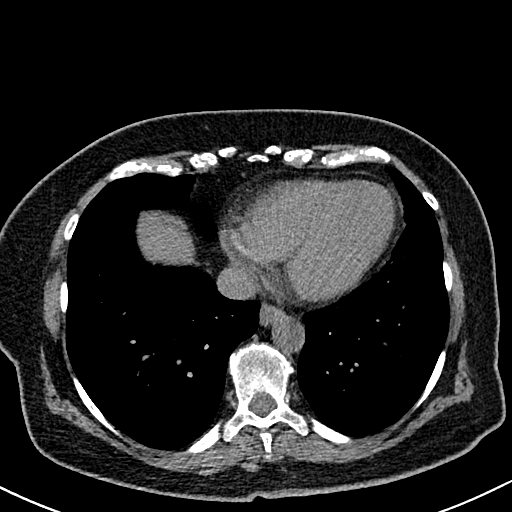
[im 60/164  lung]
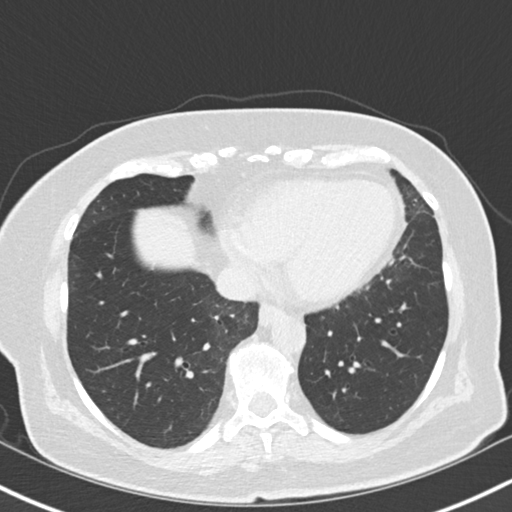
[im 75/164  lung]
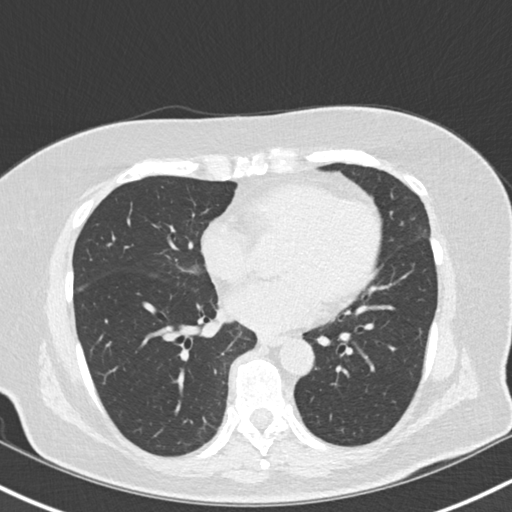
[im 89/164  lung]
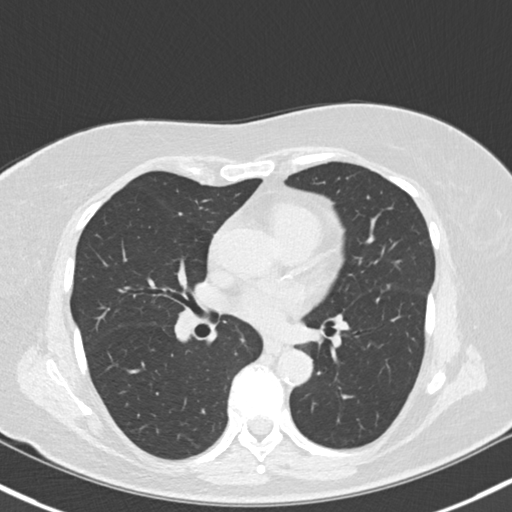
[im 104/164  lung]
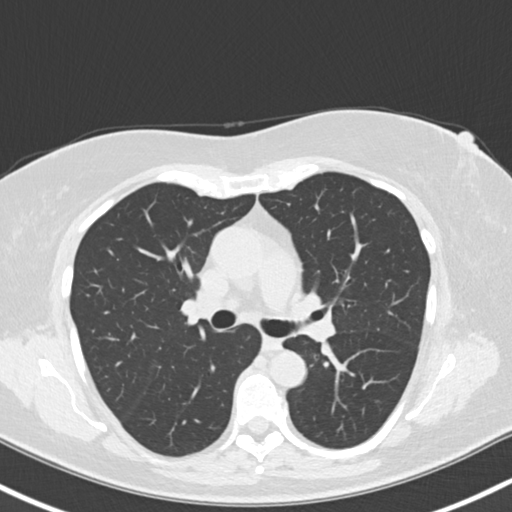
[im 112/164  mediastinal]
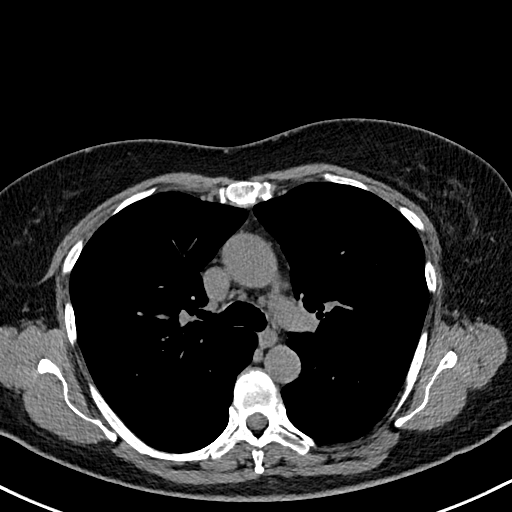
[im 112/164  lung]
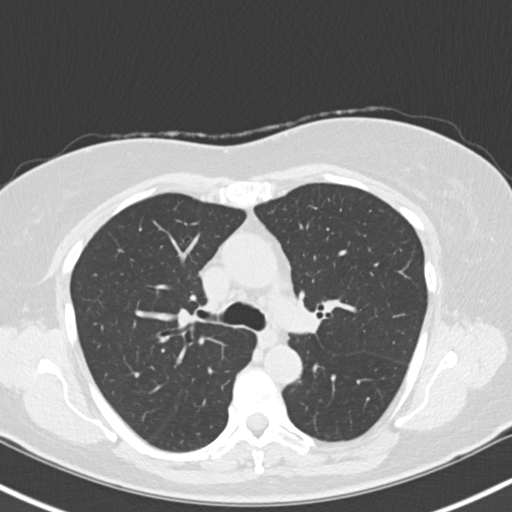
[im 126/164  lung]
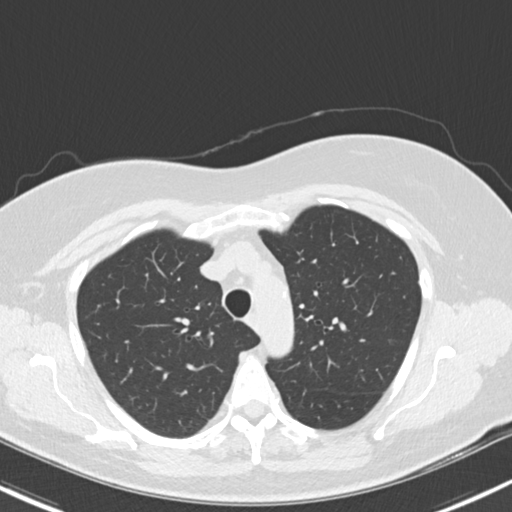
[im 141/164  lung]
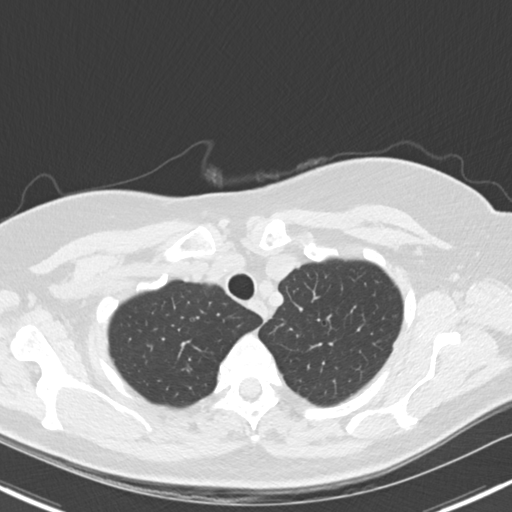
[im 156/164  lung]
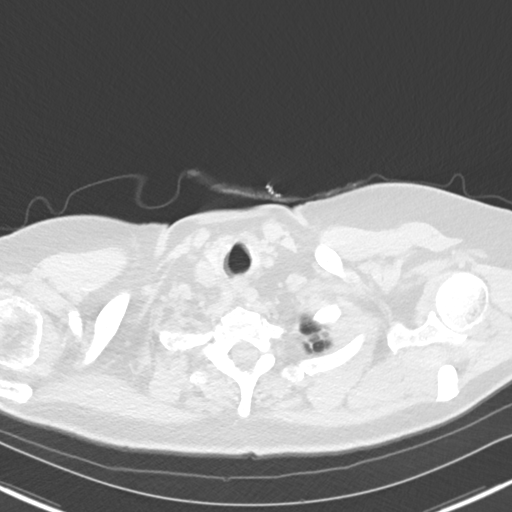

[Series 6: coronal · coronal · 0.64mm/px · 3 of 116 slices shown]
[im 24/116  lung]
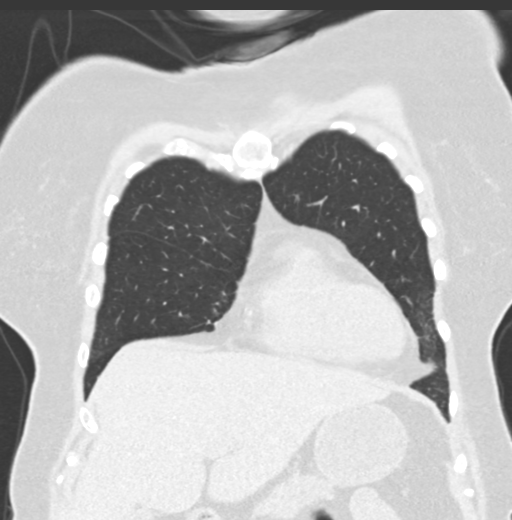
[im 47/116  lung]
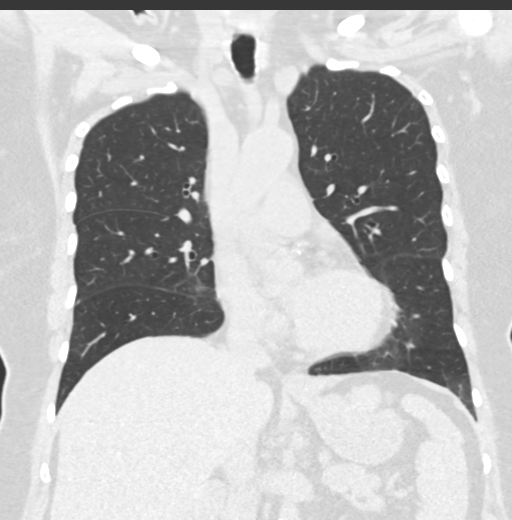
[im 70/116  lung]
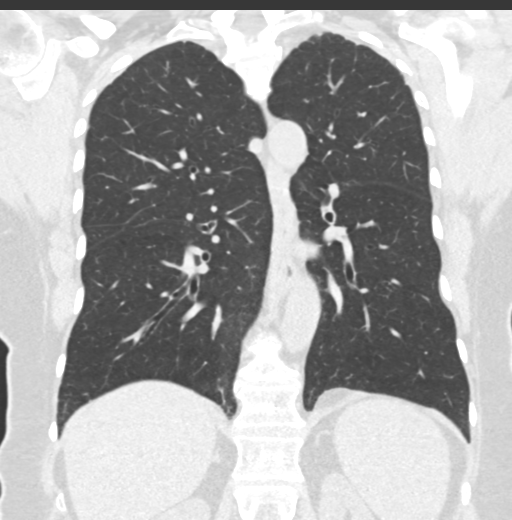

[15 of 36 positions shown; findings below may reference images not displayed]

FINDINGS: Cardiovascular: Normal heart size. No significant pericardial
effusion/thickening. Three-vessel coronary atherosclerosis.
Atherosclerotic nonaneurysmal thoracic aorta. Normal caliber
pulmonary arteries.

Mediastinum/Nodes: Probable hypodense 1.2 cm left thyroid nodule,
stable. Unremarkable esophagus. No pathologically enlarged axillary,
mediastinal or hilar lymph nodes, noting limited sensitivity for the
detection of hilar adenopathy on this noncontrast study.

Lungs/Pleura: No pneumothorax. No pleural effusion. No acute
consolidative airspace disease, lung masses or significant pulmonary
nodules. Moderate patchy air trapping in both lungs on the
expiration sequence, unchanged. No evidence of
tracheobronchomalacia. Minimal patchy subpleural reticulation and
ground-glass attenuation in both lungs, slightly less prominent
compared to 12/09/2016 chest CT. Scattered mild cylindrical
bronchiolectasis in the inferior segment lingula and medial basilar
right lower lobe, unchanged. No frank honeycombing.

Upper abdomen: No acute abnormality.

Musculoskeletal: No aggressive appearing focal osseous lesions.
Moderate thoracic spondylosis.
IMPRESSION: 1. Moderate patchy air trapping, indicative of small airways
disease, unchanged.
2. Minimal patchy subpleural reticulation and ground-glass
attenuation in the lungs, slightly less prominent in the interval.
Stable scattered mild cylindrical bronchiolectasis in the mid to
lower lungs. No progressive findings. Findings could represent a
very mild nonspecific interstitial pneumonia (NSIP). Findings are
not compatible with usual interstitial pneumonia (UIP). Findings are
suggestive of an alternative diagnosis (not UIP) per consensus
guidelines: Diagnosis of Idiopathic Pulmonary Fibrosis: An Official
ATS/ERS/JRS/ALAT Clinical Practice Guideline. Am J Respir Crit Care
Med Vol 198, Raymundo 5, [id number][DATE].
3. Three-vessel coronary atherosclerosis.

Aortic Atherosclerosis ([id number]).

## 2021-04-25 ENCOUNTER — Other Ambulatory Visit: Payer: Self-pay | Admitting: Cardiovascular Disease

## 2021-05-10 DIAGNOSIS — M9901 Segmental and somatic dysfunction of cervical region: Secondary | ICD-10-CM | POA: Diagnosis not present

## 2021-05-10 DIAGNOSIS — M542 Cervicalgia: Secondary | ICD-10-CM | POA: Diagnosis not present

## 2021-05-10 DIAGNOSIS — M9905 Segmental and somatic dysfunction of pelvic region: Secondary | ICD-10-CM | POA: Diagnosis not present

## 2021-05-10 DIAGNOSIS — M9903 Segmental and somatic dysfunction of lumbar region: Secondary | ICD-10-CM | POA: Diagnosis not present

## 2021-05-10 DIAGNOSIS — M9902 Segmental and somatic dysfunction of thoracic region: Secondary | ICD-10-CM | POA: Diagnosis not present

## 2021-05-10 DIAGNOSIS — M9904 Segmental and somatic dysfunction of sacral region: Secondary | ICD-10-CM | POA: Diagnosis not present

## 2021-05-10 DIAGNOSIS — M62830 Muscle spasm of back: Secondary | ICD-10-CM | POA: Diagnosis not present

## 2021-05-10 DIAGNOSIS — M545 Low back pain, unspecified: Secondary | ICD-10-CM | POA: Diagnosis not present

## 2021-06-07 DIAGNOSIS — Z20822 Contact with and (suspected) exposure to covid-19: Secondary | ICD-10-CM | POA: Diagnosis not present

## 2021-06-07 DIAGNOSIS — J029 Acute pharyngitis, unspecified: Secondary | ICD-10-CM | POA: Diagnosis not present

## 2021-06-07 DIAGNOSIS — B349 Viral infection, unspecified: Secondary | ICD-10-CM | POA: Diagnosis not present

## 2021-07-04 DIAGNOSIS — C50911 Malignant neoplasm of unspecified site of right female breast: Secondary | ICD-10-CM | POA: Diagnosis not present

## 2021-07-17 DIAGNOSIS — D225 Melanocytic nevi of trunk: Secondary | ICD-10-CM | POA: Diagnosis not present

## 2021-07-17 DIAGNOSIS — D1801 Hemangioma of skin and subcutaneous tissue: Secondary | ICD-10-CM | POA: Diagnosis not present

## 2021-07-17 DIAGNOSIS — Z85828 Personal history of other malignant neoplasm of skin: Secondary | ICD-10-CM | POA: Diagnosis not present

## 2021-07-17 DIAGNOSIS — L821 Other seborrheic keratosis: Secondary | ICD-10-CM | POA: Diagnosis not present

## 2021-09-19 ENCOUNTER — Other Ambulatory Visit: Payer: Self-pay | Admitting: General Practice

## 2021-09-19 DIAGNOSIS — I2510 Atherosclerotic heart disease of native coronary artery without angina pectoris: Secondary | ICD-10-CM

## 2021-09-19 DIAGNOSIS — Z8673 Personal history of transient ischemic attack (TIA), and cerebral infarction without residual deficits: Secondary | ICD-10-CM

## 2021-09-19 DIAGNOSIS — E782 Mixed hyperlipidemia: Secondary | ICD-10-CM

## 2021-09-19 DIAGNOSIS — I10 Essential (primary) hypertension: Secondary | ICD-10-CM

## 2021-09-19 DIAGNOSIS — M349 Systemic sclerosis, unspecified: Secondary | ICD-10-CM

## 2021-10-09 ENCOUNTER — Telehealth: Payer: Self-pay | Admitting: Cardiovascular Disease

## 2021-10-09 NOTE — Telephone Encounter (Signed)
Patient was calling to schedule appt with Dr. Claiborne Billings. Told patient of his availability and patient is upset because of how far out he is and she did not get to see him last time. Says that BP has been up. Would like for nurse to give patient a call.

## 2021-10-09 NOTE — Telephone Encounter (Signed)
Spoke to patient . Patient wanted an appointment with Dr Claiborne Billings because of her blood pressure.  She states her blood pressure has been elevated for a few weeks and was concerned.    Today's blood pressure was 163/82  pulse 77.  Patient states she is taking Losartan 100 mg at bedtime Metoprolol tartrate 25 mg  in the morning.  RN asked patient to send the reading through mychart for review. Will show to Dr Claiborne Billings for review   Schedule patient's annual appointment for Nov 2023

## 2021-10-10 ENCOUNTER — Encounter: Payer: Self-pay | Admitting: Cardiovascular Disease

## 2021-10-14 MED ORDER — METOPROLOL TARTRATE 25 MG PO TABS: 25.0000 mg | ORAL_TABLET | Freq: Two times a day (BID) | ORAL | 5 refills | Status: DC

## 2021-10-14 NOTE — Telephone Encounter (Signed)
Left message for patient . See mychart message

## 2021-10-15 NOTE — Telephone Encounter (Signed)
Called spoke to patient . She states she has been taking Metoprolol succinate 25 mg daily - not  metoprolol tartrate .   RN informed patient will need to discuss with Dr Claiborne Billings  if change to  medications is need as previous  suggested .  Patient is aware wil contact once RN speaks to Dr Claiborne Billings

## 2021-10-17 MED ORDER — METOPROLOL SUCCINATE ER 25 MG PO TB24: 25.0000 mg | ORAL_TABLET | Freq: Two times a day (BID) | ORAL | 6 refills | Status: DC

## 2021-10-17 NOTE — Telephone Encounter (Signed)
RN discussed with Dr Claiborne Billings  concerning patient  taking Metoprolol succinate 25 mg  daily instead of metoprolol tartarte 25 mg  Per Dr Claiborne Billings, increase  metoprolol succinate to 25 mg twice a day  .  RN will call patient to inform.

## 2021-10-17 NOTE — Telephone Encounter (Signed)
Spoke to patient . She is aware to take metoprolol  succinate 25 mg twice a day  And continue with monitoring blood pressure and bring reading to the Oct appointment Patient verbalized understanding.

## 2021-11-27 ENCOUNTER — Encounter: Payer: Self-pay | Admitting: Cardiovascular Disease

## 2021-11-27 ENCOUNTER — Ambulatory Visit: Payer: Medicare Other | Attending: Cardiovascular Disease | Admitting: Cardiovascular Disease

## 2021-11-27 VITALS — BP 114/60 | HR 78 | Ht 68.0 in | Wt 176.4 lb

## 2021-11-27 DIAGNOSIS — E785 Hyperlipidemia, unspecified: Secondary | ICD-10-CM | POA: Diagnosis not present

## 2021-11-27 DIAGNOSIS — I5032 Chronic diastolic (congestive) heart failure: Secondary | ICD-10-CM

## 2021-11-27 DIAGNOSIS — I38 Endocarditis, valve unspecified: Secondary | ICD-10-CM | POA: Insufficient documentation

## 2021-11-27 DIAGNOSIS — I5189 Other ill-defined heart diseases: Secondary | ICD-10-CM

## 2021-11-27 DIAGNOSIS — R739 Hyperglycemia, unspecified: Secondary | ICD-10-CM

## 2021-11-27 DIAGNOSIS — I2510 Atherosclerotic heart disease of native coronary artery without angina pectoris: Secondary | ICD-10-CM | POA: Insufficient documentation

## 2021-11-27 DIAGNOSIS — I10 Essential (primary) hypertension: Secondary | ICD-10-CM | POA: Diagnosis not present

## 2021-11-27 LAB — LIPID PANEL
Chol/HDL Ratio: 2.9 ratio (ref 0.0–4.4)
Cholesterol, Total: 139 mg/dL (ref 100–199)
HDL: 48 mg/dL (ref >39)
LDL Chol Calc (NIH): 63 mg/dL (ref 0–99)
Triglycerides: 167 mg/dL — ABNORMAL HIGH (ref 0–149)
VLDL Cholesterol Cal: 28 mg/dL (ref 5–40)

## 2021-11-27 LAB — TSH: TSH: 2.98 u[IU]/mL (ref 0.450–4.500)

## 2021-11-27 LAB — COMPREHENSIVE METABOLIC PANEL
ALT: 29 IU/L (ref 0–32)
AST: 22 IU/L (ref 0–40)
Albumin/Globulin Ratio: 2.2 (ref 1.2–2.2)
Albumin: 4.4 g/dL (ref 3.8–4.8)
Alkaline Phosphatase: 70 IU/L (ref 44–121)
BUN/Creatinine Ratio: 14 (ref 12–28)
BUN: 13 mg/dL (ref 8–27)
Bilirubin Total: 1.3 mg/dL — ABNORMAL HIGH (ref 0.0–1.2)
CO2: 26 mmol/L (ref 20–29)
Calcium: 10.3 mg/dL (ref 8.7–10.3)
Chloride: 103 mmol/L (ref 96–106)
Creatinine, Ser: 0.9 mg/dL (ref 0.57–1.00)
Globulin, Total: 2 g/dL (ref 1.5–4.5)
Glucose: 104 mg/dL — ABNORMAL HIGH (ref 70–99)
Potassium: 4.8 mmol/L (ref 3.5–5.2)
Sodium: 140 mmol/L (ref 134–144)
Total Protein: 6.4 g/dL (ref 6.0–8.5)
eGFR: 66 mL/min/{1.73_m2} (ref >59)

## 2021-11-27 LAB — CBC WITH DIFFERENTIAL/PLATELET
Basophils Absolute: 0.1 10*3/uL (ref 0.0–0.2)
Basos: 1 %
EOS (ABSOLUTE): 0.2 10*3/uL (ref 0.0–0.4)
Eos: 3 %
Hematocrit: 41.2 % (ref 34.0–46.6)
Hemoglobin: 14.4 g/dL (ref 11.1–15.9)
Immature Grans (Abs): 0 10*3/uL (ref 0.0–0.1)
Immature Granulocytes: 1 %
Lymphocytes Absolute: 1.7 10*3/uL (ref 0.7–3.1)
Lymphs: 29 %
MCH: 31.3 pg (ref 26.6–33.0)
MCHC: 35 g/dL (ref 31.5–35.7)
MCV: 90 fL (ref 79–97)
Monocytes Absolute: 0.4 10*3/uL (ref 0.1–0.9)
Monocytes: 7 %
Neutrophils Absolute: 3.6 10*3/uL (ref 1.4–7.0)
Neutrophils: 59 %
Platelets: 278 10*3/uL (ref 150–450)
RBC: 4.6 x10E6/uL (ref 3.77–5.28)
RDW: 12.3 % (ref 11.7–15.4)
WBC: 6.1 10*3/uL (ref 3.4–10.8)

## 2021-11-27 LAB — HEMOGLOBIN A1C
Est. average glucose Bld gHb Est-mCnc: 103 mg/dL
Hgb A1c MFr Bld: 5.2 % (ref 4.8–5.6)

## 2021-11-27 MED ORDER — METOPROLOL SUCCINATE ER 50 MG PO TB24: 50.0000 mg | ORAL_TABLET | Freq: Every day | ORAL | 3 refills | Status: DC

## 2021-11-27 NOTE — Patient Instructions (Signed)
Medication Instructions:  Change Metoprolol to Succinate 50 mg daily Continue all other medications *If you need a refill on your cardiac medications before your next appointment, please call your pharmacy*   Lab Work: Cmet,cbc,tsh,a1c,lipid panel today   Testing/Procedures: None ordered   Follow-Up: At [facility], you and your health needs are our priority.  As part of our continuing mission to provide you with exceptional heart care, we have created designated Provider Care Teams.  These Care Teams include your primary Cardiologist (physician) and Advanced Practice Providers (APPs -  Physician Assistants and Nurse Practitioners) who all work together to provide you with the care you need, when you need it.  We recommend signing up for the patient portal called "MyChart".  Sign up information is provided on this After Visit Summary.  MyChart is used to connect with patients for Virtual Visits (Telemedicine).  Patients are able to view lab/test results, encounter notes, upcoming appointments, etc.  Non-urgent messages can be sent to your provider as well.   To learn more about what you can do with MyChart, go to [email or web address].    Your next appointment:  6 months    Call in Jan to schedule April appointment     The format for your next appointment: Office   Provider:  [facility]   Important Information About [place]

## 2021-11-27 NOTE — Progress Notes (Signed)
Cardiology Office Note    Date:  11/27/2021   ID:  Savannah [name], DOB Jul 01, 1945, MRN [id number]  PCP:  Janith Lima, MD  Cardiologist:  Shelva Majestic, MD   No chief complaint on file.  23 month F/U cardiology evaluation   History of Present Illness:  Savannah [name] is a 76 y.o. female who presents for an 49 month f/u cardiology evaluation.  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  She has biopsy-proven scleroderma biopsy on her hand around 2005.  She has had issues with shortness of breath in the past.  In 2008, her ejection fraction was 45 to 55% by echocardiography and a Myoview study November 2008 was negative for ischemia with hyperdynamic post stress ejection fraction.  She has been seen by Dr. Haroldine Laws.  On CT imaging she was found to have mild interstitial lung disease for which she subsequently was referred to Dr. Chase Caller.  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  On that CT she was also noted to have aortic atherosclerosis in addition to left main and three-vessel coronary artery disease.  However this was not gated and assessment of obstructive stenoses could not be made.  On December 23, 2017 she underwent a follow-up echo Doppler study which showed an EF of 55 to 60%.  LV strain was normal at -17.4%.  She did not have regional wall motion abnormalities.  She had mild calcification of her aortic leaflets.  There is trivial MR and PR.  PA pressures were not increased.  She last saw Dr. Haroldine Laws in November 2019 and presents now for cardiology care and risk management.  She has a history of significant mixed hyperlipidemia and is on Vascepa and Livalo, apparently has only been taking Livalo maybe once or at most twice per week.   Admits to rare to occasional palpitations.  She denies recent chest tightness.  She denies presyncope or syncope.  I have cared for her husband.  She has a history of snoring but states her sleep is restorative.  She typically goes to [place] from mid January through June.  When I saw her in December 2019 I recommended that she undergo coronary CT angiography to further elucidate her coronary anatomy after she was found to have plaque on her chest CT.  This was performed on March 01, 2018.  Her calcium score was 20 which was only 35 percentile for age and sex.  There was mild plaque noted with less than 30% stenosis in the proximal and mid LAD, circumflex and right coronary arteries.  She had a normal aortic root at 3.3 cm.  I also recommended Vascepa in addition to Livalo for her mixed hyperlipidemia and added Zetia for an additional 20% lowering.  She went to [place] from January until October and returned last week.  Apparently she has only been taking Zetia 2 times per week and is only been taking Vascepa 2 times per week.  She has not had relaboratory.  She is no longer taking Livalo and apparently stopped taking it.  She denied chest pain PND orthopnea.  She will again begin to [place] in early 2021.    Since I last saw her, she had laboratory in October 2020.  Total cholesterol was 167, triglycerides 185,  HDL 41, LDL 94.  At that time, I had suggested the patient increase Zetia to 10 mg daily and also increase Vascepa to daily and she had only been taking these medicines 2 days/week.  She was evaluated by Dr. Haroldine Laws in November 2020.  An echo Doppler study December 30, 2018 showed EF 60 to 65% with grade 1 diastolic dysfunction.  Atrial size was normal.  She was felt to be well compensated guarding her scleroderma with related mild interstitial lung disease.  Her echo Doppler study there was no evidence for pulmonary hypertension or RV strain.  Her PFTs showed stable spirometry and improved DLCO.   A high resolution CT in October 2020 showed mild ILD slightly improved from previous assessment.  She was noted to have coronary calcification on CT imaging.  She was active without chest pain or exertional dyspnea and preferred avoiding further testing.  I saw her in January 2021 prior to her returning back to [place].  At that time she denied any chest pain, PND orthopnea.  She had  been taking losartan 100 mg daily.  She has a prescription for furosemide but she has not required use and had not taken this recently.  Apparently she was still only taking Zetia 10 mg 2 days/week and Vascepa 1 g by mouth once a week.  During that evaluation, I again reviewed her coronary CTA which revealed a calcium score of 20 and mild plaque in the LAD, circumflex and RCA.  At that time she was no longer taking Livalo but had tolerated that well in the past and I recommended resumption of Livalo 2 mg and to try initiating this at least 2 days/week.  I also recommended she try increasing Zetia if not to 10 mg daily then at least every other day.  I last saw her on January 09, 2020.  She had recently returned from [place] with her husband.  Her blood pressure had been somewhat labile with systolic pressure ranging from 1 20-1 60.  On one occasion it had reached 172 mmHg.  She denies chest pain or significant shortness of breath.  She is scheduled to undergo pulmonary function studies on November 30 as well as a follow-up echo Doppler study on December 6.  She is also followed by Dr. Haroldine Laws for her scleroderma study and has a follow-up office visit with Dr. Chase Caller regarding her interstitial lung disease.    She was evaluated by Coletta Memos in November 2022.  It was very active taking care of her husband who had severe Parkinson's disease.  She underwent a follow-up high-resolution chest CT on February 05, 2021 ordered by Dr. Chase Caller which showed minimal basilar reticular opacities with no evidence for progression.   The findings were indeterminate for UIP.  There was bilateral air trapping which may be seen in the setting of small airways disease.  She was noted to have coronary artery calcifications and aortic atherosclerosis.  On February 12, 2021 she underwent a follow-up echo Doppler which showed EF 60 to 65% and grade 1 diastolic dysfunction.  PA pressure was normal and valves essentially were normal.    Since I last saw her, unfortunately her husband Savannah [name] who also was one of my patients died on November 16, 4036 at 76 years old.  She will be going down to [place] with a friend to help her with the department down there but will be returning back to [place].  She denies any chest pain.  She notes rare indigestion.  She continues to have scleroderma and at times notes trace ankle swelling.  She has not had recent laboratory.  She states her blood pressure at home typically runs in the 130s to 150s.  She had recently been started on metoprolol succinate and was taking this 25 mg twice a day.  She presents for evaluation.  Past Medical History:  Diagnosis Date   Dyspnea    MYOVIEW, 12/29/2006 - post-stress EF 81%,no ECG changes, EKG negative for ischemia   Hyperlipidemia    Hypertension    2D ECHO, 12/24/2006 - EF 45-55%, normal   Osteoarthritis    Swelling of extremity    LEA VENOUS DUPLEX, 08/19/2011 - deep valvular insufficiency noted with right femoral and popliteal veins and left common femoral vein   TIA (transient ischemic attack)    CAROTID DOPPLER, 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    Past Surgical History:  Procedure Laterality Date   TONSILLECTOMY      Current Medications: Outpatient Medications Prior to Visit  Medication Sig Dispense Refill   aspirin 81 MG tablet Take 81 mg by mouth.     b complex vitamins capsule Take 1 capsule by mouth 2 (two) times a week.     Calcium Carbonate (CALCIUM 600 PO) Take by mouth 3 (three) times a week.      Cholecalciferol (D3 MAXIMUM STRENGTH PO) Take 5,000 Units by mouth 2 (two) times a week.      Coenzyme Q10 200 MG capsule Take 200 mg by mouth daily.     ezetimibe (ZETIA) 10 MG tablet Take 10 mg by mouth 2 (two) times a week.     furosemide (LASIX) 20 MG tablet TAKE 20 MG( 1/2 TABLET ) TO 40 MG ( 1 TABLET) AS NEEDED FOR FLUID OR EDEMA 180 tablet 1   loratadine (CLARITIN) 10 MG tablet Take 10 mg by mouth daily as needed for allergies.     losartan (COZAAR) 100 MG tablet Take 1 tablet (100 mg total) by mouth daily. 90 tablet 3   Magnesium 300 MG CAPS Take 300 mg by mouth 3 (three) times a week.     Menaquinone-7 (VITAMIN K2 PO) Take 50 mcg by mouth 2 (two) times a week.      Pitavastatin Calcium (LIVALO) 2 MG TABS TAKE 1 TABLET BY MOUTH TWICE A WEEK 90 tablet 3   potassium chloride SA (KLOR-CON M20) 20 MEQ tablet Take 1 tablet (20 mEq total) by mouth daily as needed. When you take Furosemide (Lasix). 90 tablet 3   valACYclovir (VALTREX) 1000 MG tablet TAKE 1 TABLET BY MOUTH TWICE A DAY 180 tablet 1   VASCEPA 1 g capsule TAKE 1 CAPSULE (1 G TOTAL) BY MOUTH 2 (TWO) TIMES A WEEK. 36 capsule 2   vitamin E 180 MG (400 UNITS) capsule Take 400 Units by mouth daily.     metoprolol succinate (TOPROL XL) 25 MG 24 hr tablet Take 1 tablet (25 mg total) by mouth 2 (two) times daily. (Patient not taking: Reported on 11/27/2021) 60 tablet 6   No facility-administered medications prior to visit.     Allergies:   Codeine, Cymbalta [duloxetine hcl], Lyrica [pregabalin], Penicillins, Sulfa antibiotics, Zetia [ezetimibe], and Zocor [simvastatin]   Social History   Socioeconomic History   Marital status: Married    Spouse name: Not on file   Number of children: Not on file   Years of education: Not on file   Highest education level: Not on file  Occupational History   Not on file  Tobacco Use   Smoking status: Never   Smokeless tobacco: Never  Vaping Use   Vaping Use: Never used  Substance and Sexual  Activity   Alcohol use: Yes    Alcohol/week: 6.0 standard drinks of alcohol    Types: 6 drink(s) per week   Drug use: No   Sexual activity: Not on file  Other Topics Concern   Not on file  Social History Narrative   Not on file   Social Determinants of Health   Financial Resource Strain: Not on file  Food Insecurity: Not on file  Transportation Needs: Not on file  Physical Activity: Not on file  Stress: Not on file  Social Connections: Not on file     Family History:  The patient's family history includes Angina (age of onset: 20) in her maternal grandfather; Atrial fibrillation (age of onset: 67) in her mother; Cirrhosis (age of onset: 74) in her father; Heart attack (age of onset: 29) in her maternal grandfather; Hyperlipidemia in her father; Hypertension in her mother; Hypertension (age of onset: 48) in her maternal grandmother; Kidney failure (age of onset: 84) in her father; Lung disease in her paternal grandmother; Stroke (age of onset: 21) in her maternal grandmother.   ROS General: Negative; No fevers, chills, or night sweats;  HEENT: Negative; No changes in vision or hearing, sinus congestion, difficulty swallowing Pulmonary: mild interstitial lung disease Cardiovascular: See HPI GI: Negative; No nausea, vomiting, diarrhea, or abdominal pain GU: Negative; No dysuria, hematuria, or difficulty voiding Musculoskeletal: Negative; no myalgias, joint pain, or weakness Rheumatologic: scleroderma Hematologic/Oncology: Negative; no easy bruising, bleeding Endocrine: Negative; no heat/cold intolerance; no diabetes Neuro: History of TIA Skin: Negative; No rashes or skin lesions Psychiatric: Negative; No behavioral problems, depression Sleep: Positive for snoring, no daytime sleepiness, hypersomnolence, bruxism, restless legs, hypnogognic hallucinations, no cataplexy Other comprehensive 14 point system review is negative.   PHYSICAL EXAM:   VS:  BP 114/60   Pulse 78   Ht 5'  8" (1.727 m)   Wt 176 lb 6.4 oz (80 kg)   SpO2 98%   BMI 26.82 kg/m     Repeat blood pressure by me was 122/68.  Wt Readings from Last 3 Encounters:  11/27/21 176 lb 6.4 oz (80 kg)  03/05/21 183 lb 3.2 oz (83.1 kg)  01/23/21 183 lb 9.6 oz (83.3 kg)    General: Alert, oriented, no distress.  Skin: normal turgor, no rashes, warm and dry HEENT: Normocephalic, atraumatic. Pupils equal round and reactive to light; sclera anicteric; extraocular muscles intact;  Nose without nasal septal hypertrophy Mouth/Parynx benign; Mallinpatti scale 3 Neck: No JVD, no carotid bruits; normal carotid upstroke Lungs: clear to ausculatation and percussion; no wheezing or rales Chest wall: without tenderness to palpitation Heart: PMI not displaced, RRR, s1 s2 normal, 1/6 systolic murmur, no diastolic murmur, no rubs, gallops, thrills, or heaves Abdomen: soft, nontender; no hepatosplenomehaly, BS+; abdominal aorta nontender and not dilated by palpation. Back: no CVA tenderness Pulses 2+ Musculoskeletal: full range of motion, normal strength, no joint deformities Extremities: no clubbing cyanosis or edema, Homan's sign negative  Neurologic: grossly nonfocal; Cranial nerves grossly wnl Psychologic: Normal mood and affect   Studies/Labs Reviewed:   November 27, 2021 ECG (independently read by me): NS at 78, mild sinus arrythmia  January 09, 2020 ECG (independently read by me): Normal sinus rhythm with mild sinus arrhythmia at 78 bpm.  Borderline first-degree block with a PR interval  206 ms;QTc interval 419 ms.  January 2021 ECG (independently read by me): Normal sinus rhythm at 75 bpm.  Poor anterior R wave progression V1 through V3.  No ectopy.  Normal intervals.  October 2020 ECG (independently read by me): NSR at 74; normal interval, no extopy  December 2019 ECG (independently read by me): Normal sinus rhythm at 82 bpm.  No ectopy.  Normal intervals.  Recent Labs:    Latest Ref Rng & Units  10/16/2020   12:00 AM 01/09/2020   10:25 AM 12/27/2018    1:55 PM  BMP  Glucose 65 - 99 mg/dL  97  88   BUN 4 - $R'21 22     13  18   'mc$ Creatinine 0.5 - 1.1 0.9     0.78  0.91   BUN/Creat Ratio 12 - 28  17  NOT APPLICABLE   Sodium 384 - 147 141     142  138   Potassium 3.4 - 5.3 4.3     4.4  4.3   Chloride 99 - 108 105     107  105   CO2 13 - $Re'22 27     25  24   'Iqg$ Calcium 8.7 - 10.7 10.1     10.1  10.2      This result is from an external source.        Latest Ref Rng & Units 10/16/2020   12:00 AM 01/09/2020   10:25 AM 12/27/2018    1:55 PM  Hepatic Function  Total Protein 6.0 - 8.5 g/dL  6.4  6.3   Albumin 3.5 - 5.0 4.7     4.5    AST 13 - 35 $Re'18     20  16   'jTJ$ ALT 7 - 35 $Re'25     28  20   'lzt$ Alk Phosphatase 25 - 125 65     68    Total Bilirubin 0.0 - 1.2 mg/dL  1.5  0.9      This result is from an external source.       Latest Ref Rng & Units 10/16/2020   12:00 AM 01/09/2020   10:25 AM 12/09/2018   11:04 AM  CBC  WBC  6.6     5.5  6.3   Hemoglobin 12.0 - 16.0 14.3     13.9  14.1   Hematocrit 36 - 46 43     39.6  40.6   Platelets 150 - 399 265     259  283      This result is from an external source.   Lab Results  Component Value Date   MCV 90 01/09/2020   MCV 90 12/09/2018   MCV 88.8 11/24/2017   Lab Results  Component Value Date   TSH 4.24 01/16/2021   No results found for: "HGBA1C"   BNP No results found for: "BNP"  ProBNP No results found for: "PROBNP"   Lipid Panel     Component Value Date/Time   CHOL 153 01/16/2021 1524   CHOL 146 01/09/2020 1025   TRIG 347.0 (H) 01/16/2021 1524   HDL 42.10 01/16/2021 1524   HDL 48 01/09/2020 1025   CHOLHDL 4 01/16/2021 1524   VLDL 69.4 (H) 01/16/2021 1524   LDLCALC 65 01/09/2020 1025   LDLDIRECT 73.0 01/16/2021 1524     RADIOLOGY: No results found.   Additional studies/ records that were reviewed today include:  I reviewed the patient's evaluations from Dr. Haroldine Laws and Chase Caller.  I reviewed her prior echo  Doppler studies, chest CT and laboratory.   ECHO 12/30/2018 IMPRESSIONS   1. Left ventricular ejection fraction, by visual estimation, is 60 to  65%. The left ventricle has normal function. There is no left ventricular  hypertrophy.   2. Left ventricular diastolic parameters are consistent with Grade I  diastolic dysfunction (impaired relaxation).   3. Global right ventricle has normal systolic function.The right  ventricular size is normal. No increase in right ventricular wall  thickness.   4. Left atrial size was normal.   5. Right atrial size was normal.   6. The mitral valve is normal in structure. Trace mitral valve  regurgitation.   7. The tricuspid valve is normal in structure. Tricuspid valve  regurgitation is trivial.   8. The aortic valve is normal in structure. Aortic valve regurgitation is  not visualized.   9. The pulmonic valve was grossly normal. Pulmonic valve regurgitation is  not visualized.  10. The inferior vena cava is normal in size with greater than 50%  respiratory variability, suggesting right atrial pressure of 3 mmHg.   ASSESSMENT:    No diagnosis found.   PLAN:  Savannah [name] is a 76 year-old female who has a history of hypertension, significant mixed hyperlipidemia with prior triglycerides at 669 in 2018, remote history of TIA, lower extremity varicose veins, as well as a biopsy-proven history of scleroderma.  She has not been found to have pulmonary hypertension.  She has mild interstitial lung disease which she has been seen by Dr. Chase Caller.  She has aortic atherosclerosis and multivessel plaque on a high-resolution chest CT.   Calcium score was 20 and there was evidence for mild plaque less than 30% in the LAD, circumflex, and RCA.  At a subsequent evaluation she was no longer taking her Livalo and was not taking Zetia on a daily basis.  Her LDL cholesterol in October 2020 was 94.  At that time I recommended resumption of Livalo 2 mg and she now  continues to take this 2 days/week.  I also recommended she take Zetia on an increased daily regimen if at all possible.  She has not had recent follow-up laboratory and I have suggested a comprehensive metabolic panel, CBC, TSH and fasting lipid studies to be obtained.  When last seen by me in November 2021 her blood pressure today is elevated and on repeat by me was 156/88.  Her resting pulse is 78.  She has been on losartan 100 mg daily and has been taking furosemide 40 mg on a as needed basis.  I have recommended the addition of low-dose metoprolol succinate 25 mg daily which should help her blood pressure and heart rate.  She is participating in a scleroderma study with Dr. Haroldine Laws.  She has had follow-up CT imaging and continues to be followed by Dr. Chase Caller for possible UIP/interstitial lung disease with most recent imaging not showing evidence for progression with minimal basilar reticular opacities.  Her most recent echo Doppler study in December 2022 was reviewed with her and demonstrated preserved LV systolic function with grade 1 diastolic dysfunction.  Unfortunately her husband passed away 3 weeks ago.  Her blood pressure today is stable.  She has been taking metoprolol succinate twice a day and I have suggested she consolidate and take 50 mg in the morning.  She continues to be on Zetia 10 mg and Livalo 2 mg twice a week in addition to Vascepa for her hyperlipidemia.  I  have recommended follow-up laboratory with a comprehensive metabolic panel, CBC, TSH, fasting lipid panel, and with recent mild glucose elevation we will check a hemoglobin A1c.  I will notify her of the results.  I will see her in 6 months for reevaluation or sooner as needed.   Medication Adjustments/Labs and Tests Ordered: Current medicines are reviewed at length with the patient today.  Concerns regarding medicines are outlined above.  Medication changes, Labs and Tests ordered today are listed in the Patient Instructions  below. There are no Patient Instructions on file for this visit. Time spent: 25 minutes Signed, Shelva Majestic, MD  11/27/2021 8:34 AM    [facility] [address] [place], [place], [place]  [address] Phone: [phone]

## 2021-11-29 ENCOUNTER — Encounter: Payer: Self-pay | Admitting: Cardiovascular Disease

## 2021-12-03 DIAGNOSIS — M9905 Segmental and somatic dysfunction of pelvic region: Secondary | ICD-10-CM | POA: Diagnosis not present

## 2021-12-03 DIAGNOSIS — M62830 Muscle spasm of back: Secondary | ICD-10-CM | POA: Diagnosis not present

## 2021-12-03 DIAGNOSIS — M9902 Segmental and somatic dysfunction of thoracic region: Secondary | ICD-10-CM | POA: Diagnosis not present

## 2021-12-03 DIAGNOSIS — M9903 Segmental and somatic dysfunction of lumbar region: Secondary | ICD-10-CM | POA: Diagnosis not present

## 2021-12-03 DIAGNOSIS — M9904 Segmental and somatic dysfunction of sacral region: Secondary | ICD-10-CM | POA: Diagnosis not present

## 2021-12-03 DIAGNOSIS — M9901 Segmental and somatic dysfunction of cervical region: Secondary | ICD-10-CM | POA: Diagnosis not present

## 2021-12-03 DIAGNOSIS — M542 Cervicalgia: Secondary | ICD-10-CM | POA: Diagnosis not present

## 2021-12-08 DIAGNOSIS — Z23 Encounter for immunization: Secondary | ICD-10-CM | POA: Diagnosis not present

## 2021-12-24 ENCOUNTER — Ambulatory Visit: Payer: Medicare Other | Admitting: Cardiovascular Disease

## 2022-01-02 DIAGNOSIS — H04123 Dry eye syndrome of bilateral lacrimal glands: Secondary | ICD-10-CM | POA: Diagnosis not present

## 2022-01-02 DIAGNOSIS — C50911 Malignant neoplasm of unspecified site of right female breast: Secondary | ICD-10-CM | POA: Diagnosis not present

## 2022-01-02 DIAGNOSIS — H2513 Age-related nuclear cataract, bilateral: Secondary | ICD-10-CM | POA: Diagnosis not present

## 2022-01-02 DIAGNOSIS — H35373 Puckering of macula, bilateral: Secondary | ICD-10-CM | POA: Diagnosis not present

## 2022-01-02 DIAGNOSIS — H16143 Punctate keratitis, bilateral: Secondary | ICD-10-CM | POA: Diagnosis not present

## 2022-01-02 DIAGNOSIS — H43813 Vitreous degeneration, bilateral: Secondary | ICD-10-CM | POA: Diagnosis not present

## 2022-01-16 DIAGNOSIS — Z170 Estrogen receptor positive status [ER+]: Secondary | ICD-10-CM | POA: Diagnosis not present

## 2022-01-16 DIAGNOSIS — M85872 Other specified disorders of bone density and structure, left ankle and foot: Secondary | ICD-10-CM | POA: Diagnosis not present

## 2022-01-16 DIAGNOSIS — C50211 Malignant neoplasm of upper-inner quadrant of right female breast: Secondary | ICD-10-CM | POA: Diagnosis not present

## 2022-01-27 DIAGNOSIS — M62830 Muscle spasm of back: Secondary | ICD-10-CM | POA: Diagnosis not present

## 2022-01-27 DIAGNOSIS — M9903 Segmental and somatic dysfunction of lumbar region: Secondary | ICD-10-CM | POA: Diagnosis not present

## 2022-01-27 DIAGNOSIS — M9905 Segmental and somatic dysfunction of pelvic region: Secondary | ICD-10-CM | POA: Diagnosis not present

## 2022-01-27 DIAGNOSIS — M542 Cervicalgia: Secondary | ICD-10-CM | POA: Diagnosis not present

## 2022-01-27 DIAGNOSIS — M9902 Segmental and somatic dysfunction of thoracic region: Secondary | ICD-10-CM | POA: Diagnosis not present

## 2022-01-27 DIAGNOSIS — M9904 Segmental and somatic dysfunction of sacral region: Secondary | ICD-10-CM | POA: Diagnosis not present

## 2022-01-27 DIAGNOSIS — M9901 Segmental and somatic dysfunction of cervical region: Secondary | ICD-10-CM | POA: Diagnosis not present

## 2022-02-04 DIAGNOSIS — Z85828 Personal history of other malignant neoplasm of skin: Secondary | ICD-10-CM | POA: Diagnosis not present

## 2022-02-04 DIAGNOSIS — D225 Melanocytic nevi of trunk: Secondary | ICD-10-CM | POA: Diagnosis not present

## 2022-02-04 DIAGNOSIS — L821 Other seborrheic keratosis: Secondary | ICD-10-CM | POA: Diagnosis not present

## 2022-02-04 DIAGNOSIS — L820 Inflamed seborrheic keratosis: Secondary | ICD-10-CM | POA: Diagnosis not present

## 2022-02-04 DIAGNOSIS — L814 Other melanin hyperpigmentation: Secondary | ICD-10-CM | POA: Diagnosis not present

## 2022-02-13 ENCOUNTER — Encounter: Payer: Self-pay | Admitting: Cardiovascular Disease

## 2022-03-07 DIAGNOSIS — Z780 Asymptomatic menopausal state: Secondary | ICD-10-CM | POA: Diagnosis not present

## 2022-03-07 DIAGNOSIS — R928 Other abnormal and inconclusive findings on diagnostic imaging of breast: Secondary | ICD-10-CM | POA: Diagnosis not present

## 2022-03-07 DIAGNOSIS — Z13820 Encounter for screening for osteoporosis: Secondary | ICD-10-CM | POA: Diagnosis not present

## 2022-03-07 DIAGNOSIS — Z853 Personal history of malignant neoplasm of breast: Secondary | ICD-10-CM | POA: Diagnosis not present

## 2022-03-07 DIAGNOSIS — Z923 Personal history of irradiation: Secondary | ICD-10-CM | POA: Diagnosis not present

## 2022-03-07 DIAGNOSIS — R92323 Mammographic fibroglandular density, bilateral breasts: Secondary | ICD-10-CM | POA: Diagnosis not present

## 2022-03-07 DIAGNOSIS — M8589 Other specified disorders of bone density and structure, multiple sites: Secondary | ICD-10-CM | POA: Diagnosis not present

## 2022-03-07 DIAGNOSIS — Z9889 Other specified postprocedural states: Secondary | ICD-10-CM | POA: Diagnosis not present

## 2022-04-16 ENCOUNTER — Other Ambulatory Visit: Payer: Self-pay | Admitting: Cardiovascular Disease

## 2022-04-16 ENCOUNTER — Encounter: Payer: Self-pay | Admitting: Cardiovascular Disease

## 2022-04-16 ENCOUNTER — Other Ambulatory Visit: Payer: Self-pay

## 2022-04-16 MED ORDER — LOSARTAN POTASSIUM 100 MG PO TABS: 100.0000 mg | ORAL_TABLET | Freq: Every day | ORAL | 1 refills | Status: DC

## 2022-04-16 NOTE — Telephone Encounter (Signed)
Called pt. Refill sent to preferred pharmacy and appt made for May 1st. Prescription sent to pharmacy.

## 2022-05-05 ENCOUNTER — Telehealth: Payer: Self-pay | Admitting: Internal Medicine

## 2022-05-05 NOTE — Telephone Encounter (Signed)
Scleroderma ILD patient. Not seen since Jan 2023. Wa suppsoed to see back in 6-9 months. Last CT dec 2022. Last PFT Dec 2022    reports that she has never smoked. She has never used smokeless tobacco.   Plan  - get spiro.dlco and see me first avail

## 2022-05-13 ENCOUNTER — Telehealth: Payer: Self-pay

## 2022-05-13 NOTE — Telephone Encounter (Signed)
Paper Work Dropped Off: Refusal Paperwork from [facility] for Dr Claiborne Billings  Date: 05-13-22  Location of paper:  Took to Triage to [facility]   05-13-22 VB

## 2022-05-19 ENCOUNTER — Other Ambulatory Visit: Payer: Self-pay | Admitting: Cardiovascular Disease

## 2022-05-21 ENCOUNTER — Encounter: Payer: Self-pay | Admitting: Cardiovascular Disease

## 2022-05-23 ENCOUNTER — Other Ambulatory Visit (HOSPITAL_COMMUNITY): Payer: Self-pay

## 2022-05-23 MED ORDER — ICOSAPENT ETHYL 1 G PO CAPS: ORAL_CAPSULE | ORAL | 2 refills | Status: DC

## 2022-05-23 NOTE — Telephone Encounter (Signed)
Spoke to patient and let her know the new RX will be sent to pharm,acy.  Verified [facility].  Sent and she will pick up soon

## 2022-05-23 NOTE — Telephone Encounter (Signed)
Hi Teresa, I got a paid test claim so PA shouldn't be required. Called [facility] and gave them the right NDC to process and it went through on their end. The script they have on file from August is for the previous dosing schedule. Please send in a new script with updated frequency so they have that for their records.

## 2022-06-05 ENCOUNTER — Other Ambulatory Visit: Payer: Self-pay | Admitting: Cardiovascular Disease

## 2022-06-13 DIAGNOSIS — H903 Sensorineural hearing loss, bilateral: Secondary | ICD-10-CM | POA: Diagnosis not present

## 2022-06-18 ENCOUNTER — Encounter: Payer: Self-pay | Admitting: Cardiovascular Disease

## 2022-06-18 ENCOUNTER — Telehealth: Payer: Self-pay | Admitting: Radiology

## 2022-06-18 ENCOUNTER — Ambulatory Visit: Payer: Medicare Other | Attending: Cardiovascular Disease | Admitting: Cardiovascular Disease

## 2022-06-18 VITALS — BP 132/70 | HR 79 | Ht 66.0 in | Wt 182.0 lb

## 2022-06-18 DIAGNOSIS — E782 Mixed hyperlipidemia: Secondary | ICD-10-CM

## 2022-06-18 DIAGNOSIS — I5189 Other ill-defined heart diseases: Secondary | ICD-10-CM | POA: Diagnosis not present

## 2022-06-18 DIAGNOSIS — J849 Interstitial pulmonary disease, unspecified: Secondary | ICD-10-CM | POA: Diagnosis not present

## 2022-06-18 DIAGNOSIS — Z8673 Personal history of transient ischemic attack (TIA), and cerebral infarction without residual deficits: Secondary | ICD-10-CM | POA: Diagnosis not present

## 2022-06-18 DIAGNOSIS — I10 Essential (primary) hypertension: Secondary | ICD-10-CM

## 2022-06-18 DIAGNOSIS — M349 Systemic sclerosis, unspecified: Secondary | ICD-10-CM | POA: Diagnosis not present

## 2022-06-18 MED ORDER — OLMESARTAN MEDOXOMIL 40 MG PO TABS: 40.0000 mg | ORAL_TABLET | Freq: Every day | ORAL | 3 refills | Status: DC

## 2022-06-18 NOTE — Telephone Encounter (Signed)
Contacted Savannah [name] to schedule their annual wellness visit. Pt states she will call back she was currently driving.  Savannah [name] K. CMA

## 2022-06-18 NOTE — Progress Notes (Signed)
Cardiology Office Note    Date:  06/18/2022   ID:  Savannah [name], DOB 29-May-1945, MRN [id number]  PCP:  Etta Grandchild, MD  Cardiologist:  Nicki Guadalajara, MD   No chief complaint on file.  6 month F/U cardiology evaluation   History of Present Illness:  Savannah [name] is a 77 y.o. female who presents for a 28month f/u cardiology evaluation.  Ms. 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  She has biopsy-proven scleroderma biopsy on her hand around 2005.  She has had issues with shortness of breath in the past.  In 2008, her ejection fraction was 45 to 55% by echocardiography and a Myoview study November 2008 was negative for ischemia with hyperdynamic post stress ejection fraction.  She has been seen by Dr. Gala Romney.  On CT imaging she was found to have mild interstitial lung disease for which she subsequently was referred to Dr. Marchelle Gearing.  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  On that CT she was also noted to have aortic atherosclerosis in addition to left main and three-vessel coronary artery disease.  However this was not gated and assessment of obstructive stenoses could not be made.  On December 23, 2017 she underwent a follow-up echo Doppler study which showed an EF of 55 to 60%.  LV strain was normal at -17.4%.  She did not have regional wall motion abnormalities.  She had mild calcification of her aortic leaflets.  There is trivial MR and PR.  PA pressures were not increased.  She last saw Dr. Gala Romney in November 2019 and presents now for cardiology care and risk management.  She has a history of significant mixed hyperlipidemia and is on Vascepa and Livalo, apparently has only been taking Livalo maybe once or at most twice per week.  Admits  to rare to occasional palpitations.  She denies recent chest tightness.  She denies presyncope or syncope.  I have cared for her husband.  She has a history of snoring but states her sleep is restorative.  She typically goes to [place] from mid January through June.  When I saw her in December 2019 I recommended that she undergo coronary CT angiography to further elucidate her coronary anatomy after she was found to have plaque on her chest CT.  This was performed on March 01, 2018.  Her calcium score was 20 which was only 44 percentile for age and sex.  There was mild plaque noted with less than 30% stenosis in the proximal and mid LAD, circumflex and right coronary arteries.  She had a normal aortic root at 3.3 cm.  I also recommended Vascepa in addition to Livalo for her mixed hyperlipidemia and added Zetia for an additional 20% lowering.  She went to [place] from January until October and returned last week.  Apparently she has only been taking Zetia 2 times per week and is only been taking Vascepa 2 times per week.  She has not had relaboratory.  She is no longer taking Livalo and apparently stopped taking it.  She denied chest pain PND orthopnea.  She will again begin to [place] in early 2021.    Since I last saw her, she had laboratory in October 2020.  Total cholesterol was 167, triglycerides 185, HDL  41, LDL 94.  At that time, I had suggested the patient increase Zetia to 10 mg daily and also increase Vascepa to daily and she had only been taking these medicines 2 days/week.  She was evaluated by Dr. Gala Romney in November 2020.  An echo Doppler study December 30, 2018 showed EF 60 to 65% with grade 1 diastolic dysfunction.  Atrial size was normal.  She was felt to be well compensated guarding her scleroderma with related mild interstitial lung disease.  Her echo Doppler study there was no evidence for pulmonary hypertension or RV strain.  Her PFTs showed stable spirometry and improved DLCO.  A high  resolution CT in October 2020 showed mild ILD slightly improved from previous assessment.  She was noted to have coronary calcification on CT imaging.  She was active without chest pain or exertional dyspnea and preferred avoiding further testing.  I saw her in January 2021 prior to her returning back to [place].  At that time she denied any chest pain, PND orthopnea.  She had  been taking losartan 100 mg daily.  She has a prescription for furosemide but she has not required use and had not taken this recently.  Apparently she was still only taking Zetia 10 mg 2 days/week and Vascepa 1 g by mouth once a week.  During that evaluation, I again reviewed her coronary CTA which revealed a calcium score of 20 and mild plaque in the LAD, circumflex and RCA.  At that time she was no longer taking Livalo but had tolerated that well in the past and I recommended resumption of Livalo 2 mg and to try initiating this at least 2 days/week.  I also recommended she try increasing Zetia if not to 10 mg daily then at least every other day.  I last saw her on January 09, 2020.  She had recently returned from [place] with her husband.  Her blood pressure had been somewhat labile with systolic pressure ranging from 1 20-1 60.  On one occasion it had reached 172 mmHg.  She denies chest pain or significant shortness of breath.  She is scheduled to undergo pulmonary function studies on November 30 as well as a follow-up echo Doppler study on December 6.  She is also followed by Dr. Gala Romney for her scleroderma study and has a follow-up office visit with Dr. Marchelle Gearing regarding her interstitial lung disease.    She was evaluated by Edd Fabian in November 2022.  It was very active taking care of her husband who had severe Parkinson's disease.  She underwent a follow-up high-resolution chest CT on February 05, 2021 ordered by Dr. Marchelle Gearing which showed minimal basilar reticular opacities with no evidence for progression.  The  findings were indeterminate for UIP.  There was bilateral air trapping which may be seen in the setting of small airways disease.  She was noted to have coronary artery calcifications and aortic atherosclerosis.  On February 12, 2021 she underwent a follow-up echo Doppler which showed EF 60 to 65% and grade 1 diastolic dysfunction.  PA pressure was normal and valves essentially were normal.    I last saw her on November 27, 2021. Unfortunately her husband Zuzanna Maroney who also was one of my patients died on 2021/12/09 at 64 years old.  She will be going down to [place] with a friend to help her with the department down there but will be returning back to [place].  She denies any chest pain.  She notes  rare indigestion.  She continues to have scleroderma and at times notes trace ankle swelling.  She has not had recent laboratory.  She states her blood pressure at home typically runs in the 130s to 150s.  She had recently been started on metoprolol succinate and was taking this 25 mg twice a day.    Since I last saw her, she has felt well.  She continues to go down to [place] every several months.  She has been on losartan 100 mg metoprolol succinate 50 mg daily for hypertension.  She brought with her blood pressure recordings, blood pressure oftentimes is mildly elevated.  Her blood pressures have ranged from systolics in the 1 teens and 120s to systolics in the 150s 160s and rarely 170s.  Oftentimes she may note her higher blood pressure in the morning.  Denies any chest pain or shortness of breath.  She is unaware of palpitations.  She has not had laboratory since October 2023 which showed total cholesterol 139 HDL 48, LDL 63 and triglycerides 167.  She apparently has only been taking Vascepa 1 capsule 3 times a week and Zetia 3 times per week Livalo 2 mg 3 times a week.  She is on aspirin 81 mg.  She presents for evaluation.    Past Medical History:  Diagnosis Date   Dyspnea    MYOVIEW,  12/29/2006 - post-stress EF 81%,no ECG changes, EKG negative for ischemia   Hyperlipidemia    Hypertension    2D ECHO, 12/24/2006 - EF 45-55%, normal   Osteoarthritis    Swelling of extremity    LEA VENOUS DUPLEX, 08/19/2011 - deep valvular insufficiency noted with right femoral and popliteal veins and left common femoral vein   TIA (transient ischemic attack)    CAROTID DOPPLER, 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    Past Surgical History:  Procedure Laterality Date   TONSILLECTOMY      Current Medications: Outpatient Medications Prior to Visit  Medication Sig Dispense Refill   anastrozole (ARIMIDEX) 1 MG tablet Take 1 mg by mouth every morning.     aspirin 81 MG tablet Take 81 mg by mouth.     b complex vitamins capsule Take 1 capsule by mouth 2 (two) times a week.     Cholecalciferol (D3 MAXIMUM STRENGTH PO) Take 5,000 Units by mouth 2 (two) times a week.      Coenzyme Q10 200 MG capsule Take 200 mg by mouth daily.     diclofenac (VOLTAREN) 0.1 % ophthalmic solution Place 1 drop into the left eye 2 (two) times daily.     ezetimibe (ZETIA) 10 MG tablet Take 10 mg by mouth 2 (two) times a week.     furosemide (LASIX) 20 MG tablet TAKE 20 MG( 1/2 TABLET ) TO 40 MG ( 1 TABLET) AS NEEDED FOR FLUID OR EDEMA 180 tablet 1   loratadine (CLARITIN) 10 MG tablet Take 10 mg by mouth daily as needed for allergies.     Magnesium 300 MG CAPS Take 300 mg by mouth 3 (three) times a week.     MAGNESIUM CL-CALCIUM CARBONATE PO Take 1,000 mg by mouth daily.     Menaquinone-7 (VITAMIN K2 PO) Take 50 mcg by mouth 2 (two) times a week.      metoprolol succinate (TOPROL-XL) 50 MG 24 hr tablet Take 1 tablet (50 mg total) by mouth daily. Take with or immediately following a meal. 90 tablet 3   Pitavastatin Calcium 2 MG  TABS TAKE 1 TABLET BY MOUTH TWICE WEEKLY 26 tablet 13   potassium chloride SA (KLOR-CON M20) 20 MEQ tablet Take 1 tablet (20 mEq total) by mouth daily as needed.  When you take Furosemide (Lasix). 90 tablet 3   valACYclovir (VALTREX) 1000 MG tablet TAKE 1 TABLET BY MOUTH TWICE A DAY 180 tablet 1   VASCEPA 1 g capsule TAKE 1 CAPSULE (1 G TOTAL) BY MOUTH 2 (TWO) TIMES A WEEK. 36 capsule 2   vitamin E 180 MG (400 UNITS) capsule Take 400 Units by mouth daily.     losartan (COZAAR) 100 MG tablet TAKE 1 TABLET(100 MG) BY MOUTH DAILY 30 tablet 1   Calcium Carbonate (CALCIUM 600 PO) Take by mouth 3 (three) times a week. (Patient not taking: Reported on 06/18/2022)     icosapent Ethyl (VASCEPA) 1 g capsule TAKE 1 CAPSULE (1 G TOTAL) BY MOUTH 3 (THREE) TIMES A WEEK (Patient not taking: Reported on 06/18/2022) 90 capsule 2   No facility-administered medications prior to visit.     Allergies:   Codeine, Cymbalta [duloxetine hcl], Lyrica [pregabalin], Penicillins, Sulfa antibiotics, Zetia [ezetimibe], and Zocor [simvastatin]   Social History   Socioeconomic History   Marital status: Married    Spouse name: Not on file   Number of children: Not on file   Years of education: Not on file   Highest education level: Not on file  Occupational History   Not on file  Tobacco Use   Smoking status: Never   Smokeless tobacco: Never  Vaping Use   Vaping Use: Never used  Substance and Sexual Activity   Alcohol use: Yes    Alcohol/week: 6.0 standard drinks of alcohol    Types: 6 drink(s) per week   Drug use: No   Sexual activity: Not on file  Other Topics Concern   Not on file  Social History Narrative   Not on file   Social Determinants of Health   Financial Resource Strain: Not on file  Food Insecurity: Not on file  Transportation Needs: Not on file  Physical Activity: Not on file  Stress: Not on file  Social Connections: Not on file     Family History:  The patient's family history includes Angina (age of onset: 39) in her maternal grandfather; Atrial fibrillation (age of onset: 58) in her mother; Cirrhosis (age of onset: 70) in her father; Heart attack  (age of onset: 68) in her maternal grandfather; Hyperlipidemia in her father; Hypertension in her mother; Hypertension (age of onset: 29) in her maternal grandmother; Kidney failure (age of onset: 60) in her father; Lung disease in her paternal grandmother; Stroke (age of onset: 68) in her maternal grandmother.   ROS General: Negative; No fevers, chills, or night sweats;  HEENT: Negative; No changes in vision or hearing, sinus congestion, difficulty swallowing Pulmonary: mild interstitial lung disease Cardiovascular: See HPI GI: Negative; No nausea, vomiting, diarrhea, or abdominal pain GU: Negative; No dysuria, hematuria, or difficulty voiding Musculoskeletal: Negative; no myalgias, joint pain, or weakness Rheumatologic: scleroderma Hematologic/Oncology: Negative; no easy bruising, bleeding Endocrine: Negative; no heat/cold intolerance; no diabetes Neuro: History of TIA Skin: Negative; No rashes or skin lesions Psychiatric: Negative; No behavioral problems, depression Sleep: Positive for snoring, no daytime sleepiness, hypersomnolence, bruxism, restless legs, hypnogognic hallucinations, no cataplexy Other comprehensive 14 point system review is negative.   PHYSICAL EXAM:   VS:  BP 132/70   Pulse 79   Ht 5\' 6"  (1.676 m)   Wt 182 lb (  82.6 kg)   SpO2 97%   BMI 29.38 kg/m     Repeat blood pressure by me was 128/70  Wt Readings from Last 3 Encounters:  06/18/22 182 lb (82.6 kg)  11/27/21 176 lb 6.4 oz (80 kg)  03/05/21 183 lb 3.2 oz (83.1 kg)    General: Alert, oriented, no distress.  Skin: normal turgor, no rashes, warm and dry HEENT: Normocephalic, atraumatic. Pupils equal round and reactive to light; sclera anicteric; extraocular muscles intact Nose without nasal septal hypertrophy Mouth/Parynx benign; Mallinpatti scale 3 Neck: No JVD, no carotid bruits; normal carotid upstroke Lungs: clear to ausculatation and percussion; no wheezing or rales Chest wall: without tenderness  to palpitation Heart: PMI not displaced, RRR, s1 s2 normal, 1/6 systolic murmur, no diastolic murmur, no rubs, gallops, thrills, or heaves Abdomen: soft, nontender; no hepatosplenomehaly, BS+; abdominal aorta nontender and not dilated by palpation. Back: no CVA tenderness Pulses 2+ Musculoskeletal: full range of motion, normal strength, no joint deformities Extremities: no clubbing cyanosis or edema, Homan's sign negative  Neurologic: grossly nonfocal; Cranial nerves grossly wnl Psychologic: Normal mood and affect    Studies/Labs Reviewed:   ECG (independently read by me): Normal sinus rhythm at 79 bpm.  Small Q wave lead III and aVF.  No ectopy.  No ST segment changes.  November 27, 2021 ECG (independently read by me): NS at 78, mild sinus arrythmia  January 09, 2020 ECG (independently read by me): Normal sinus rhythm with mild sinus arrhythmia at 78 bpm.  Borderline first-degree block with a PR interval 206 ms;QTc interval 419 ms.  January 2021 ECG (independently read by me): Normal sinus rhythm at 75 bpm.  Poor anterior R wave progression V1 through V3.  No ectopy.  Normal intervals.  October 2020 ECG (independently read by me): NSR at 74; normal interval, no extopy  December 2019 ECG (independently read by me): Normal sinus rhythm at 82 bpm.  No ectopy.  Normal intervals.  Recent Labs:    Latest Ref Rng & Units 11/27/2021    9:33 AM 10/16/2020   12:00 AM 01/09/2020   10:25 AM  BMP  Glucose 70 - 99 mg/dL 161   97   BUN 8 - 27 mg/dL 13  22     13    Creatinine 0.57 - 1.00 mg/dL 0.96  0.9     0.45   BUN/Creat Ratio 12 - 28 14   17    Sodium 134 - 144 mmol/L 140  141     142   Potassium 3.5 - 5.2 mmol/L 4.8  4.3     4.4   Chloride 96 - 106 mmol/L 103  105     107   CO2 20 - 29 mmol/L 26  27     25    Calcium 8.7 - 10.3 mg/dL 40.9  81.1     91.4      This result is from an external source.        Latest Ref Rng & Units 11/27/2021    9:33 AM 10/16/2020   12:00 AM 01/09/2020    10:25 AM  Hepatic Function  Total Protein 6.0 - 8.5 g/dL 6.4   6.4   Albumin 3.8 - 4.8 g/dL 4.4  4.7     4.5   AST 0 - 40 IU/L 22  18     20    ALT 0 - 32 IU/L 29  25     28    Alk Phosphatase 44 - 121 IU/L 70  65     68   Total Bilirubin 0.0 - 1.2 mg/dL 1.3   1.5      This result is from an external source.       Latest Ref Rng & Units 11/27/2021    9:33 AM 10/16/2020   12:00 AM 01/09/2020   10:25 AM  CBC  WBC 3.4 - 10.8 x10E3/uL 6.1  6.6     5.5   Hemoglobin 11.1 - 15.9 g/dL 45.4  09.8     11.9   Hematocrit 34.0 - 46.6 % 41.2  43     39.6   Platelets 150 - 450 x10E3/uL 278  265     259      This result is from an external source.   Lab Results  Component Value Date   MCV 90 11/27/2021   MCV 90 01/09/2020   MCV 90 12/09/2018   Lab Results  Component Value Date   TSH 2.980 11/27/2021   Lab Results  Component Value Date   HGBA1C 5.2 11/27/2021     BNP No results found for: "BNP"  ProBNP No results found for: "PROBNP"   Lipid Panel     Component Value Date/Time   CHOL 139 11/27/2021 0933   TRIG 167 (H) 11/27/2021 0933   HDL 48 11/27/2021 0933   CHOLHDL 2.9 11/27/2021 0933   CHOLHDL 4 01/16/2021 1524   VLDL 69.4 (H) 01/16/2021 1524   LDLCALC 63 11/27/2021 0933   LDLDIRECT 73.0 01/16/2021 1524     RADIOLOGY: No results found.   Additional studies/ records that were reviewed today include:  I reviewed the patient's evaluations from Dr. Gala Romney and Marchelle Gearing.  I reviewed her prior echo Doppler studies, chest CT and laboratory.   ECHO 12/30/2018 IMPRESSIONS   1. Left ventricular ejection fraction, by visual estimation, is 60 to  65%. The left ventricle has normal function. There is no left ventricular  hypertrophy.   2. Left ventricular diastolic parameters are consistent with Grade I  diastolic dysfunction (impaired relaxation).   3. Global right ventricle has normal systolic function.The right  ventricular size is normal. No increase in right  ventricular wall  thickness.   4. Left atrial size was normal.   5. Right atrial size was normal.   6. The mitral valve is normal in structure. Trace mitral valve  regurgitation.   7. The tricuspid valve is normal in structure. Tricuspid valve  regurgitation is trivial.   8. The aortic valve is normal in structure. Aortic valve regurgitation is  not visualized.   9. The pulmonic valve was grossly normal. Pulmonic valve regurgitation is  not visualized.  10. The inferior vena cava is normal in size with greater than 50%  respiratory variability, suggesting right atrial pressure of 3 mmHg.   ASSESSMENT:    1. Primary hypertension   2. Mixed hyperlipidemia   3. Scleroderma (HCC)   4. History of TIA (transient ischemic attack)   5. Diastolic dysfunction   6. ILD (interstitial lung disease) [facility])     PLAN:  Ms. Alishah Schulte is a 77 year-old female who has a history of hypertension, significant mixed hyperlipidemia with prior triglycerides at 669 in 2018, remote history of TIA, lower extremity varicose veins, as well as a biopsy-proven history of scleroderma.  She has not been found to have pulmonary hypertension.  She has mild interstitial lung disease which she has been seen by Dr. Marchelle Gearing.  She has aortic atherosclerosis and multivessel plaque on a high-resolution  chest CT.   Calcium score was 20 and there was evidence for mild plaque less than 30% in the LAD, circumflex, and RCA.  At a subsequent evaluation she was no longer taking her Livalo and was not taking Zetia on a daily basis.  Her LDL cholesterol in October 2020 was 94.  At that time I recommended resumption of Livalo 2 mg.  Most recently, she has been taking her lipid-lowering therapy 3 days/week which now include Vascepa 1 g, Livalo 2 mg, and Zetia 10 mg.  Triglycerides were elevated 67 in October 2023 which had significantly improved from 347 in November 2022.  Since that last visit, she had increased Vascepa from 2-3 times per  day.  She has not like to take her lipid-lowering medicines daily since believes it affects her muscles.  She has had some blood pressure lability on her current regimen of losartan 100 mg.  She has a prescription for furosemide but only takes this as needed.  I have suggested she runs out of her current losartan 100 mg we will substitute this for the olmesartan 40 mg which may have longer durability and improved blood pressure control at the 40 mg dose.  Continues to be asymptomatic and denies chest pain or shortness of breath.  She has nondiagnostic Q waves on ECG today and is in sinus rhythm.  Able.  In 4 to 6 weeks I have recommended she undergo follow-up comprehensive metabolic panel, lipid panel and will obtain a CBC.  TSH was normal at 2.98 in October 2023.  She sees Dr. Sanda Linger for primary care but does not see him regularly.  I will see her in 8 to 9 months for follow-up evaluation or sooner as needed.   Medication Adjustments/Labs and Tests Ordered: Current medicines are reviewed at length with the patient today.  Concerns regarding medicines are outlined above.  Medication changes, Labs and Tests ordered today are listed in the Patient Instructions below. Patient Instructions  Medication Instructions:  When you finish taking Losartan stop and start taking Olmesartan 40 mg daily Continue all other medications *If you need a refill on your cardiac medications before your next appointment, please call your pharmacy*   Lab Work: Cmet,lipid panel,cbc  have done in 1 month    Lab order enclosed  Testing/Procedures: None ordered   Follow-Up: At [facility], you and your health needs are our priority.  As part of our continuing mission to provide you with exceptional heart care, we have created designated Provider Care Teams.  These Care Teams include your primary Cardiologist (physician) and Advanced Practice Providers (APPs -  Physician Assistants and Nurse Practitioners) who  all work together to provide you with the care you need, when you need it.  We recommend signing up for the patient portal called "MyChart".  Sign up information is provided on this After Visit Summary.  MyChart is used to connect with patients for Virtual Visits (Telemedicine).  Patients are able to view lab/test results, encounter notes, upcoming appointments, etc.  Non-urgent messages can be sent to your provider as well.   To learn more about what you can do with MyChart, go to [email or web address].    Your next appointment:  8 or 9 months    Provider:  Kandice Robinsons    Signed, Nicki Guadalajara, MD  06/18/2022 1:56 PM    [facility] [address], [address], [place], [place]  [address] Phone: [phone]

## 2022-06-18 NOTE — Patient Instructions (Addendum)
Medication Instructions:  When you finish taking Losartan stop and start taking Olmesartan 40 mg daily Continue all other medications *If you need a refill on your cardiac medications before your next appointment, please call your pharmacy*   Lab Work: Cmet,lipid panel,cbc  have done in 1 month    Lab order enclosed  Testing/Procedures: None ordered   Follow-Up: At [facility], you and your health needs are our priority.  As part of our continuing mission to provide you with exceptional heart care, we have created designated Provider Care Teams.  These Care Teams include your primary Cardiologist (physician) and Advanced Practice Providers (APPs -  Physician Assistants and Nurse Practitioners) who all work together to provide you with the care you need, when you need it.  We recommend signing up for the patient portal called "MyChart".  Sign up information is provided on this After Visit Summary.  MyChart is used to connect with patients for Virtual Visits (Telemedicine).  Patients are able to view lab/test results, encounter notes, upcoming appointments, etc.  Non-urgent messages can be sent to your provider as well.   To learn more about what you can do with MyChart, go to [email or web address].    Your next appointment:  8 or 9 months    Provider:  Kandice Robinsons

## 2022-07-01 DIAGNOSIS — Z170 Estrogen receptor positive status [ER+]: Secondary | ICD-10-CM | POA: Diagnosis not present

## 2022-07-01 DIAGNOSIS — C50211 Malignant neoplasm of upper-inner quadrant of right female breast: Secondary | ICD-10-CM | POA: Diagnosis not present

## 2022-07-01 DIAGNOSIS — C50911 Malignant neoplasm of unspecified site of right female breast: Secondary | ICD-10-CM | POA: Diagnosis not present

## 2022-07-03 DIAGNOSIS — M25562 Pain in left knee: Secondary | ICD-10-CM | POA: Diagnosis not present

## 2022-07-03 DIAGNOSIS — H04123 Dry eye syndrome of bilateral lacrimal glands: Secondary | ICD-10-CM | POA: Diagnosis not present

## 2022-07-03 DIAGNOSIS — M1712 Unilateral primary osteoarthritis, left knee: Secondary | ICD-10-CM | POA: Diagnosis not present

## 2022-07-03 DIAGNOSIS — M25561 Pain in right knee: Secondary | ICD-10-CM | POA: Diagnosis not present

## 2022-07-03 DIAGNOSIS — H43813 Vitreous degeneration, bilateral: Secondary | ICD-10-CM | POA: Diagnosis not present

## 2022-07-03 DIAGNOSIS — H2513 Age-related nuclear cataract, bilateral: Secondary | ICD-10-CM | POA: Diagnosis not present

## 2022-07-03 DIAGNOSIS — M1711 Unilateral primary osteoarthritis, right knee: Secondary | ICD-10-CM | POA: Diagnosis not present

## 2022-07-03 DIAGNOSIS — H35373 Puckering of macula, bilateral: Secondary | ICD-10-CM | POA: Diagnosis not present

## 2022-07-04 DIAGNOSIS — H903 Sensorineural hearing loss, bilateral: Secondary | ICD-10-CM | POA: Diagnosis not present

## 2022-07-30 DIAGNOSIS — I10 Essential (primary) hypertension: Secondary | ICD-10-CM | POA: Diagnosis not present

## 2022-07-30 DIAGNOSIS — I38 Endocarditis, valve unspecified: Secondary | ICD-10-CM | POA: Diagnosis not present

## 2022-07-30 DIAGNOSIS — I5032 Chronic diastolic (congestive) heart failure: Secondary | ICD-10-CM | POA: Diagnosis not present

## 2022-07-30 DIAGNOSIS — E782 Mixed hyperlipidemia: Secondary | ICD-10-CM | POA: Diagnosis not present

## 2022-07-31 LAB — LIPID PANEL
Chol/HDL Ratio: 4.1 ratio (ref 0.0–4.4)
Cholesterol, Total: 231 mg/dL — ABNORMAL HIGH (ref 100–199)
HDL: 57 mg/dL (ref >39)
LDL Chol Calc (NIH): 140 mg/dL — ABNORMAL HIGH (ref 0–99)
Triglycerides: 193 mg/dL — ABNORMAL HIGH (ref 0–149)
VLDL Cholesterol Cal: 34 mg/dL (ref 5–40)

## 2022-07-31 LAB — COMPREHENSIVE METABOLIC PANEL
ALT: 14 IU/L (ref 0–32)
AST: 13 IU/L (ref 0–40)
Albumin/Globulin Ratio: 1.9
Albumin: 4.3 g/dL (ref 3.8–4.8)
Alkaline Phosphatase: 69 IU/L (ref 44–121)
BUN/Creatinine Ratio: 15 (ref 12–28)
BUN: 16 mg/dL (ref 8–27)
Bilirubin Total: 1.1 mg/dL (ref 0.0–1.2)
CO2: 26 mmol/L (ref 20–29)
Calcium: 10.6 mg/dL — ABNORMAL HIGH (ref 8.7–10.3)
Chloride: 104 mmol/L (ref 96–106)
Creatinine, Ser: 1.09 mg/dL — ABNORMAL HIGH (ref 0.57–1.00)
Globulin, Total: 2.3 g/dL (ref 1.5–4.5)
Glucose: 96 mg/dL (ref 70–99)
Potassium: 4.6 mmol/L (ref 3.5–5.2)
Sodium: 141 mmol/L (ref 134–144)
Total Protein: 6.6 g/dL (ref 6.0–8.5)
eGFR: 52 mL/min/{1.73_m2} — ABNORMAL LOW (ref >59)

## 2022-07-31 LAB — CBC WITH DIFFERENTIAL/PLATELET
Basophils Absolute: 0.1 10*3/uL (ref 0.0–0.2)
Basos: 1 %
EOS (ABSOLUTE): 0.1 10*3/uL (ref 0.0–0.4)
Eos: 2 %
Hematocrit: 42.6 % (ref 34.0–46.6)
Hemoglobin: 14.9 g/dL (ref 11.1–15.9)
Immature Grans (Abs): 0.1 10*3/uL (ref 0.0–0.1)
Immature Granulocytes: 1 %
Lymphocytes Absolute: 1.9 10*3/uL (ref 0.7–3.1)
Lymphs: 29 %
MCH: 31.9 pg (ref 26.6–33.0)
MCHC: 35 g/dL (ref 31.5–35.7)
MCV: 91 fL (ref 79–97)
Monocytes Absolute: 0.4 10*3/uL (ref 0.1–0.9)
Monocytes: 7 %
Neutrophils Absolute: 3.9 10*3/uL (ref 1.4–7.0)
Neutrophils: 60 %
Platelets: 274 10*3/uL (ref 150–450)
RBC: 4.67 x10E6/uL (ref 3.77–5.28)
RDW: 12.9 % (ref 11.7–15.4)
WBC: 6.4 10*3/uL (ref 3.4–10.8)

## 2022-08-05 ENCOUNTER — Encounter: Payer: Self-pay | Admitting: Cardiovascular Disease

## 2022-08-20 ENCOUNTER — Other Ambulatory Visit: Payer: Self-pay | Admitting: Cardiovascular Disease

## 2022-09-11 DIAGNOSIS — M9903 Segmental and somatic dysfunction of lumbar region: Secondary | ICD-10-CM | POA: Diagnosis not present

## 2022-09-11 DIAGNOSIS — M25552 Pain in left hip: Secondary | ICD-10-CM | POA: Diagnosis not present

## 2022-09-11 DIAGNOSIS — M62830 Muscle spasm of back: Secondary | ICD-10-CM | POA: Diagnosis not present

## 2022-09-11 DIAGNOSIS — M9906 Segmental and somatic dysfunction of lower extremity: Secondary | ICD-10-CM | POA: Diagnosis not present

## 2022-09-11 DIAGNOSIS — M9904 Segmental and somatic dysfunction of sacral region: Secondary | ICD-10-CM | POA: Diagnosis not present

## 2022-09-11 DIAGNOSIS — M9905 Segmental and somatic dysfunction of pelvic region: Secondary | ICD-10-CM | POA: Diagnosis not present

## 2022-09-11 DIAGNOSIS — M25569 Pain in unspecified knee: Secondary | ICD-10-CM | POA: Diagnosis not present

## 2022-09-11 DIAGNOSIS — M9901 Segmental and somatic dysfunction of cervical region: Secondary | ICD-10-CM | POA: Diagnosis not present

## 2022-09-11 DIAGNOSIS — M62838 Other muscle spasm: Secondary | ICD-10-CM | POA: Diagnosis not present

## 2022-09-11 DIAGNOSIS — M542 Cervicalgia: Secondary | ICD-10-CM | POA: Diagnosis not present

## 2022-09-11 DIAGNOSIS — M461 Sacroiliitis, not elsewhere classified: Secondary | ICD-10-CM | POA: Diagnosis not present

## 2022-09-15 DIAGNOSIS — M62830 Muscle spasm of back: Secondary | ICD-10-CM | POA: Diagnosis not present

## 2022-09-15 DIAGNOSIS — M9903 Segmental and somatic dysfunction of lumbar region: Secondary | ICD-10-CM | POA: Diagnosis not present

## 2022-09-15 DIAGNOSIS — M9906 Segmental and somatic dysfunction of lower extremity: Secondary | ICD-10-CM | POA: Diagnosis not present

## 2022-09-15 DIAGNOSIS — M9905 Segmental and somatic dysfunction of pelvic region: Secondary | ICD-10-CM | POA: Diagnosis not present

## 2022-09-15 DIAGNOSIS — M9904 Segmental and somatic dysfunction of sacral region: Secondary | ICD-10-CM | POA: Diagnosis not present

## 2022-09-15 DIAGNOSIS — M25552 Pain in left hip: Secondary | ICD-10-CM | POA: Diagnosis not present

## 2022-09-15 DIAGNOSIS — M461 Sacroiliitis, not elsewhere classified: Secondary | ICD-10-CM | POA: Diagnosis not present

## 2022-09-15 DIAGNOSIS — M62838 Other muscle spasm: Secondary | ICD-10-CM | POA: Diagnosis not present

## 2022-09-15 DIAGNOSIS — M542 Cervicalgia: Secondary | ICD-10-CM | POA: Diagnosis not present

## 2022-09-15 DIAGNOSIS — M25569 Pain in unspecified knee: Secondary | ICD-10-CM | POA: Diagnosis not present

## 2022-09-15 DIAGNOSIS — M9901 Segmental and somatic dysfunction of cervical region: Secondary | ICD-10-CM | POA: Diagnosis not present

## 2022-09-29 ENCOUNTER — Telehealth: Payer: Self-pay

## 2022-09-29 NOTE — Telephone Encounter (Signed)
Patient called and wanted to know if she is able to receive cortisone knee injections since she has the diagnosis of scleroderma? Patient states she has received them in the past as well as gel injections. I advised patient she has not been seen in our office since December of 2021 and would need to be seen in the office. Patient states she does not recall if Dr. Corliss Skains advised her she should not have cortisone injections or take oral prednisone based on her diagnosis.  Patient declined to make an appointment, stating that her son in law is a physician at [facility] and would like her to see a physician there for possible knee injections.

## 2022-09-29 NOTE — Telephone Encounter (Signed)
We recommend viscosupplement injections over cortisone injections.  She can see an orthopedic surgeon or a rheumatologist locally for the injections.  She has not seen [place] or a pulmonologist in many years.  She should establish with a pulmonologist and a rheumatologist locally.

## 2022-09-30 ENCOUNTER — Encounter: Payer: Self-pay | Admitting: *Deleted

## 2022-09-30 NOTE — Telephone Encounter (Signed)
Patient advised we recommend viscosupplement injections over cortisone injections.  She can see an orthopedic surgeon or a rheumatologist locally for the injections.  She has not seen [place] or a pulmonologist in many years.  She should establish with a pulmonologist and a rheumatologist locally. Patient expressed understanding.

## 2022-10-02 DIAGNOSIS — M7062 Trochanteric bursitis, left hip: Secondary | ICD-10-CM | POA: Diagnosis not present

## 2022-10-02 DIAGNOSIS — M25552 Pain in left hip: Secondary | ICD-10-CM | POA: Diagnosis not present

## 2022-10-02 DIAGNOSIS — M1711 Unilateral primary osteoarthritis, right knee: Secondary | ICD-10-CM | POA: Diagnosis not present

## 2022-10-04 ENCOUNTER — Other Ambulatory Visit: Payer: Self-pay | Admitting: Cardiovascular Disease

## 2022-10-04 DIAGNOSIS — I2510 Atherosclerotic heart disease of native coronary artery without angina pectoris: Secondary | ICD-10-CM

## 2022-10-04 DIAGNOSIS — Z8673 Personal history of transient ischemic attack (TIA), and cerebral infarction without residual deficits: Secondary | ICD-10-CM

## 2022-10-04 DIAGNOSIS — M349 Systemic sclerosis, unspecified: Secondary | ICD-10-CM

## 2022-10-04 DIAGNOSIS — E782 Mixed hyperlipidemia: Secondary | ICD-10-CM

## 2022-10-04 DIAGNOSIS — I10 Essential (primary) hypertension: Secondary | ICD-10-CM

## 2022-10-06 MED ORDER — ICOSAPENT ETHYL 1 G PO CAPS: 1.0000 g | ORAL_CAPSULE | ORAL | 2 refills | Status: DC

## 2022-10-16 DIAGNOSIS — Z23 Encounter for immunization: Secondary | ICD-10-CM | POA: Diagnosis not present

## 2022-10-28 DIAGNOSIS — M542 Cervicalgia: Secondary | ICD-10-CM | POA: Diagnosis not present

## 2022-10-28 DIAGNOSIS — M9903 Segmental and somatic dysfunction of lumbar region: Secondary | ICD-10-CM | POA: Diagnosis not present

## 2022-10-28 DIAGNOSIS — M9906 Segmental and somatic dysfunction of lower extremity: Secondary | ICD-10-CM | POA: Diagnosis not present

## 2022-10-28 DIAGNOSIS — M9901 Segmental and somatic dysfunction of cervical region: Secondary | ICD-10-CM | POA: Diagnosis not present

## 2022-10-28 DIAGNOSIS — M62830 Muscle spasm of back: Secondary | ICD-10-CM | POA: Diagnosis not present

## 2022-10-28 DIAGNOSIS — M25569 Pain in unspecified knee: Secondary | ICD-10-CM | POA: Diagnosis not present

## 2022-10-28 DIAGNOSIS — M62838 Other muscle spasm: Secondary | ICD-10-CM | POA: Diagnosis not present

## 2022-10-28 DIAGNOSIS — M25552 Pain in left hip: Secondary | ICD-10-CM | POA: Diagnosis not present

## 2022-10-28 DIAGNOSIS — M9904 Segmental and somatic dysfunction of sacral region: Secondary | ICD-10-CM | POA: Diagnosis not present

## 2022-10-28 DIAGNOSIS — M461 Sacroiliitis, not elsewhere classified: Secondary | ICD-10-CM | POA: Diagnosis not present

## 2022-10-28 DIAGNOSIS — M9905 Segmental and somatic dysfunction of pelvic region: Secondary | ICD-10-CM | POA: Diagnosis not present

## 2022-10-29 DIAGNOSIS — M9906 Segmental and somatic dysfunction of lower extremity: Secondary | ICD-10-CM | POA: Diagnosis not present

## 2022-10-29 DIAGNOSIS — M62838 Other muscle spasm: Secondary | ICD-10-CM | POA: Diagnosis not present

## 2022-10-29 DIAGNOSIS — M9901 Segmental and somatic dysfunction of cervical region: Secondary | ICD-10-CM | POA: Diagnosis not present

## 2022-10-29 DIAGNOSIS — M461 Sacroiliitis, not elsewhere classified: Secondary | ICD-10-CM | POA: Diagnosis not present

## 2022-10-29 DIAGNOSIS — M25552 Pain in left hip: Secondary | ICD-10-CM | POA: Diagnosis not present

## 2022-10-29 DIAGNOSIS — M9904 Segmental and somatic dysfunction of sacral region: Secondary | ICD-10-CM | POA: Diagnosis not present

## 2022-10-29 DIAGNOSIS — M9903 Segmental and somatic dysfunction of lumbar region: Secondary | ICD-10-CM | POA: Diagnosis not present

## 2022-10-29 DIAGNOSIS — M542 Cervicalgia: Secondary | ICD-10-CM | POA: Diagnosis not present

## 2022-10-29 DIAGNOSIS — M9905 Segmental and somatic dysfunction of pelvic region: Secondary | ICD-10-CM | POA: Diagnosis not present

## 2022-10-29 DIAGNOSIS — M62830 Muscle spasm of back: Secondary | ICD-10-CM | POA: Diagnosis not present

## 2022-10-29 DIAGNOSIS — M25569 Pain in unspecified knee: Secondary | ICD-10-CM | POA: Diagnosis not present

## 2022-11-24 ENCOUNTER — Encounter: Payer: Self-pay | Admitting: Internal Medicine

## 2022-12-02 ENCOUNTER — Ambulatory Visit: Payer: Medicare Other | Admitting: Internal Medicine

## 2022-12-02 ENCOUNTER — Encounter: Payer: Self-pay | Admitting: Internal Medicine

## 2022-12-02 ENCOUNTER — Telehealth: Payer: Self-pay | Admitting: Internal Medicine

## 2022-12-02 VITALS — BP 134/76 | HR 81 | Temp 98.8°F | Ht 66.0 in | Wt 182.0 lb

## 2022-12-02 DIAGNOSIS — N1831 Chronic kidney disease, stage 3a: Secondary | ICD-10-CM | POA: Diagnosis not present

## 2022-12-02 DIAGNOSIS — A6009 Herpesviral infection of other urogenital tract: Secondary | ICD-10-CM

## 2022-12-02 DIAGNOSIS — I10 Essential (primary) hypertension: Secondary | ICD-10-CM

## 2022-12-02 DIAGNOSIS — M7989 Other specified soft tissue disorders: Secondary | ICD-10-CM | POA: Insufficient documentation

## 2022-12-02 DIAGNOSIS — M79662 Pain in left lower leg: Secondary | ICD-10-CM | POA: Diagnosis not present

## 2022-12-02 DIAGNOSIS — E785 Hyperlipidemia, unspecified: Secondary | ICD-10-CM | POA: Diagnosis not present

## 2022-12-02 LAB — URINALYSIS, ROUTINE W REFLEX MICROSCOPIC
Bilirubin Urine: NEGATIVE
Hgb urine dipstick: NEGATIVE
Ketones, ur: NEGATIVE
Leukocytes,Ua: NEGATIVE
Nitrite: NEGATIVE
RBC / HPF: NONE SEEN (ref 0–?)
Specific Gravity, Urine: >=1.03 — AB (ref 1.000–1.030)
Total Protein, Urine: NEGATIVE
Urine Glucose: NEGATIVE
Urobilinogen, UA: 0.2 (ref 0.0–1.0)
pH: 6 (ref 5.0–8.0)

## 2022-12-02 LAB — D-DIMER, QUANTITATIVE: D-Dimer, Quant: 0.8 ug{FEU}/mL — ABNORMAL HIGH (ref <0.50)

## 2022-12-02 LAB — BASIC METABOLIC PANEL
BUN: 18 mg/dL (ref 6–23)
CO2: 29 meq/L (ref 19–32)
Calcium: 10.5 mg/dL (ref 8.4–10.5)
Chloride: 106 meq/L (ref 96–112)
Creatinine, Ser: 0.78 mg/dL (ref 0.40–1.20)
GFR: 72.85 mL/min (ref >60.00)
Glucose, Bld: 82 mg/dL (ref 70–99)
Potassium: 4.2 meq/L (ref 3.5–5.1)
Sodium: 141 meq/L (ref 135–145)

## 2022-12-02 LAB — TSH: TSH: 3.43 u[IU]/mL (ref 0.35–5.50)

## 2022-12-02 NOTE — Patient Instructions (Signed)
Chronic Kidney Disease, Adult Chronic kidney disease (CKD) occurs when the kidneys are slowly and permanently damaged over a long period of time. The kidneys are a pair of organs that do many important jobs in the body, including: Removing waste and extra fluid from the blood to make urine. Making hormones that maintain the amount of fluid in tissues and blood vessels. Maintaining the right amount of fluids and chemicals in the body. A small amount of kidney damage may not cause problems, but a large amount of damage may make it hard or impossible for the kidneys to work right. Steps must be taken to slow kidney damage or to stop it from getting worse. If steps are not taken, the kidneys may stop working permanently (end-stage renal disease, or ESRD). Most of the time, CKD does not go away, but it can often be controlled. People who have CKD are usually able to live full lives. What are the causes? The most common causes of this condition are diabetes and high blood pressure (hypertension). Other causes include: Cardiovascular diseases. These affect the heart and blood vessels. Kidney diseases. These include: Glomerulonephritis, or inflammation of the tiny filters in the kidneys. Interstitial nephritis. This is swelling of the small tubes of the kidneys and of the surrounding structures. Polycystic kidney disease, in which clusters of fluid-filled sacs form within the kidneys. Renal vascular disease. This includes disorders that affect the arteries and veins of the kidneys. Diseases that affect the body's defense system (immune system). A problem with urine flow. This may be caused by: Kidney stones. Cancer. An enlarged prostate, in males. A kidney infection or urinary tract infection (UTI) that keeps coming back. Vasculitis. This is swelling or inflammation of the blood vessels. What increases the risk? Your chances of having kidney disease increase with age. The following factors may make  you more likely to develop this condition: A family history of kidney disease or kidney failure. Kidney failure means the kidneys can no longer work right. Certain genetic diseases. Taking medicines often that are damaging to the kidneys. Being around or being in contact with toxic substances. Obesity. A history of tobacco use. What are the signs or symptoms? Symptoms of this condition include: Feeling very tired (lethargic) and having less energy. Swelling, or edema, of the face, legs, ankles, or feet. Nausea or vomiting, or loss of appetite. Confusion or trouble concentrating. Muscle twitches and cramps, especially in the legs. Dry, itchy skin. A metallic taste in the mouth. Producing less urine, or producing more urine (especially at night). Shortness of breath. Trouble sleeping. CKD may also result in not having enough red blood cells or hemoglobin in the blood (anemia) or having weak bones (bone disease). Symptoms develop slowly and may not be obvious until the kidney damage becomes severe. It is possible to have kidney disease for years without having symptoms. How is this diagnosed? This condition may be diagnosed based on: Blood tests. Urine tests. Imaging tests, such as an ultrasound or a CT scan. A kidney biopsy. This involves removing a sample of kidney tissue to be looked at under a microscope. Results from these tests will help to determine how serious the CKD is. How is this treated? There is no cure for most cases of this condition, but treatment usually relieves symptoms and prevents or slows the worsening of the disease. Treatment may include: Diet changes, which may require you to avoid alcohol and foods that are high in salt, potassium, phosphorous, and protein. Medicines. These may:  Lower blood pressure. Control blood sugar (glucose). Relieve anemia. Relieve swelling. Protect your bones. Improve the balance of salts and minerals in your blood  (electrolytes). Dialysis, which is a type of treatment that removes toxic waste from the body. It may be needed if you have kidney failure. Managing any other conditions that are causing your CKD or making it worse. Follow these instructions at home: Medicines Take over-the-counter and prescription medicines only as told by your health care provider. The amount of some medicines that you take may need to be changed. Do not take any new medicines unless approved by your health care provider. Many medicines can make kidney damage worse. Do not take any vitamin and mineral supplements unless approved by your health care provider. Many nutritional supplements can make kidney damage worse. Lifestyle  Do not use any products that contain nicotine or tobacco, such as cigarettes, e-cigarettes, and chewing tobacco. If you need help quitting, ask your health care provider. If you drink alcohol: Limit how much you use to: 0-1 drink a day for women who are not pregnant. 0-2 drinks a day for men. Know how much alcohol is in your drink. In the [place] one drink equals one 12 oz bottle of beer (355 mL), one 5 oz glass of wine (148 mL), or one 1 oz glass of hard liquor (44 mL). Maintain a healthy weight. If you need help, ask your health care provider. General instructions  Follow instructions from your health care provider about eating or drinking restrictions, including any prescribed diet. Track your blood pressure at home. Report changes in your blood pressure as told. If you are being treated for diabetes, track your blood glucose levels as told. Start or continue an exercise plan. Exercise at least 30 minutes a day, 5 days a week. Keep your immunizations up to date as told. Keep all follow-up visits. This is important. Where to find more information American Association of Kidney Patients: [employer].is [employer]: www.kidney.org American Kidney Fund: FightingMatch.com.ee Life Options:  www.lifeoptions.org Kidney School: www.kidneyschool.org Contact a health care provider if: Your symptoms get worse. You develop new symptoms. Get help right away if: You develop symptoms of ESRD. These include: Headaches. Numbness in your hands or feet. Easy bruising. Frequent hiccups. Chest pain. Shortness of breath. Lack of menstrual periods, in women. You have a fever. You are producing less urine than usual. You have pain or bleeding when you urinate or when you have a bowel movement. These symptoms may represent a serious problem that is an emergency. Do not wait to see if the symptoms will go away. Get medical help right away. Call your local emergency services (911 in the [place] Do not drive yourself to the hospital. Summary Chronic kidney disease (CKD) occurs when the kidneys become damaged slowly over a long period of time. The most common causes of this condition are diabetes and high blood pressure (hypertension). There is no cure for most cases of CKD, but treatment usually relieves symptoms and prevents or slows the worsening of the disease. Treatment may include a combination of lifestyle changes, medicines, and dialysis. This information is not intended to replace advice given to you by your health care provider. Make sure you discuss any questions you have with your health care provider. Document Revised: 05/06/2019 Document Reviewed: 05/11/2019 Elsevier Patient Education  2024 [employer].

## 2022-12-02 NOTE — Telephone Encounter (Signed)
Patient was seen by PCP today and was told they would send a refill of valACYclovir (VALTREX) 1000 MG tablet. She would like to know if that will be sent in today. Best callback is [phone].

## 2022-12-02 NOTE — Progress Notes (Signed)
Subjective:  Patient ID: Savannah [name], female    DOB: December 08, 1945  Age: 77 y.o. MRN: [id number]  CC: Hypertension   HPI Savannah [name] presents for f/up ---  Discussed the use of AI scribe software for clinical note transcription with the patient, who gave verbal consent to proceed.  History of Present Illness   The patient, with a history of hypertension, arthritis, and bursitis, presents with concerns about recent lab results indicating stage 3 chronic kidney disease (CKD). She has been taking Aleve, Advil, and previously prednisone, which may have contributed to kidney damage. She also reports occasional facial flushing, which she associates with high blood pressure readings, despite being on metoprolol and Olmesartan. She also takes furosemide as needed for swelling and when her blood pressure is high.  The patient has a history of breast cancer, now in remission, and has been off hormone therapy for two years. She continues to have regular mammograms and bone density tests, the last of which were approximately six months ago.  She has been receiving cortisone shots for knee pain and bursitis in the hip. She noticed that her blood pressure tends to be high for about a week after receiving these shots. She also reports a several week history of pain/swelling in her LLE. She denies any recent falls or injuries to the hip. The pain is particularly noticeable when getting into the car, requiring her to lift her leg, which exacerbates the discomfort. She has not seen a bulge in the groin but feels like there a SQ mass in the left medical thigh. Her left hip was last x-rayed when she received her most recent injection.       Outpatient Medications Prior to Visit  Medication Sig Dispense Refill   anastrozole (ARIMIDEX) 1 MG tablet Take 1 mg by mouth every morning.     aspirin 81 MG tablet Take 81 mg by mouth.     b complex vitamins capsule Take 1 capsule by mouth 2 (two) times a week.     Calcium  Carbonate (CALCIUM 600 PO) Take by mouth 3 (three) times a week.     Cholecalciferol (D3 MAXIMUM STRENGTH PO) Take 5,000 Units by mouth 2 (two) times a week.      Coenzyme Q10 200 MG capsule Take 200 mg by mouth daily.     diclofenac (VOLTAREN) 0.1 % ophthalmic solution Place 1 drop into the left eye 2 (two) times daily.     ezetimibe (ZETIA) 10 MG tablet Take 10 mg by mouth 2 (two) times a week.     furosemide (LASIX) 20 MG tablet TAKE 20 MG( 1/2 TABLET ) TO 40 MG ( 1 TABLET) AS NEEDED FOR FLUID OR EDEMA 180 tablet 1   icosapent Ethyl (VASCEPA) 1 g capsule Take 1 capsule (1 g total) by mouth 2 (two) times a week. 24 capsule 2   loratadine (CLARITIN) 10 MG tablet Take 10 mg by mouth daily as needed for allergies.     Magnesium 300 MG CAPS Take 300 mg by mouth 3 (three) times a week.     MAGNESIUM CL-CALCIUM CARBONATE PO Take 1,000 mg by mouth daily.     Menaquinone-7 (VITAMIN K2 PO) Take 50 mcg by mouth 2 (two) times a week.      metoprolol succinate (TOPROL-XL) 50 MG 24 hr tablet TAKE 1 TABLET BY MOUTH EVERY DAY. TAKE WITH OR IMMEDIATELY FOLLOWING A MEAL. 90 tablet 3   olmesartan (BENICAR) 40 MG tablet Take 1 tablet (  40 mg total) by mouth daily. 90 tablet 3   Pitavastatin Calcium 2 MG TABS TAKE 1 TABLET BY MOUTH TWICE WEEKLY 26 tablet 13   potassium chloride SA (KLOR-CON M20) 20 MEQ tablet Take 1 tablet (20 mEq total) by mouth daily as needed. When you take Furosemide (Lasix). 90 tablet 3   valACYclovir (VALTREX) 1000 MG tablet TAKE 1 TABLET BY MOUTH TWICE A DAY 180 tablet 1   vitamin E 180 MG (400 UNITS) capsule Take 400 Units by mouth daily.     No facility-administered medications prior to visit.    ROS Review of Systems  Constitutional: Negative.  Negative for appetite change, chills, diaphoresis, fatigue and fever.  HENT: Negative.    Eyes: Negative.  Negative for visual disturbance.  Respiratory:  Negative for cough, chest tightness, shortness of breath and wheezing.    Cardiovascular:  Positive for leg swelling. Negative for chest pain and palpitations.  Gastrointestinal:  Negative for abdominal pain, constipation, diarrhea, nausea and vomiting.  Genitourinary: Negative.  Negative for difficulty urinating.  Musculoskeletal:  Positive for arthralgias. Negative for back pain, gait problem and joint swelling.  Skin:  Negative for color change, pallor and rash.  Neurological: Negative.  Negative for facial asymmetry, weakness and numbness.  Hematological:  Negative for adenopathy. Does not bruise/bleed easily.  Psychiatric/Behavioral:  Positive for confusion and decreased concentration.     Objective:  BP 134/76 (BP Location: Left Arm, Patient Position: Sitting, Cuff Size: Large)   Pulse 81   Temp 98.8 F (37.1 C) (Oral)   Ht 5\' 6"  (1.676 m)   Wt 182 lb (82.6 kg)   SpO2 94%   BMI 29.38 kg/m   BP Readings from Last 3 Encounters:  12/02/22 134/76  06/18/22 132/70  11/27/21 114/60    Wt Readings from Last 3 Encounters:  12/02/22 182 lb (82.6 kg)  06/18/22 182 lb (82.6 kg)  11/27/21 176 lb 6.4 oz (80 kg)    Physical Exam Vitals reviewed.  Constitutional:      Appearance: Normal appearance.  HENT:     Mouth/Throat:     Mouth: Mucous membranes are moist.  Eyes:     General: No scleral icterus.    Conjunctiva/sclera: Conjunctivae normal.  Cardiovascular:     Rate and Rhythm: Normal rate and regular rhythm.     Heart sounds: No murmur heard. Pulmonary:     Effort: Pulmonary effort is normal.     Breath sounds: No stridor. No wheezing, rhonchi or rales.  Abdominal:     General: Abdomen is protuberant. Bowel sounds are normal. There is no distension.     Palpations: Abdomen is soft. There is no hepatomegaly, splenomegaly or mass.     Tenderness: There is no abdominal tenderness. There is no guarding or rebound.     Hernia: No hernia is present. There is no hernia in the umbilical area, ventral area, left inguinal area, right femoral area,  left femoral area or right inguinal area.  Musculoskeletal:        General: No swelling.     Cervical back: Neck supple.     Right lower leg: No swelling, deformity, lacerations, tenderness or bony tenderness. 1+ Pitting Edema present.     Left lower leg: No swelling, deformity, lacerations, tenderness or bony tenderness. 1+ Pitting Edema present.  Lymphadenopathy:     Cervical: No cervical adenopathy.  Skin:    General: Skin is warm and dry.     Findings: No lesion or rash.  Neurological:  General: No focal deficit present.     Mental Status: She is alert. Mental status is at baseline.  Psychiatric:        Mood and Affect: Mood normal.        Behavior: Behavior normal.     Lab Results  Component Value Date   WBC 6.4 07/30/2022   HGB 14.9 07/30/2022   HCT 42.6 07/30/2022   PLT 274 07/30/2022   GLUCOSE 82 12/02/2022   CHOL 231 (H) 07/30/2022   TRIG 193 (H) 07/30/2022   HDL 57 07/30/2022   LDLDIRECT 73.0 01/16/2021   LDLCALC 140 (H) 07/30/2022   ALT 14 07/30/2022   AST 13 07/30/2022   NA 141 12/02/2022   K 4.2 12/02/2022   CL 106 12/02/2022   CREATININE 0.78 12/02/2022   BUN 18 12/02/2022   CO2 29 12/02/2022   TSH 3.43 12/02/2022   HGBA1C 5.2 11/27/2021    CT Chest High Resolution  Result Date: 02/06/2021 CLINICAL DATA:  Interstitial lung disease EXAM: CT CHEST WITHOUT CONTRAST TECHNIQUE: Multidetector CT imaging of the chest was performed following the standard protocol without intravenous contrast. High resolution imaging of the lungs, as well as inspiratory and expiratory imaging, was performed. COMPARISON:  CT chest dated February 09, 2020 FINDINGS: Cardiovascular: Normal heart size. No pericardial effusion. Three-vessel coronary artery calcifications. Atherosclerotic disease of the thoracic aorta. Mediastinum/Nodes: No pathologically enlarged lymph nodes seen in the chest. Esophagus is unremarkable. Lungs/Pleura: Central airways are patent. Mild bilateral air  trapping. Mild focal bronchiectasis the right lower lobe, likely sequela of prior infection. Minimal reticular opacities are again seen at the lung bases and unchanged compared to prior exam. No traction bronchiectasis or honeycomb change. Stable solid pulmonary nodule of the right middle lobe measuring 3 mm on series 6, image 87, favored to be benign given 1 year stability. Upper Abdomen: No acute abnormality. Musculoskeletal: No chest wall mass or suspicious bone lesions identified. IMPRESSION: 1. Minimal basilar reticular opacities with no evidence of progression. Findings are indeterminate for UIP per consensus guidelines: Diagnosis of Idiopathic Pulmonary Fibrosis: An Official ATS/ERS/JRS/ALAT Clinical Practice Guideline. Am Rosezetta Schlatter Crit Care Med Vol 198, Iss 5, [phone], Oct 18 2016. 2. Bilateral air trapping, findings can be seen in the setting of small airways disease. 3. Coronary artery calcifications. 4. Aortic Atherosclerosis (ICD10-I70.0). Electronically Signed   By: Allegra Lai M.D.   On: 02/06/2021 09:22    Assessment & Plan:   Stage 3a chronic kidney disease (HCC) - She will avoid nsaids. -     Urinalysis, Routine w reflex microscopic; Future -     Basic metabolic panel; Future  Primary hypertension- Her BP is well controlled. -     TSH; Future -     Urinalysis, Routine w reflex microscopic; Future  Dyslipidemia, goal LDL below 160 -     TSH; Future  Pain and swelling of left lower leg- Will evaluate for DVT. -     D-dimer, quantitative; Future -     VAS [place] LOWER EXTREMITY VENOUS (DVT); Future     Follow-up: Return in about 3 months (around 03/04/2023).  Sanda Linger, MD

## 2022-12-03 MED ORDER — VALACYCLOVIR HCL 1 G PO TABS: 1000.0000 mg | ORAL_TABLET | Freq: Two times a day (BID) | ORAL | 1 refills | Status: AC

## 2022-12-08 DIAGNOSIS — Z170 Estrogen receptor positive status [ER+]: Secondary | ICD-10-CM | POA: Diagnosis not present

## 2022-12-08 DIAGNOSIS — C50211 Malignant neoplasm of upper-inner quadrant of right female breast: Secondary | ICD-10-CM | POA: Diagnosis not present

## 2022-12-12 ENCOUNTER — Ambulatory Visit (HOSPITAL_BASED_OUTPATIENT_CLINIC_OR_DEPARTMENT_OTHER): Payer: Medicare Other

## 2022-12-12 ENCOUNTER — Telehealth: Payer: Self-pay

## 2022-12-12 DIAGNOSIS — M7989 Other specified soft tissue disorders: Secondary | ICD-10-CM

## 2022-12-12 DIAGNOSIS — M79662 Pain in left lower leg: Secondary | ICD-10-CM | POA: Diagnosis not present

## 2022-12-12 NOTE — Telephone Encounter (Signed)
Result from [facility]  Left Vein, negative for DVT and Bake's baker's cyst  RECEIVER (on-site recipient of call): Delorise Shiner  DATE & TIME NOTIFIED: 3:45 pm  MESSENGER (representative from lab): Alisha from [facility]  MD NOTIFIED: mychart  TIME OF NOTIFICATION:

## 2022-12-15 ENCOUNTER — Encounter: Payer: Self-pay | Admitting: Internal Medicine

## 2022-12-18 DIAGNOSIS — M1712 Unilateral primary osteoarthritis, left knee: Secondary | ICD-10-CM | POA: Diagnosis not present

## 2022-12-18 DIAGNOSIS — M1711 Unilateral primary osteoarthritis, right knee: Secondary | ICD-10-CM | POA: Diagnosis not present

## 2022-12-18 DIAGNOSIS — M25552 Pain in left hip: Secondary | ICD-10-CM | POA: Diagnosis not present

## 2022-12-28 DIAGNOSIS — R1032 Left lower quadrant pain: Secondary | ICD-10-CM | POA: Diagnosis not present

## 2022-12-28 DIAGNOSIS — R9431 Abnormal electrocardiogram [ECG] [EKG]: Secondary | ICD-10-CM | POA: Diagnosis not present

## 2022-12-28 DIAGNOSIS — K5730 Diverticulosis of large intestine without perforation or abscess without bleeding: Secondary | ICD-10-CM | POA: Diagnosis not present

## 2022-12-28 DIAGNOSIS — M79605 Pain in left leg: Secondary | ICD-10-CM | POA: Diagnosis not present

## 2022-12-28 DIAGNOSIS — M25552 Pain in left hip: Secondary | ICD-10-CM | POA: Diagnosis not present

## 2022-12-28 DIAGNOSIS — R109 Unspecified abdominal pain: Secondary | ICD-10-CM | POA: Diagnosis not present

## 2022-12-28 DIAGNOSIS — R1900 Intra-abdominal and pelvic swelling, mass and lump, unspecified site: Secondary | ICD-10-CM | POA: Diagnosis not present

## 2022-12-28 DIAGNOSIS — R600 Localized edema: Secondary | ICD-10-CM | POA: Diagnosis not present

## 2022-12-28 DIAGNOSIS — I509 Heart failure, unspecified: Secondary | ICD-10-CM | POA: Diagnosis not present

## 2023-01-07 DIAGNOSIS — S73192A Other sprain of left hip, initial encounter: Secondary | ICD-10-CM | POA: Diagnosis not present

## 2023-01-07 DIAGNOSIS — M25552 Pain in left hip: Secondary | ICD-10-CM | POA: Diagnosis not present

## 2023-01-07 DIAGNOSIS — M8788 Other osteonecrosis, other site: Secondary | ICD-10-CM | POA: Diagnosis not present

## 2023-01-13 DIAGNOSIS — M1612 Unilateral primary osteoarthritis, left hip: Secondary | ICD-10-CM | POA: Insufficient documentation

## 2023-01-13 DIAGNOSIS — H903 Sensorineural hearing loss, bilateral: Secondary | ICD-10-CM | POA: Diagnosis not present

## 2023-01-27 ENCOUNTER — Other Ambulatory Visit (HOSPITAL_BASED_OUTPATIENT_CLINIC_OR_DEPARTMENT_OTHER): Payer: Self-pay

## 2023-01-27 ENCOUNTER — Ambulatory Visit (HOSPITAL_BASED_OUTPATIENT_CLINIC_OR_DEPARTMENT_OTHER): Payer: Medicare Other | Admitting: Internal Medicine

## 2023-01-27 DIAGNOSIS — J849 Interstitial pulmonary disease, unspecified: Secondary | ICD-10-CM | POA: Diagnosis not present

## 2023-01-27 LAB — PULMONARY FUNCTION TEST
DL/VA % pred: 88 %
DL/VA: 3.59 ml/min/mmHg/L
DLCO cor % pred: 78 %
DLCO cor: 15.62 ml/min/mmHg
DLCO unc % pred: 78 %
DLCO unc: 15.62 ml/min/mmHg
FEF 25-75 Pre: 1.75 L/s
FEF2575-%Pred-Pre: 109 %
FEV1-%Pred-Pre: 104 %
FEV1-Pre: 2.25 L
FEV1FVC-%Pred-Pre: 103 %
FEV6-%Pred-Pre: 106 %
FEV6-Pre: 2.92 L
FEV6FVC-%Pred-Pre: 105 %
FVC-%Pred-Pre: 101 %
FVC-Pre: 2.92 L
Pre FEV1/FVC ratio: 77 %
Pre FEV6/FVC Ratio: 100 %

## 2023-01-27 NOTE — Progress Notes (Signed)
Spirometry and DLCO Performed Today.  

## 2023-01-27 NOTE — Patient Instructions (Signed)
Spirometry and DLCO Performed Today.  

## 2023-02-02 ENCOUNTER — Encounter: Payer: Self-pay | Admitting: Cardiovascular Disease

## 2023-02-02 ENCOUNTER — Ambulatory Visit: Payer: Medicare Other | Attending: Cardiovascular Disease | Admitting: Cardiovascular Disease

## 2023-02-02 DIAGNOSIS — E782 Mixed hyperlipidemia: Secondary | ICD-10-CM | POA: Insufficient documentation

## 2023-02-02 DIAGNOSIS — I5189 Other ill-defined heart diseases: Secondary | ICD-10-CM | POA: Diagnosis not present

## 2023-02-02 DIAGNOSIS — Z01818 Encounter for other preprocedural examination: Secondary | ICD-10-CM | POA: Insufficient documentation

## 2023-02-02 DIAGNOSIS — M349 Systemic sclerosis, unspecified: Secondary | ICD-10-CM | POA: Insufficient documentation

## 2023-02-02 DIAGNOSIS — J849 Interstitial pulmonary disease, unspecified: Secondary | ICD-10-CM | POA: Insufficient documentation

## 2023-02-02 DIAGNOSIS — I10 Essential (primary) hypertension: Secondary | ICD-10-CM | POA: Insufficient documentation

## 2023-02-02 MED ORDER — OLMESARTAN MEDOXOMIL-HCTZ 40-12.5 MG PO TABS: 1.0000 | ORAL_TABLET | Freq: Every day | ORAL | 3 refills | Status: DC

## 2023-02-02 NOTE — Progress Notes (Signed)
Cardiology Office Note    Date:  02/02/2023   ID:  Savannah [name], DOB [birth year], MRN [id number]  PCP:  Etta Grandchild, MD  Cardiologist:  Nicki Guadalajara, MD   Please fax this report to Dr. Gonzella Lex, MD at [facility] [phone] or [phone] or Dewayne Hatch [phone]  7 month F/U cardiology evaluation and cardiology clearance  History of Present Illness:  Savannah [name] is a 77 y.o. female who presents for a 7 month f/u cardiology evaluation.  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  Savannah [name] has biopsy-proven scleroderma biopsy on Savannah [name] hand around 2005.  Savannah [name] has had issues with shortness of breath in the past.  In 2008, Savannah [name] ejection fraction was 45 to 55% by echocardiography and a Myoview study November 2008 was negative for ischemia with hyperdynamic post stress ejection fraction.  Savannah [name] has been seen by Dr. Gala Romney.  On CT imaging Savannah [name] was found to have mild interstitial lung disease for which Savannah [name] subsequently was referred to Dr. Marchelle Gearing.  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  On that CT Savannah [name] was also noted to have aortic atherosclerosis in addition to left main and three-vessel coronary artery disease.  However this was not gated and assessment of obstructive stenoses could not be made.  On December 23, 2017 Savannah [name] underwent a follow-up echo Doppler study which showed an EF of 55 to 60%.  LV strain was normal at -17.4%.  Savannah [name] did not have regional wall motion abnormalities.  Savannah [name] had mild calcification of Savannah [name] aortic leaflets.  There is trivial MR and PR.  PA pressures were not increased.  Savannah [name] last saw Dr. Gala Romney in November 2019 and presents now for cardiology care and risk management.  Savannah [name] has a history of significant mixed hyperlipidemia  and is on Vascepa and Livalo, apparently has only been taking Livalo maybe once or at most twice per week.  Admits to rare to occasional palpitations.  Savannah [name] denies recent chest tightness.  Savannah [name] denies presyncope or syncope.  I have cared for Savannah [name] husband.  Savannah [name] has a history of snoring but states Savannah [name] sleep is restorative.  Savannah [name] typically goes to [place] from mid January through June.  When I saw Savannah [name] in December 2019 I recommended that Savannah [name] undergo coronary CT angiography to further elucidate Savannah [name] coronary anatomy after Savannah [name] was found to have plaque on Savannah [name] chest CT.  This was performed on March 01, 2018.  Savannah [name] calcium score was 20 which was only 44 percentile for age and sex.  There was mild plaque noted with less than 30% stenosis in the proximal and mid LAD, circumflex and right coronary arteries.  Savannah [name] had a normal aortic root at 3.3 cm.  I also recommended Vascepa in addition to Livalo for Savannah [name] mixed hyperlipidemia and added Zetia for an additional 20% lowering.  Savannah [name] went to [place] from January until October and returned last week.  Apparently Savannah [name] has only been taking Zetia 2 times per week and is only been taking Vascepa 2 times per week.  Savannah [name] has not had relaboratory.  Savannah [name] is no longer taking Livalo and apparently stopped taking it.  Savannah [name] denied chest pain PND orthopnea.  Savannah [name] will again begin to [place] in early 2021.    Since I last  saw Savannah [name], Savannah [name] had laboratory in October 2020.  Total cholesterol was 167, triglycerides 185, HDL 41, LDL 94.  At that time, I had suggested the patient increase Zetia to 10 mg daily and also increase Vascepa to daily and Savannah [name] had only been taking these medicines 2 days/week.  Savannah [name] was evaluated by Dr. Gala Romney in November 2020.  An echo Doppler study December 30, 2018 showed EF 60 to 65% with grade 1 diastolic dysfunction.  Atrial size was normal.  Savannah [name] was felt to be well compensated guarding Savannah [name] scleroderma with related mild interstitial lung disease.  Savannah [name] echo Doppler study there was  no evidence for pulmonary hypertension or RV strain.  Savannah [name] PFTs showed stable spirometry and improved DLCO.  A high resolution CT in October 2020 showed mild ILD slightly improved from previous assessment.  Savannah [name] was noted to have coronary calcification on CT imaging.  Savannah [name] was active without chest pain or exertional dyspnea and preferred avoiding further testing.  I saw Savannah [name] in January 2021 prior to Savannah [name] returning back to [place].  At that time Savannah [name] denied any chest pain, PND orthopnea.  Savannah [name] had  been taking losartan 100 mg daily.  Savannah [name] has a prescription for furosemide but Savannah [name] has not required use and had not taken this recently.  Apparently Savannah [name] was still only taking Zetia 10 mg 2 days/week and Vascepa 1 g by mouth once a week.  During that evaluation, I again reviewed Savannah [name] coronary CTA which revealed a calcium score of 20 and mild plaque in the LAD, circumflex and RCA.  At that time Savannah [name] was no longer taking Livalo but had tolerated that well in the past and I recommended resumption of Livalo 2 mg and to try initiating this at least 2 days/week.  I also recommended Savannah [name] try increasing Zetia if not to 10 mg daily then at least every other day.  I last saw Savannah [name] on January 09, 2020.  Savannah [name] had recently returned from [place] with Savannah [name] husband.  Savannah [name] blood pressure had been somewhat labile with systolic pressure ranging from 1 20-1 60.  On one occasion it had reached 172 mmHg.  Savannah [name] denies chest pain or significant shortness of breath.  Savannah [name] is scheduled to undergo pulmonary function studies on November 30 as well as a follow-up echo Doppler study on December 6.  Savannah [name] is also followed by Dr. Gala Romney for Savannah [name] scleroderma study and has a follow-up office visit with Dr. Marchelle Gearing regarding Savannah [name] interstitial lung disease.    Savannah [name] was evaluated by Edd Fabian in November 2022.  It was very active taking care of Savannah [name] husband who had severe Parkinson's disease.  Savannah [name] underwent a follow-up high-resolution chest CT on February 05, 2021  ordered by Dr. Marchelle Gearing which showed minimal basilar reticular opacities with no evidence for progression.  The findings were indeterminate for UIP.  There was bilateral air trapping which may be seen in the setting of small airways disease.  Savannah [name] was noted to have coronary artery calcifications and aortic atherosclerosis.  On February 12, 2021 Savannah [name] underwent a follow-up echo Doppler which showed EF 60 to 65% and grade 1 diastolic dysfunction.  PA pressure was normal and valves essentially were normal.    I saw Savannah [name] on November 27, 2021. Unfortunately Savannah [name] husband Timeko Tidrick who also was one of my patients died on November 22, 2021 at 77 years old.  Savannah [name] will be going down to [place] with a friend to help Savannah [name] with the department down there but  will be returning back to [place].  Savannah [name] denies any chest pain.  Savannah [name] notes rare indigestion.  Savannah [name] continues to have scleroderma and at times notes trace ankle swelling.  Savannah [name] has not had recent laboratory.  Savannah [name] states Savannah [name] blood pressure at home typically runs in the 130s to 150s.  Savannah [name] had recently been started on metoprolol succinate and was taking this 25 mg twice a day.    I last saw Savannah [name] Jun 18, 2022.  Was continuing to go down to [place] every several months.  Savannah [name] has been on losartan 100 mg metoprolol succinate 50 mg daily for hypertension.  Savannah [name] brought with Savannah [name] blood pressure recordings, blood pressure oftentimes is mildly elevated.  Savannah [name] blood pressures have ranged from systolics in the 1 teens and 120s to systolics in the 150s 160s and rarely 170s.  Oftentimes Savannah [name] may note Savannah [name] higher blood pressure in the morning.  Savannah [name] denies any chest pain or shortness of breath.  Savannah [name] is unaware of palpitations.  Savannah [name] has not had laboratory since October 2023 which showed total cholesterol 139 HDL 48, LDL 63 and triglycerides 167.  Savannah [name] apparently has only been taking Vascepa 1 capsule 3 times a week and Zetia 3 times per week Livalo 2 mg 3 times a week.  Savannah [name] is on aspirin 81 mg.     Since I last saw Savannah [name], Savannah [name] has been evaluated by [facility] and is in need to undergo left total hip arthroplasty is tentatively scheduled for March 25, 2023 in [place] near [place].  Call us of Savannah [name] hip issues, Savannah [name] has not been able to exercise.  Savannah [name] has not been down to [place].  Savannah [name] has been taking Savannah [name] blood pressures at home and states typically these often run 1 30-1 50 range systolically.  Has been taking 40 mg at bedtime and metoprolol succinate 50 mg in the morning he has a prescription for furosemide which Savannah [name] takes rarely for additional leg swelling.  Savannah [name] has a history of mixed hyperlipidemia and apparently reduced Savannah [name] medications and is now only taking Zetia 10 mg 2 times per week, Vascepa capsule 3 times a week, pitavastatin 2 mg only twice a week.  In the past Savannah [name] did not tolerate rosuvastatin or atorvastatin.  Savannah [name] had undergone laboratory in June 2024 which showed lipids with total cholesterol 231, triglycerides 193, LDL cholesterol 140.    Past Medical History:  Diagnosis Date   Dyspnea    MYOVIEW, 12/29/2006 - post-stress EF 81%,no ECG changes, EKG negative for ischemia   Hyperlipidemia    Hypertension    2D ECHO, 12/24/2006 - EF 45-55%, normal   Osteoarthritis    Swelling of extremity    LEA VENOUS DUPLEX, 08/19/2011 - deep valvular insufficiency noted with right femoral and popliteal veins and left common femoral vein   TIA (transient ischemic attack)    CAROTID DOPPLER, 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    Past Surgical History:  Procedure Laterality Date   TONSILLECTOMY      Current Medications: Outpatient Medications Prior to Visit  Medication Sig Dispense Refill   aspirin 81 MG tablet Take 81 mg by mouth.     b complex vitamins capsule Take 1 capsule by mouth 2 (two) times a week.     Calcium Carbonate (CALCIUM 600 PO) Take by mouth 3 (three) times a week.     Cholecalciferol (D3 MAXIMUM STRENGTH PO) Take 5,000 Units by  mouth 2 (two) times a week.  Coenzyme Q10 200 MG capsule Take 200 mg by mouth daily.     diclofenac (VOLTAREN) 0.1 % ophthalmic solution Place 1 drop into the left eye 2 (two) times daily.     exemestane (AROMASIN) 25 MG tablet Take 25 mg by mouth daily after breakfast.     ezetimibe (ZETIA) 10 MG tablet Take 10 mg by mouth 2 (two) times a week.     furosemide (LASIX) 20 MG tablet TAKE 20 MG( 1/2 TABLET ) TO 40 MG ( 1 TABLET) AS NEEDED FOR FLUID OR EDEMA 180 tablet 1   icosapent Ethyl (VASCEPA) 1 g capsule Take 1 capsule (1 g total) by mouth 2 (two) times a week. (Patient taking differently: Take 1 g by mouth 3 (three) times a week.) 24 capsule 2   loratadine (CLARITIN) 10 MG tablet Take 10 mg by mouth daily as needed for allergies.     Magnesium 300 MG CAPS Take 300 mg by mouth 3 (three) times a week.     MAGNESIUM CL-CALCIUM CARBONATE PO Take 1,000 mg by mouth daily.     Menaquinone-7 (VITAMIN K2 PO) Take 50 mcg by mouth 2 (two) times a week.      metoprolol succinate (TOPROL-XL) 50 MG 24 hr tablet TAKE 1 TABLET BY MOUTH EVERY DAY. TAKE WITH OR IMMEDIATELY FOLLOWING A MEAL. 90 tablet 3   olmesartan (BENICAR) 40 MG tablet Take 1 tablet (40 mg total) by mouth daily. 90 tablet 3   Pitavastatin Calcium 2 MG TABS TAKE 1 TABLET BY MOUTH TWICE WEEKLY 26 tablet 13   potassium chloride SA (KLOR-CON M20) 20 MEQ tablet Take 1 tablet (20 mEq total) by mouth daily as needed. When you take Furosemide (Lasix). 90 tablet 3   valACYclovir (VALTREX) 1000 MG tablet Take 1 tablet (1,000 mg total) by mouth 2 (two) times daily. (Patient taking differently: Take 1,000 mg by mouth as needed.) 180 tablet 1   vitamin E 180 MG (400 UNITS) capsule Take 400 Units by mouth daily.     anastrozole (ARIMIDEX) 1 MG tablet Take 1 mg by mouth every morning.     No facility-administered medications prior to visit.     Allergies:   Codeine, Cymbalta [duloxetine hcl], Lyrica [pregabalin], Morphine, Penicillins, Sulfa  antibiotics, Zetia [ezetimibe], and Zocor [simvastatin]   Social History   Socioeconomic History   Marital status: Widowed    Spouse name: Not on file   Number of children: Not on file   Years of education: Not on file   Highest education level: Not on file  Occupational History   Not on file  Tobacco Use   Smoking status: Never   Smokeless tobacco: Never  Vaping Use   Vaping status: Never Used  Substance and Sexual Activity   Alcohol use: Yes    Alcohol/week: 6.0 standard drinks of alcohol    Types: 6 drink(s) per week   Drug use: No   Sexual activity: Not on file  Other Topics Concern   Not on file  Social History Narrative   Not on file   Social Drivers of Health   Financial Resource Strain: Low Risk  (07/01/2022)   Received from [facility]   Overall Financial Resource Strain (CARDIA)    Difficulty of Paying Living Expenses: Not hard at all  Food Insecurity: No Food Insecurity (07/01/2022)   Received from [facility], [facility]   Hunger Vital Sign    Worried About Running Out of Food in the Last Year:  Never true    Ran Out of Food in the Last Year: Never true  Transportation Needs: No Transportation Needs (07/01/2022)   Received from [facility], [facility]   PRAPARE - Transportation    Lack of Transportation (Medical): No    Lack of Transportation (Non-Medical): No  Physical Activity: Not on file  Stress: Not on file  Social Connections: Unknown (07/02/2021)   Received from [facility], [facility]   Social Network    Social Network: Not on file     Family History:  The patient's family history includes Angina (age of onset: 68) in Savannah [name] maternal grandfather; Atrial fibrillation (age of onset: 44) in Savannah [name] mother; Cirrhosis (age of onset: 58) in Savannah [name] father; Heart attack (age of onset: 41) in Savannah [name] maternal grandfather; Hyperlipidemia in Savannah [name] father; Hypertension in Savannah [name] mother; Hypertension (age of onset: 32) in Savannah [name] maternal grandmother;  Kidney failure (age of onset: 69) in Savannah [name] father; Lung disease in Savannah [name] paternal grandmother; Stroke (age of onset: 64) in Savannah [name] maternal grandmother.   ROS General: Negative; No fevers, chills, or night sweats;  HEENT: Negative; No changes in vision or hearing, sinus congestion, difficulty swallowing Pulmonary: mild interstitial lung disease Cardiovascular: See HPI GI: Negative; No nausea, vomiting, diarrhea, or abdominal pain GU: Negative; No dysuria, hematuria, or difficulty voiding Musculoskeletal: Chronic left hip discomfort, and need for left total hip arthroplasty Rheumatologic: scleroderma Hematologic/Oncology: Negative; no easy bruising, bleeding Endocrine: Negative; no heat/cold intolerance; no diabetes Neuro: History of TIA Skin: Negative; No rashes or skin lesions Psychiatric: Negative; No behavioral problems, depression Sleep: Positive for snoring, no daytime sleepiness, hypersomnolence, bruxism, restless legs, hypnogognic hallucinations, no cataplexy Other comprehensive 14 point system review is negative.   PHYSICAL EXAM:   VS:  BP 132/86   Pulse 77   Ht 5\' 6"  (1.676 m)   Wt 188 lb 6.4 oz (85.5 kg)   SpO2 96%   BMI 30.41 kg/m     Repeat blood pressure by me was 122/78.  Wt Readings from Last 3 Encounters:  02/02/23 188 lb 6.4 oz (85.5 kg)  01/27/23 187 lb 3.2 oz (84.9 kg)  12/02/22 182 lb (82.6 kg)    General: Alert, oriented, no distress.  Skin: normal turgor, no rashes, warm and dry HEENT: Normocephalic, atraumatic. Pupils equal round and reactive to light; sclera anicteric; extraocular muscles intact;  Nose without nasal septal hypertrophy Mouth/Parynx benign; Mallinpatti scale 3 Neck: No JVD, no carotid bruits; normal carotid upstroke Lungs: clear to ausculatation and percussion; no wheezing or rales Chest wall: without tenderness to palpitation Heart: PMI not displaced, RRR, s1 s2 normal, 1/6 systolic murmur, no diastolic murmur, no rubs, gallops, thrills, or  heaves Abdomen: soft, nontender; no hepatosplenomehaly, BS+; abdominal aorta nontender and not dilated by palpation. Back: no CVA tenderness Pulses 2+ Musculoskeletal: full range of motion, normal strength, no joint deformities Extremities: no clubbing cyanosis or edema, Homan's sign negative  Neurologic: grossly nonfocal; Cranial nerves grossly wnl Psychologic: Normal mood and affect    Studies/Labs Reviewed:   EKG Interpretation Date/Time:  Monday February 02 2023 14:12:23 EST Ventricular Rate:  77 PR Interval:  190 QRS Duration:  74 QT Interval:  362 QTC Calculation: 409 R Axis:   -5  Text Interpretation: Normal sinus rhythm Normal ECG When compared with ECG of 18-Aug-2014 11:13, Minimal criteria for Anterior infarct are no longer Present Confirmed by Nicki Guadalajara ([id number]) on 02/02/2023 3:16:45 PM    Jun 18, 2022 ECG (independently read by me): Normal sinus  rhythm at 79 bpm.  Small Q wave lead III and aVF.  No ectopy.  No ST segment changes.  November 27, 2021 ECG (independently read by me): NS at 78, mild sinus arrythmia  January 09, 2020 ECG (independently read by me): Normal sinus rhythm with mild sinus arrhythmia at 78 bpm.  Borderline first-degree block with a PR interval 206 ms;QTc interval 419 ms.  January 2021 ECG (independently read by me): Normal sinus rhythm at 75 bpm.  Poor anterior R wave progression V1 through V3.  No ectopy.  Normal intervals.  October 2020 ECG (independently read by me): NSR at 74; normal interval, no extopy  December 2019 ECG (independently read by me): Normal sinus rhythm at 82 bpm.  No ectopy.  Normal intervals.  Recent Labs:    Latest Ref Rng & Units 12/02/2022   11:39 AM 07/30/2022   10:40 AM 11/27/2021    9:33 AM  BMP  Glucose 70 - 99 mg/dL 82  96  098   BUN 6 - 23 mg/dL 18  16  13    Creatinine 0.40 - 1.20 mg/dL 1.19  1.47  8.29   BUN/Creat Ratio 12 - 28  15  14    Sodium 135 - 145 mEq/L 141  141  140   Potassium 3.5 - 5.1 mEq/L  4.2  4.6  4.8   Chloride 96 - 112 mEq/L 106  104  103   CO2 19 - 32 mEq/L 29  26  26    Calcium 8.4 - 10.5 mg/dL 56.2  13.0  86.5         Latest Ref Rng & Units 07/30/2022   10:40 AM 11/27/2021    9:33 AM 10/16/2020   12:00 AM  Hepatic Function  Total Protein 6.0 - 8.5 g/dL 6.6  6.4    Albumin 3.8 - 4.8 g/dL 4.3  4.4  4.7      AST 0 - 40 IU/L 13  22  18       ALT 0 - 32 IU/L 14  29  25       Alk Phosphatase 44 - 121 IU/L 69  70  65      Total Bilirubin 0.0 - 1.2 mg/dL 1.1  1.3       This result is from an external source.       Latest Ref Rng & Units 07/30/2022   10:40 AM 11/27/2021    9:33 AM 10/16/2020   12:00 AM  CBC  WBC 3.4 - 10.8 x10E3/uL 6.4  6.1  6.6      Hemoglobin 11.1 - 15.9 g/dL 78.4  69.6  29.5      Hematocrit 34.0 - 46.6 % 42.6  41.2  43      Platelets 150 - 450 x10E3/uL 274  278  265         This result is from an external source.   Lab Results  Component Value Date   MCV 91 07/30/2022   MCV 90 11/27/2021   MCV 90 01/09/2020   Lab Results  Component Value Date   TSH 3.43 12/02/2022   Lab Results  Component Value Date   HGBA1C 5.2 11/27/2021     BNP No results found for: "BNP"  ProBNP No results found for: "PROBNP"   Lipid Panel     Component Value Date/Time   CHOL 231 (H) 07/30/2022 1040   TRIG 193 (H) 07/30/2022 1040   HDL 57 07/30/2022 1040   CHOLHDL 4.1 07/30/2022 1040  CHOLHDL 4 01/16/2021 1524   VLDL 69.4 (H) 01/16/2021 1524   LDLCALC 140 (H) 07/30/2022 1040   LDLDIRECT 73.0 01/16/2021 1524     RADIOLOGY: No results found.   Additional studies/ records that were reviewed today include:  I reviewed the patient's evaluations from Dr. Gala Romney and Marchelle Gearing.  I reviewed Savannah [name] prior echo Doppler studies, chest CT and laboratory.   ECHO 12/30/2018 IMPRESSIONS   1. Left ventricular ejection fraction, by visual estimation, is 60 to  65%. The left ventricle has normal function. There is no left ventricular  hypertrophy.   2.  Left ventricular diastolic parameters are consistent with Grade I  diastolic dysfunction (impaired relaxation).   3. Global right ventricle has normal systolic function.The right  ventricular size is normal. No increase in right ventricular wall  thickness.   4. Left atrial size was normal.   5. Right atrial size was normal.   6. The mitral valve is normal in structure. Trace mitral valve  regurgitation.   7. The tricuspid valve is normal in structure. Tricuspid valve  regurgitation is trivial.   8. The aortic valve is normal in structure. Aortic valve regurgitation is  not visualized.   9. The pulmonic valve was grossly normal. Pulmonic valve regurgitation is  not visualized.  10. The inferior vena cava is normal in size with greater than 50%  respiratory variability, suggesting right atrial pressure of 3 mmHg.   ASSESSMENT:    1. Pre-operative clearance   2. Primary hypertension   3. Mixed hyperlipidemia   4. ILD (interstitial lung disease) (HCC)   5. Scleroderma (HCC)   6. Diastolic dysfunction     PLAN:  Savannah [name] is a 77 year-old female who has a history of hypertension, significant mixed hyperlipidemia with prior triglycerides at 669 in 2018, remote history of TIA, lower extremity varicose veins, as well as a biopsy-proven history of scleroderma.  Savannah [name] has not been found to have pulmonary hypertension.  Savannah [name] has mild interstitial lung disease followed by Dr. Marchelle Gearing.  Savannah [name] has aortic atherosclerosis and multivessel plaque on a high-resolution chest CT.   Calcium score was 20 and there was evidence for mild plaque less than 30% in the LAD, circumflex, and RCA.  In the past, Savannah [name] had intolerances to several of the statins but has been able to take Livalo.  Oftentimes, Savannah [name] has only been taking Savannah [name] lipid-lowering therapy 2 to at most 3 days/week.  I reviewed Savannah [name] most recent lipid assessment from June 2024 which showed total cholesterol 231, HDL 57, LDL 140, triglycerides 193.  Savannah [name]  has only been taking Zetia 2 days/week, instead of multiple capsules of Vascepa Savannah [name] has only been taking 1 capsule 3 days a week, and pitavastain 2 mg only twice a week.  I have recommended that Savannah [name] at least take Zetia on a daily basis and Savannah [name] statin on a daily basis.  Ideally Savannah [name] should at least take Vascepa daily as well preferably 1 twice a day instead of just 1.  Savannah [name] brought Savannah [name] blood pressure recordings from home.  Savannah [name] recordings show blood pressure elevation in the 130s to 157 range systolically.  At times Savannah [name] experiences leg swelling and has been taking furosemide 20 mg every several weeks.  I have suggested Savannah [name] discontinue olmesartan 40 mg and I will change Savannah [name] to combination therapy with olmesartan/HCTZ 40/12.5 mg.  This in addition with Savannah [name] continued regimen of metoprolol succinate should allow for improved blood pressure control.  Savannah [name] can take an  extra furosemide on an as needed basis depending upon edema.  I provided Savannah [name] with a copy of an EKG today so that Savannah [name] can take this to [facility] and also to Dr. Yetta Barre who Savannah [name] will see next week.  I suggested that when he checks laboratory he also recheck lipid panel and also LP(a) in addition to chemistry and CBC.  At present there are no plans for Savannah [name] to go to [place] as Savannah [name] surgical date is not until February and Savannah [name] will need to stay local for time after surgery.  I will try to see Savannah [name] in April for follow-up evaluation.  I discussed with Savannah [name] my plans for upcoming retirement in mid 2025.  I will transition Savannah [name] to the care of Dr. Royann Shivers  who had taken care of of Savannah [name] husband before he passed away.   Medication Adjustments/Labs and Tests Ordered: Current medicines are reviewed at length with the patient today.  Concerns regarding medicines are outlined above.  Medication changes, Labs and Tests ordered today are listed in the Patient Instructions below. Patient Instructions  Medication Instructions:  Begin Olmesartan -hydrochlorothiazide  40-12.5mg   *If you need a refill on your cardiac medications before your next appointment, please call your pharmacy*   Lab Work: None If you have labs (blood work) drawn today and your tests are completely normal, you will receive your results only by: MyChart Message (if you have MyChart) OR A paper copy in the mail If you have any lab test that is abnormal or we need to change your treatment, we will call you to review the results.   Testing/Procedures: None   Follow-Up: At [facility], you and your health needs are our priority.  As part of our continuing mission to provide you with exceptional heart care, we have created designated Provider Care Teams.  These Care Teams include your primary Cardiologist (physician) and Advanced Practice Providers (APPs -  Physician Assistants and Nurse Practitioners) who all work together to provide you with the care you need, when you need it.  We recommend signing up for the patient portal called "MyChart".  Sign up information is provided on this After Visit Summary.  MyChart is used to connect with patients for Virtual Visits (Telemedicine).  Patients are able to view lab/test results, encounter notes, upcoming appointments, etc.  Non-urgent messages can be sent to your provider as well.   To learn more about what you can do with MyChart, go to [email or web address].    Your next appointment:   4 month(s)  Provider:   Nicki Guadalajara, MD      Signed, Nicki Guadalajara, MD  02/02/2023 5:12 PM    [facility] [address], [address], [place], [place]  [address] Phone: [phone]

## 2023-02-02 NOTE — Patient Instructions (Addendum)
Medication Instructions:  Begin Olmesartan -hydrochlorothiazide 40-12.5mg   *If you need a refill on your cardiac medications before your next appointment, please call your pharmacy*   Lab Work: None If you have labs (blood work) drawn today and your tests are completely normal, you will receive your results only by: MyChart Message (if you have MyChart) OR A paper copy in the mail If you have any lab test that is abnormal or we need to change your treatment, we will call you to review the results.   Testing/Procedures: None   Follow-Up: At [facility], you and your health needs are our priority.  As part of our continuing mission to provide you with exceptional heart care, we have created designated Provider Care Teams.  These Care Teams include your primary Cardiologist (physician) and Advanced Practice Providers (APPs -  Physician Assistants and Nurse Practitioners) who all work together to provide you with the care you need, when you need it.  We recommend signing up for the patient portal called "MyChart".  Sign up information is provided on this After Visit Summary.  MyChart is used to connect with patients for Virtual Visits (Telemedicine).  Patients are able to view lab/test results, encounter notes, upcoming appointments, etc.  Non-urgent messages can be sent to your provider as well.   To learn more about what you can do with MyChart, go to [email or web address].    Your next appointment:   4 month(s)  Provider:   Nicki Guadalajara, MD

## 2023-02-03 ENCOUNTER — Telehealth: Payer: Self-pay

## 2023-02-03 ENCOUNTER — Other Ambulatory Visit: Payer: Self-pay

## 2023-02-03 DIAGNOSIS — I10 Essential (primary) hypertension: Secondary | ICD-10-CM

## 2023-02-03 DIAGNOSIS — E782 Mixed hyperlipidemia: Secondary | ICD-10-CM

## 2023-02-03 DIAGNOSIS — Z8673 Personal history of transient ischemic attack (TIA), and cerebral infarction without residual deficits: Secondary | ICD-10-CM

## 2023-02-03 DIAGNOSIS — M349 Systemic sclerosis, unspecified: Secondary | ICD-10-CM

## 2023-02-03 DIAGNOSIS — I2510 Atherosclerotic heart disease of native coronary artery without angina pectoris: Secondary | ICD-10-CM

## 2023-02-03 NOTE — Telephone Encounter (Signed)
Pt's pharmacy [facility] stating that pt was told to start taking Vascepa 1 g daily. This was not changed on pt's medication list. Does Dr. Tresa Endo want the pt to start taking Vascepa 1 g daily and pt is requesting DAW for brand. Please address

## 2023-02-03 NOTE — Telephone Encounter (Signed)
Faxed office notes and EKG to [facility] for surgical clearance.

## 2023-02-05 ENCOUNTER — Ambulatory Visit: Payer: Medicare Other | Admitting: Internal Medicine

## 2023-02-05 ENCOUNTER — Encounter: Payer: Self-pay | Admitting: Internal Medicine

## 2023-02-05 VITALS — BP 144/78 | HR 83 | Temp 98.0°F | Resp 16 | Ht 66.0 in | Wt 183.8 lb

## 2023-02-05 DIAGNOSIS — N1831 Chronic kidney disease, stage 3a: Secondary | ICD-10-CM | POA: Diagnosis not present

## 2023-02-05 DIAGNOSIS — G8929 Other chronic pain: Secondary | ICD-10-CM | POA: Diagnosis not present

## 2023-02-05 DIAGNOSIS — D485 Neoplasm of uncertain behavior of skin: Secondary | ICD-10-CM | POA: Diagnosis not present

## 2023-02-05 DIAGNOSIS — Z85828 Personal history of other malignant neoplasm of skin: Secondary | ICD-10-CM | POA: Diagnosis not present

## 2023-02-05 DIAGNOSIS — C44212 Basal cell carcinoma of skin of right ear and external auricular canal: Secondary | ICD-10-CM | POA: Diagnosis not present

## 2023-02-05 DIAGNOSIS — R635 Abnormal weight gain: Secondary | ICD-10-CM | POA: Insufficient documentation

## 2023-02-05 DIAGNOSIS — N3000 Acute cystitis without hematuria: Secondary | ICD-10-CM | POA: Diagnosis not present

## 2023-02-05 DIAGNOSIS — I10 Essential (primary) hypertension: Secondary | ICD-10-CM | POA: Diagnosis not present

## 2023-02-05 DIAGNOSIS — G4701 Insomnia due to medical condition: Secondary | ICD-10-CM | POA: Diagnosis not present

## 2023-02-05 DIAGNOSIS — L820 Inflamed seborrheic keratosis: Secondary | ICD-10-CM | POA: Diagnosis not present

## 2023-02-05 DIAGNOSIS — L821 Other seborrheic keratosis: Secondary | ICD-10-CM | POA: Diagnosis not present

## 2023-02-05 DIAGNOSIS — D225 Melanocytic nevi of trunk: Secondary | ICD-10-CM | POA: Diagnosis not present

## 2023-02-05 DIAGNOSIS — E785 Hyperlipidemia, unspecified: Secondary | ICD-10-CM

## 2023-02-05 DIAGNOSIS — D2261 Melanocytic nevi of right upper limb, including shoulder: Secondary | ICD-10-CM | POA: Diagnosis not present

## 2023-02-05 DIAGNOSIS — D2262 Melanocytic nevi of left upper limb, including shoulder: Secondary | ICD-10-CM | POA: Diagnosis not present

## 2023-02-05 DIAGNOSIS — E781 Pure hyperglyceridemia: Secondary | ICD-10-CM | POA: Diagnosis not present

## 2023-02-05 DIAGNOSIS — D2239 Melanocytic nevi of other parts of face: Secondary | ICD-10-CM | POA: Diagnosis not present

## 2023-02-05 LAB — CBC WITH DIFFERENTIAL/PLATELET
Basophils Absolute: 0 10*3/uL (ref 0.0–0.1)
Basophils Relative: 0.7 % (ref 0.0–3.0)
Eosinophils Absolute: 0.2 10*3/uL (ref 0.0–0.7)
Eosinophils Relative: 2.9 % (ref 0.0–5.0)
HCT: 42.3 % (ref 36.0–46.0)
Hemoglobin: 14.7 g/dL (ref 12.0–15.0)
Lymphocytes Relative: 27.8 % (ref 12.0–46.0)
Lymphs Abs: 2 10*3/uL (ref 0.7–4.0)
MCHC: 34.7 g/dL (ref 30.0–36.0)
MCV: 93.5 fL (ref 78.0–100.0)
Monocytes Absolute: 0.6 10*3/uL (ref 0.1–1.0)
Monocytes Relative: 9.1 % (ref 3.0–12.0)
Neutro Abs: 4.2 10*3/uL (ref 1.4–7.7)
Neutrophils Relative %: 59.5 % (ref 43.0–77.0)
Platelets: 265 10*3/uL (ref 150.0–400.0)
RBC: 4.53 Mil/uL (ref 3.87–5.11)
RDW: 12.7 % (ref 11.5–15.5)
WBC: 7 10*3/uL (ref 4.0–10.5)

## 2023-02-05 LAB — BASIC METABOLIC PANEL
BUN: 21 mg/dL (ref 6–23)
CO2: 31 meq/L (ref 19–32)
Calcium: 10.5 mg/dL (ref 8.4–10.5)
Chloride: 102 meq/L (ref 96–112)
Creatinine, Ser: 0.91 mg/dL (ref 0.40–1.20)
GFR: 60.47 mL/min (ref >60.00)
Glucose, Bld: 116 mg/dL — ABNORMAL HIGH (ref 70–99)
Potassium: 4.1 meq/L (ref 3.5–5.1)
Sodium: 141 meq/L (ref 135–145)

## 2023-02-05 LAB — URINALYSIS, ROUTINE W REFLEX MICROSCOPIC
Bilirubin Urine: NEGATIVE
Hgb urine dipstick: NEGATIVE
Ketones, ur: NEGATIVE
Leukocytes,Ua: NEGATIVE
Nitrite: POSITIVE — AB
Specific Gravity, Urine: 1.025 (ref 1.000–1.030)
Total Protein, Urine: NEGATIVE
Urine Glucose: NEGATIVE
Urobilinogen, UA: 0.2 (ref 0.0–1.0)
pH: 6 (ref 5.0–8.0)

## 2023-02-05 LAB — LIPID PANEL
Cholesterol: 123 mg/dL (ref 0–200)
HDL: 34.7 mg/dL — ABNORMAL LOW (ref >39.00)
LDL Cholesterol: 45 mg/dL (ref 0–99)
NonHDL: 88.3
Total CHOL/HDL Ratio: 4
Triglycerides: 217 mg/dL — ABNORMAL HIGH (ref 0.0–149.0)
VLDL: 43.4 mg/dL — ABNORMAL HIGH (ref 0.0–40.0)

## 2023-02-05 MED ORDER — NITROFURANTOIN MONOHYD MACRO 100 MG PO CAPS: 100.0000 mg | ORAL_CAPSULE | Freq: Two times a day (BID) | ORAL | 0 refills | Status: AC

## 2023-02-05 MED ORDER — BELSOMRA 15 MG PO TABS: 15.0000 mg | ORAL_TABLET | Freq: Every evening | ORAL | 0 refills | Status: DC | PRN

## 2023-02-05 NOTE — Patient Instructions (Signed)
Insomnia Insomnia is a sleep disorder that makes it difficult to fall asleep or stay asleep. Insomnia can cause fatigue, low energy, difficulty concentrating, mood swings, and poor performance at work or school. There are three different ways to classify insomnia: Difficulty falling asleep. Difficulty staying asleep. Waking up too early in the morning. Any type of insomnia can be long-term (chronic) or short-term (acute). Both are common. Short-term insomnia usually lasts for 3 months or less. Chronic insomnia occurs at least three times a week for longer than 3 months. What are the causes? Insomnia may be caused by another condition, situation, or substance, such as: Having certain mental health conditions, such as anxiety and depression. Using caffeine, alcohol, tobacco, or drugs. Having gastrointestinal conditions, such as gastroesophageal reflux disease (GERD). Having certain medical conditions. These include: Asthma. Alzheimer's disease. Stroke. Chronic pain. An overactive thyroid gland (hyperthyroidism). Other sleep disorders, such as restless legs syndrome and sleep apnea. Menopause. Sometimes, the cause of insomnia may not be known. What increases the risk? Risk factors for insomnia include: Gender. Females are affected more often than males. Age. Insomnia is more common as people get older. Stress and certain medical and mental health conditions. Lack of exercise. Having an irregular work schedule. This may include working night shifts and traveling between different time zones. What are the signs or symptoms? If you have insomnia, the main symptom is having trouble falling asleep or having trouble staying asleep. This may lead to other symptoms, such as: Feeling tired or having low energy. Feeling nervous about going to sleep. Not feeling rested in the morning. Having trouble concentrating. Feeling irritable, anxious, or depressed. How is this diagnosed? This condition  may be diagnosed based on: Your symptoms and medical history. Your health care provider may ask about: Your sleep habits. Any medical conditions you have. Your mental health. A physical exam. How is this treated? Treatment for insomnia depends on the cause. Treatment may focus on treating an underlying condition that is causing the insomnia. Treatment may also include: Medicines to help you sleep. Counseling or therapy. Lifestyle adjustments to help you sleep better. Follow these instructions at home: Eating and drinking  Limit or avoid alcohol, caffeinated beverages, and products that contain nicotine and tobacco, especially close to bedtime. These can disrupt your sleep. Do not eat a large meal or eat spicy foods right before bedtime. This can lead to digestive discomfort that can make it hard for you to sleep. Sleep habits  Keep a sleep diary to help you and your health care provider figure out what could be causing your insomnia. Write down: When you sleep. When you wake up during the night. How well you sleep and how rested you feel the next day. Any side effects of medicines you are taking. What you eat and drink. Make your bedroom a dark, comfortable place where it is easy to fall asleep. Put up shades or blackout curtains to block light from outside. Use a white noise machine to block noise. Keep the temperature cool. Limit screen use before bedtime. This includes: Not watching TV. Not using your smartphone, tablet, or computer. Stick to a routine that includes going to bed and waking up at the same times every day and night. This can help you fall asleep faster. Consider making a quiet activity, such as reading, part of your nighttime routine. Try to avoid taking naps during the day so that you sleep better at night. Get out of bed if you are still awake after   15 minutes of trying to sleep. Keep the lights down, but try reading or doing a quiet activity. When you feel  sleepy, go back to bed. General instructions Take over-the-counter and prescription medicines only as told by your health care provider. Exercise regularly as told by your health care provider. However, avoid exercising in the hours right before bedtime. Use relaxation techniques to manage stress. Ask your health care provider to suggest some techniques that may work well for you. These may include: Breathing exercises. Routines to release muscle tension. Visualizing peaceful scenes. Make sure that you drive carefully. Do not drive if you feel very sleepy. Keep all follow-up visits. This is important. Contact a health care provider if: You are tired throughout the day. You have trouble in your daily routine due to sleepiness. You continue to have sleep problems, or your sleep problems get worse. Get help right away if: You have thoughts about hurting yourself or someone else. Get help right away if you feel like you may hurt yourself or others, or have thoughts about taking your own life. Go to your nearest emergency room or: Call 911. Call the National Suicide Prevention Lifeline at 1-800-273-8255 or 988. This is open 24 hours a day. Text the Crisis Text Line at 741741. Summary Insomnia is a sleep disorder that makes it difficult to fall asleep or stay asleep. Insomnia can be long-term (chronic) or short-term (acute). Treatment for insomnia depends on the cause. Treatment may focus on treating an underlying condition that is causing the insomnia. Keep a sleep diary to help you and your health care provider figure out what could be causing your insomnia. This information is not intended to replace advice given to you by your health care provider. Make sure you discuss any questions you have with your health care provider. Document Revised: 01/14/2021 Document Reviewed: 01/14/2021 Elsevier Patient Education  2024 Elsevier Inc.  

## 2023-02-05 NOTE — Progress Notes (Signed)
Subjective:  Patient ID: Savannah [name], female    DOB: Jul 08, 1945  Age: 77 y.o. MRN: [id number]  CC: Hypertension   HPI ARVENA CALAMARI presents for f/up ----  Discussed the use of AI scribe software for clinical note transcription with the patient, who gave verbal consent to proceed.  History of Present Illness   The patient presents with a primary complaint of hip pain, which has been ongoing since September. 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 The patient reports general weakness, which they attribute to limited mobility due to the hip issue. They deny any numbness or tingling.       Outpatient Medications Prior to Visit  Medication Sig Dispense Refill   aspirin 81 MG tablet Take 81 mg by mouth.     b complex vitamins capsule Take 1 capsule by mouth 2 (two) times a week.     Calcium Carbonate (CALCIUM 600 PO) Take 600 mg by mouth daily.     Cholecalciferol (D3 MAXIMUM STRENGTH PO) Take 5,000 Units by mouth 3 (three) times a week.     Coenzyme Q10 200 MG capsule Take 200 mg by mouth daily.     diclofenac (VOLTAREN) 0.1 % ophthalmic solution Place 1 drop into the left eye 2 (two) times daily.     exemestane (AROMASIN) 25 MG tablet Take 25 mg by mouth daily after breakfast.     ezetimibe (ZETIA) 10 MG tablet Take 10 mg by mouth daily.     furosemide (LASIX) 20 MG tablet TAKE 20 MG( 1/2 TABLET ) TO 40 MG ( 1 TABLET) AS NEEDED FOR FLUID OR EDEMA 180 tablet 1   HYDROcodone-acetaminophen (NORCO/VICODIN) 5-325 MG tablet Take 1 tablet by mouth every 6 (six) hours as needed for moderate pain (pain score 4-6) or severe pain (pain score 7-10).     icosapent Ethyl (VASCEPA) 1 g capsule Take 1 capsule (1 g total) by mouth 2 (two) times a week. (Patient taking differently: Take 1 g by mouth daily.) 24 capsule 2   loratadine (CLARITIN) 10 MG tablet Take 10 mg by mouth daily as needed for allergies.     Magnesium  300 MG CAPS Take 300 mg by mouth daily.     Menaquinone-7 (VITAMIN K2 PO) Take 50 mcg by mouth 3 (three) times a week.     metoprolol succinate (TOPROL-XL) 50 MG 24 hr tablet TAKE 1 TABLET BY MOUTH EVERY DAY. TAKE WITH OR IMMEDIATELY FOLLOWING A MEAL. 90 tablet 3   olmesartan-hydrochlorothiazide (BENICAR HCT) 40-12.5 MG tablet Take 1 tablet by mouth daily. 90 tablet 3   Pitavastatin Calcium 2 MG TABS TAKE 1 TABLET BY MOUTH TWICE WEEKLY (Patient taking differently: Take 2 mg by mouth daily. TAKE 1 TABLET BY MOUTH TWICE WEEKLY) 26 tablet 13   potassium chloride SA (KLOR-CON M20) 20 MEQ tablet Take 1 tablet (20 mEq total) by mouth daily as needed. When you take Furosemide (Lasix). 90 tablet 3   valACYclovir (VALTREX) 1000 MG tablet Take 1 tablet (1,000 mg total) by mouth 2 (two) times daily. (Patient taking differently: Take 1,000 mg by mouth as needed.) 180 tablet 1   vitamin E 180 MG (400 UNITS) capsule Take 400 Units by mouth daily.     MAGNESIUM CL-CALCIUM CARBONATE PO Take 400 mg by mouth daily.     olmesartan (BENICAR) 40 MG tablet Take 1 tablet (40  mg total) by mouth daily. 90 tablet 3   No facility-administered medications prior to visit.    ROS Review of Systems  Constitutional:  Negative for appetite change, chills, diaphoresis and fatigue.  HENT: Negative.    Respiratory: Negative.  Negative for cough, chest tightness, shortness of breath and wheezing.   Cardiovascular:  Negative for chest pain, palpitations and leg swelling.  Gastrointestinal:  Negative for abdominal pain, constipation, diarrhea, nausea and vomiting.  Endocrine: Negative.   Genitourinary:  Negative for difficulty urinating, dysuria and hematuria.  Musculoskeletal:  Positive for arthralgias. Negative for myalgias.  Skin: Negative.   Neurological:  Positive for weakness. Negative for dizziness and light-headedness.  Hematological:  Negative for adenopathy. Does not bruise/bleed easily.  Psychiatric/Behavioral:   Positive for sleep disturbance. Negative for behavioral problems, confusion, decreased concentration, dysphoric mood, hallucinations and suicidal ideas. The patient is not nervous/anxious.        Hip pain keeps her awake       Objective:  BP (!) 144/78 (BP Location: Left Arm, Patient Position: Sitting, Cuff Size: Normal)   Pulse 83   Temp 98 F (36.7 C) (Oral)   Resp 16   Ht 5\' 6"  (1.676 m)   Wt 183 lb 12.8 oz (83.4 kg)   SpO2 95%   BMI 29.67 kg/m   BP Readings from Last 3 Encounters:  02/05/23 (!) 144/78  02/02/23 132/86  12/02/22 134/76    Wt Readings from Last 3 Encounters:  02/05/23 183 lb 12.8 oz (83.4 kg)  02/02/23 188 lb 6.4 oz (85.5 kg)  01/27/23 187 lb 3.2 oz (84.9 kg)    Physical Exam Vitals reviewed.  Constitutional:      Appearance: Normal appearance.  HENT:     Nose: Nose normal.     Mouth/Throat:     Mouth: Mucous membranes are moist.  Eyes:     General: No scleral icterus.    Conjunctiva/sclera: Conjunctivae normal.  Cardiovascular:     Rate and Rhythm: Normal rate and regular rhythm.     Heart sounds: No murmur heard. Pulmonary:     Effort: Pulmonary effort is normal.     Breath sounds: No stridor. No wheezing, rhonchi or rales.  Abdominal:     General: Abdomen is flat.     Palpations: There is no mass.     Tenderness: There is no abdominal tenderness. There is no guarding.     Hernia: No hernia is present.  Musculoskeletal:        General: Normal range of motion.     Cervical back: Neck supple.     Right lower leg: No edema.     Left lower leg: No edema.  Lymphadenopathy:     Cervical: No cervical adenopathy.  Skin:    General: Skin is warm and dry.  Neurological:     General: No focal deficit present.     Mental Status: She is alert. Mental status is at baseline.  Psychiatric:        Mood and Affect: Mood normal.        Behavior: Behavior normal.     Lab Results  Component Value Date   WBC 7.0 02/05/2023   HGB 14.7 02/05/2023    HCT 42.3 02/05/2023   PLT 265.0 02/05/2023   GLUCOSE 116 (H) 02/05/2023   CHOL 123 02/05/2023   TRIG 217.0 (H) 02/05/2023   HDL 34.70 (L) 02/05/2023   LDLDIRECT 73.0 01/16/2021   LDLCALC 45 02/05/2023   ALT 14 07/30/2022  AST 13 07/30/2022   NA 141 02/05/2023   K 4.1 02/05/2023   CL 102 02/05/2023   CREATININE 0.91 02/05/2023   BUN 21 02/05/2023   CO2 31 02/05/2023   TSH 3.43 12/02/2022   HGBA1C 5.2 11/27/2021    CT Chest High Resolution Result Date: 02/06/2021 CLINICAL DATA:  Interstitial lung disease EXAM: CT CHEST WITHOUT CONTRAST TECHNIQUE: Multidetector CT imaging of the chest was performed following the standard protocol without intravenous contrast. High resolution imaging of the lungs, as well as inspiratory and expiratory imaging, was performed. COMPARISON:  CT chest dated February 09, 2020 FINDINGS: Cardiovascular: Normal heart size. No pericardial effusion. Three-vessel coronary artery calcifications. Atherosclerotic disease of the thoracic aorta. Mediastinum/Nodes: No pathologically enlarged lymph nodes seen in the chest. Esophagus is unremarkable. Lungs/Pleura: Central airways are patent. Mild bilateral air trapping. Mild focal bronchiectasis the right lower lobe, likely sequela of prior infection. Minimal reticular opacities are again seen at the lung bases and unchanged compared to prior exam. No traction bronchiectasis or honeycomb change. Stable solid pulmonary nodule of the right middle lobe measuring 3 mm on series 6, image 87, favored to be benign given 1 year stability. Upper Abdomen: No acute abnormality. Musculoskeletal: No chest wall mass or suspicious bone lesions identified. IMPRESSION: 1. Minimal basilar reticular opacities with no evidence of progression. Findings are indeterminate for UIP per consensus guidelines: Diagnosis of Idiopathic Pulmonary Fibrosis: An Official ATS/ERS/JRS/ALAT Clinical Practice Guideline. Am Rosezetta Schlatter Crit Care Med Vol 198, Iss 5,  [phone], Oct 18 2016. 2. Bilateral air trapping, findings can be seen in the setting of small airways disease. 3. Coronary artery calcifications. 4. Aortic Atherosclerosis (ICD10-I70.0). Electronically Signed   By: Allegra Lai M.D.   On: 02/06/2021 09:22    Assessment & Plan:  Pure hyperglyceridemia -     Lipid panel; Future  Dyslipidemia, goal LDL below 160 - LDL goal achieved. Doing well on the statin  -     Lipoprotein A (LPA); Future -     Lipid panel; Future -     Hepatic function panel; Future  Primary hypertension- Her BP is adequately well controlled. -     Basic metabolic panel; Future -     CBC with Differential/Platelet; Future -     Urinalysis, Routine w reflex microscopic; Future -     Hepatic function panel; Future  Stage 3a chronic kidney disease (HCC) -     Basic metabolic panel; Future -     Urinalysis, Routine w reflex microscopic; Future  Insomnia secondary to chronic pain -     Belsomra; Take 1 tablet (15 mg total) by mouth at bedtime as needed.  Dispense: 90 tablet; Refill: 0  Acute cystitis without hematuria -     Nitrofurantoin Monohyd Macro; Take 1 capsule (100 mg total) by mouth 2 (two) times daily for 5 days.  Dispense: 10 capsule; Refill: 0     Follow-up: Return in about 3 months (around 05/06/2023).  Sanda Linger, MD

## 2023-02-06 ENCOUNTER — Other Ambulatory Visit: Payer: Self-pay | Admitting: Internal Medicine

## 2023-02-06 ENCOUNTER — Encounter: Payer: Self-pay | Admitting: Internal Medicine

## 2023-02-06 ENCOUNTER — Ambulatory Visit (INDEPENDENT_AMBULATORY_CARE_PROVIDER_SITE_OTHER): Payer: Medicare Other

## 2023-02-06 DIAGNOSIS — I10 Essential (primary) hypertension: Secondary | ICD-10-CM | POA: Diagnosis not present

## 2023-02-06 DIAGNOSIS — E785 Hyperlipidemia, unspecified: Secondary | ICD-10-CM | POA: Diagnosis not present

## 2023-02-06 LAB — HEPATIC FUNCTION PANEL
ALT: 25 U/L (ref 0–35)
AST: 22 U/L (ref 0–37)
Albumin: 4.5 g/dL (ref 3.5–5.2)
Alkaline Phosphatase: 60 U/L (ref 39–117)
Bilirubin, Direct: 0.2 mg/dL (ref 0.0–0.3)
Total Bilirubin: 1.3 mg/dL — ABNORMAL HIGH (ref 0.2–1.2)
Total Protein: 7 g/dL (ref 6.0–8.3)

## 2023-02-07 ENCOUNTER — Encounter: Payer: Self-pay | Admitting: Internal Medicine

## 2023-02-10 LAB — LIPOPROTEIN A (LPA): Lipoprotein (a): 187 nmol/L — ABNORMAL HIGH (ref <75)

## 2023-02-13 ENCOUNTER — Telehealth: Payer: Self-pay | Admitting: Cardiovascular Disease

## 2023-02-13 DIAGNOSIS — I10 Essential (primary) hypertension: Secondary | ICD-10-CM

## 2023-02-13 DIAGNOSIS — I2510 Atherosclerotic heart disease of native coronary artery without angina pectoris: Secondary | ICD-10-CM

## 2023-02-13 DIAGNOSIS — M349 Systemic sclerosis, unspecified: Secondary | ICD-10-CM

## 2023-02-13 DIAGNOSIS — Z8673 Personal history of transient ischemic attack (TIA), and cerebral infarction without residual deficits: Secondary | ICD-10-CM

## 2023-02-13 DIAGNOSIS — E782 Mixed hyperlipidemia: Secondary | ICD-10-CM

## 2023-02-13 MED ORDER — ICOSAPENT ETHYL 1 G PO CAPS: 1.0000 g | ORAL_CAPSULE | ORAL | 3 refills | Status: DC

## 2023-02-13 MED ORDER — VASCEPA 1 G PO CAPS: 1.0000 g | ORAL_CAPSULE | Freq: Every morning | ORAL | 3 refills | Status: DC

## 2023-02-13 NOTE — Telephone Encounter (Signed)
Pt is requesting name Vascepa. Would Dr. Tresa Endo like to reorder this medication as name brand? Please address

## 2023-02-13 NOTE — Telephone Encounter (Signed)
Spoke to patient she stated Dr.Kelly increased Vascepa to 1 gram every day.Stated she wants brand name only.Prescription sent to her pharmacy.

## 2023-02-13 NOTE — Addendum Note (Signed)
Addended by: Neoma Laming on: 02/13/2023 05:14 PM   Modules accepted: Orders

## 2023-02-13 NOTE — Telephone Encounter (Signed)
Pt's medication was sent to pt's pharmacy as requested. Confirmation received.  °

## 2023-02-13 NOTE — Telephone Encounter (Signed)
*  STAT* If patient is at the pharmacy, call can be transferred to refill team.   1. Which medications need to be refilled? (please list name of each medication and dose if known) icosapent Ethyl (VASCEPA) 1 g capsule     4. Which pharmacy/location (including street and city if local pharmacy) is medication to be sent to? [id number] - [place], [place] - [address] AT [facility] [address]     5. Do they need a 30 day or 90 day supply? 90

## 2023-02-13 NOTE — Telephone Encounter (Signed)
Patient is calling back to get the brand name Vascepa called in and for 30 days. She does not want the generic version. She also states she requesting 90, but will take 30 day supply.

## 2023-02-16 ENCOUNTER — Encounter: Payer: Self-pay | Admitting: Internal Medicine

## 2023-02-16 ENCOUNTER — Encounter: Payer: Self-pay | Admitting: Cardiovascular Disease

## 2023-02-17 ENCOUNTER — Other Ambulatory Visit: Payer: Self-pay | Admitting: Internal Medicine

## 2023-02-17 DIAGNOSIS — N3000 Acute cystitis without hematuria: Secondary | ICD-10-CM

## 2023-02-20 ENCOUNTER — Other Ambulatory Visit (HOSPITAL_COMMUNITY): Payer: Self-pay

## 2023-02-20 ENCOUNTER — Telehealth: Payer: Self-pay

## 2023-02-20 ENCOUNTER — Other Ambulatory Visit (INDEPENDENT_AMBULATORY_CARE_PROVIDER_SITE_OTHER): Payer: Medicare Other

## 2023-02-20 DIAGNOSIS — N3000 Acute cystitis without hematuria: Secondary | ICD-10-CM

## 2023-02-20 LAB — URINALYSIS, ROUTINE W REFLEX MICROSCOPIC
Bilirubin Urine: NEGATIVE
Hgb urine dipstick: NEGATIVE
Ketones, ur: NEGATIVE
Leukocytes,Ua: NEGATIVE
Nitrite: POSITIVE — AB
Specific Gravity, Urine: 1.02 (ref 1.000–1.030)
Total Protein, Urine: NEGATIVE
Urine Glucose: NEGATIVE
Urobilinogen, UA: 1 (ref 0.0–1.0)
pH: 7 (ref 5.0–8.0)

## 2023-02-20 NOTE — Telephone Encounter (Signed)
 Pharmacy Patient Advocate Encounter   Received notification from CoverMyMeds that prior authorization for VASCEPA  is required/requested.   Insurance verification completed.   The patient is insured through [employer] .   Per test claim: PA required; PA submitted to above mentioned insurance via CoverMyMeds Key/confirmation #/EOC [id number] Status is pending

## 2023-02-20 NOTE — Telephone Encounter (Signed)
 Pharmacy Patient Advocate Encounter  Received notification from [employer] that Prior Authorization for VASCEPA  has been APPROVED from 01/21/23 to 02/20/24. Ran test claim, Copay is $78.39. This test claim was processed through [facility]- copay amounts may vary at other pharmacies due to pharmacy/plan contracts, or as the patient moves through the different stages of their insurance plan.

## 2023-02-21 ENCOUNTER — Other Ambulatory Visit: Payer: Self-pay | Admitting: Internal Medicine

## 2023-02-21 DIAGNOSIS — N3000 Acute cystitis without hematuria: Secondary | ICD-10-CM

## 2023-02-22 ENCOUNTER — Other Ambulatory Visit: Payer: Self-pay | Admitting: Internal Medicine

## 2023-02-22 DIAGNOSIS — B961 Klebsiella pneumoniae [K. pneumoniae] as the cause of diseases classified elsewhere: Secondary | ICD-10-CM | POA: Insufficient documentation

## 2023-02-22 LAB — CULTURE, URINE COMPREHENSIVE

## 2023-02-22 MED ORDER — CEFDINIR 300 MG PO CAPS: 300.0000 mg | ORAL_CAPSULE | Freq: Two times a day (BID) | ORAL | 0 refills | Status: AC

## 2023-03-03 ENCOUNTER — Encounter: Payer: Self-pay | Admitting: Internal Medicine

## 2023-03-03 ENCOUNTER — Other Ambulatory Visit: Payer: Self-pay | Admitting: Cardiovascular Disease

## 2023-03-03 ENCOUNTER — Ambulatory Visit: Payer: Medicare Other | Admitting: Internal Medicine

## 2023-03-03 VITALS — BP 129/83 | HR 75 | Ht 66.0 in | Wt 187.8 lb

## 2023-03-03 DIAGNOSIS — J849 Interstitial pulmonary disease, unspecified: Secondary | ICD-10-CM

## 2023-03-03 DIAGNOSIS — M359 Systemic involvement of connective tissue, unspecified: Secondary | ICD-10-CM

## 2023-03-03 DIAGNOSIS — J8489 Other specified interstitial pulmonary diseases: Secondary | ICD-10-CM | POA: Diagnosis not present

## 2023-03-03 DIAGNOSIS — Z01811 Encounter for preprocedural respiratory examination: Secondary | ICD-10-CM

## 2023-03-03 DIAGNOSIS — M349 Systemic sclerosis, unspecified: Secondary | ICD-10-CM | POA: Diagnosis not present

## 2023-03-03 NOTE — Progress Notes (Signed)
 IOV 12/10/2015 - history some the patient and review of the outside records from Dr. Jannice in cardiology office notes 11/19/2015 Dr. Cherrie  Chief Complaint  Patient presents with   Pulmonary Consult    Referred by Dr Dolphus- Scleroderma - Had HRCT 11/22/15- SOB with walking up driveway or up steps - Denies cough or wheezing    78 year old female was diagnosed with scleroderma in 2008 when she was 78 years old. Notice many problems with the skin in her fingers and the form of a sclerodactyly. I According to her history and referral notes she did have a biopsy with Dr. Rolan Molt that confirmed the scleroderma diagnosis     In 2008 she had dyspnea. She had a cardiac stress test at that time she had a Myoview for stress ejection fraction was 81% but resting echo showe d ejection fraction of 45%. She does not remember this but this is per cardiology notes. He tells me now that she's had insidious onset of dyspnea for the last few years. It has been steadily progressive. It is moderate in intensity. She notices this when she climbs stairs or goes to [employer]. It is relieved by rest. It is not associated with any chest pain. This no associated cough or edema or orthopnea paroxysmal nocturnal dyspnea presyncope. According to the cardiology notes she snores a lot.  She had echocardiogram this fall 2017 document below that shows grade 1 diastolic dysfunction with good left ventricular ejection fraction. Right ventricular pump function is normal. No comment on pulmonary systolic pressures.  She underwent pulmonary function test that shows isolated reduction in diffusion capacity documented below. She then underwent high-resolution CT chest that I personally visualized shows some faint groundglass opacities. Therefore she has been referred here.  She tells me that for her scleroderma she's been treated with methotrexate in The past. She Does Have the Details. She Says This Did Not Help  Her. Overall She Is a Nonsmoker. She Drinks Alcohol Socially. She Does Walking for Exercise but Does Have Right Knee Joint Discomfort. Rheumatology Notes Indicate Significant Issues with Osteoarthritis Especially of the Right Knee Is Documented in Office Visits in November 2016 and Subsequently.Also arthritis is also documented in her hand joints   Tests below  Pulmonary function test 11/09/2015 FVC 2.4 L/1%, FEV1 2.6 L/104%. DLCO 5.3/95% and DLCO 17.03/60%. She has isolated reduction in diffusion capacity  IMPRESSION: HRCT 11/22/15 1. Mild patchy subpleural ground-glass attenuation and reticulation in both lungs, most prominent in the lower lobes. No significant traction bronchiectasis. No frank honeycombing. 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 A follow-up high-resolution chest CT study in 12 months would be useful to assess temporal pattern stability. 2. Additional findings include aortic atherosclerosis, three-vessel coronary atherosclerosis and possible 0.9 cm lower left thyroid  lobe nodule.     Electronically Signed   By: Selinda [name] Blue M.D.   On: 11/22/2015 13:14  Walking desaturation test 185 feet 3 laps on room air today in the office 12/10/2015: Resting pulse ox was 94% after 3 laps pulse ox was 98%. She had some pain with her right knee where she had no dyspnea. Resting heart rate was 87/m [place] to 93/m  Echocardiogram 11/19/2015 shows grade 1 diastolic dysfunction but otherwise reported as normal. Including right ventricle    OV 10/09/2016  Chief Complaint  Patient presents with     Follow-up    Pt states she feels she is not  able to breathe in enough air. Pt denies significant cough. Pt states she has chest tightness at random times. Pt denies f/c/s.    Follow-up scleroderma with ILD associated with mild reduction in diffusion capacity - mild severity of disease on observation therapy  Last seen  October 2017. Since then she's been on observation therapy. She had pulmonary function testing in June 2018 that in the last 2 years is showing mild decline in diffusion capacity. She tells me that correlating with this she is feeling more shortness of breath with exertion and even at rest she feels that she has to take a deep breath. There is mild associated cough occasionally but there is no wheeze. There is no fever or edema. Otherwise feeling well. She has some questions about getting pneumonia vaccine. Apparently she's getting an alert about this. Review of the chart shows that she is up-to-date with both Pneumovax and Prevnar within 5 years  Walking desaturation test 185 feet 3 laps on room air: Walking desaturation test on 10/09/2016 185 feet x 3 laps on RA :  did NOT  desaturate. Rest pulse ox was 98%, final pulse ox was 98%. HR response 83/min at rest to 98/min at peak exertion.    OV 12/17/2016  Chief Complaint  Patient presents with   Follow-up    pft done today and HRCT done 10/23. 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 This continues unchanged. Within the staging workup high-resolution CT scan of the chest shows mild subtle interstitial lung disease that is unchanged compared to one year ago. However on pulmonary function tests this seems to be a trend was decline in both FVC and DLCO. If there is worsening interstitial lung disease this is not reflected on the CT scan [as yet] we also look for pulmonary hypertension with echocardiogram which she had October 2018 shows grade 1 diastolic dysfunctionbut no documentation of pulmonary hypertension. She has never been to pulmonary rehabilitation because of her knee issues she is a bit deconditioned. She is interested in pulmonary rehabilitation but she wants to get this  done in [place]  where she lives between January and May 2019    IMPRESSION: compared to oct 2017 1. 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 2. Aortic atherosclerosis, in addition to left main and 3 vessel coronary artery disease. Please note that although the presence of coronary artery calcium  documents the presence of coronary artery disease, the severity of this disease and any potential stenosis cannot be assessed on this non-gated CT examination. Assessment for potential risk factor modification, dietary therapy or pharmacologic therapy may be warranted, if clinically indicated.   Aortic Atherosclerosis (ICD10-I70.0).     Electronically Signed   By: Toribio Aye M.D.   On: 12/09/2016 12:07   OV 12/16/2017  Subjective:  Patient ID: Savannah [name] Hammersmith, female , DOB: June 12, 1945 , age 67 y.o. , MRN: [id number] , [address]: [address] [place] [id number]   12/16/2017 -   Chief Complaint  Patient presents with   Follow-up    shortness of breath     HPI SAPHRONIA OZDEMIR 78 y.o. 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  Stable over time.  She returns for follow-up.  She spent the time  from January through September in [place] .  She was supposed to return in July but her return got delayed.  Therefore she is here for this follow-up visit in October 2019.  And once again in January 2020 all the way through July 2020 she will be back in [place]  in [place].  At this point in time she is continues to do well without any worsening shortness of breath.  She can do activities of daily living and climb hills and stairs without a problem.  Although she does feel that the edema in her fingers is slowly getting worse particularly in the right hand.  There is no chest pain or any other side effects.  She had pulmonary function test today  that shows continued stability over 3-1/2 years.      OV 01/17/2019  Subjective:  Patient ID: Savannah [name] Hammersmith, female , DOB: 02-Jan-1946 , age 9 y.o. , MRN: [id number] , AD[address]: [address] [place] [id number]   01/17/2019 -   Chief Complaint  Patient presents with   Follow-up    Pt states she has been doing good since last visit. Pt denies any complaints of any cough and states SOB is about the same since last visit.   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  HPI KATIYA FIKE 78 y.o. -she returns for 1 year follow-up.  She says overall she is stable in terms of her shortness of breath.  She had a high-resolution CT chest which is a 2-year follow-up.  Reported as stable.  Her symptom scores are listed below.  She did see cardiology Dr. Cherrie who has been reassured with her echocardiogram.  She does have coronary artery calcification he is aware of this.  She did have pulmonary function test today and her DLCO seems fluctuant but stable and a slight decline in her FVC.  She is surprised by this because overall she feels stable.  She is not had a flu shot and she defers a flu shot every year.  She is asking about the Covid vaccine.  She is social distancing and does not spend her time with any human clusyer r of more than 2 people over the holiday weekend.   OV 01/25/2020   Subjective:  Patient ID: Savannah [name] Hammersmith, female , DOB: April 10, 1945, age 53 y.o. years. , MRN: [id number],  ADDRESS: [address] [place] [id number] PCP  Joshua Debby CROME, MD Providers : Treatment Team:  Attending Provider: Geronimo Amel, MD Patient Care Team: Joshua Debby CROME, MD as PCP - General (Internal Medicine)    Chief Complaint  Patient presents with   Follow-up    PFT performed 11/30.  Pt states she has been doing good since last visit and denies any complaints.     Follow-up mild interstitial lung disease in the setting of  scleroderma: On observation therapy  HPI ZAN TRISKA 78 y.o. -last seen in November 2020.  After that she was supposed to come in the summer 2021.  However she lives part-time in [place]  husband undergoes physical therapy.  They have a condominium there.  Therefore she has not been able to attend.  From a symptom standpoint she says she is stable.  She wants to the point rehabilitation in [place].  She wants a referral.  She has a rheumatology appointment tomorrow.  She saw Dr. Cherrie cardiology yesterday.  Echocardiogram reviewed did not show any pulmonary hypertension.  2-year follow-up has been  given for her.  She is very active.  Current symptom scores are listed below.  She did pulmonary function test.  This is documented below in my view it is showing steady slow decline in both diffusion and capacity.  However symptomatically she does not feel this.  But looking at the ILD symptom score below symptoms seem progressive based on numbers.    03/05/2021 -   Chief Complaint  Patient presents with   Follow-up    Pt is following up after recent CT and echo.  Pt states she has been doing okay since last visit.   Scleroderma with mild ILD.  Slow progression over time.  HPI DALI KRANER 78 y.o. -here for follow-up to discuss the test results.  She continues to do well.  She had echocardiogram that just shows grade 1 diastolic dysfunction.  No evidence of pulmonary hypertension.  She had high-resolution CT chest December 2022.  Compared to December 2021 no progression.  She also had pulmonary function test that shows a 2.5% decline in FVC [not statistically significant] in 2 years but stability in the last 1 year.  As she feels stable.  We discussed different options.  Versus expectant follow-up but did indicate to her that the field of interstitial lung disease particularly non-- IPF is changing and given slow progression over time we do need to keep a closer eye because  antifibrotic's would be indicated if there is more significant progression.  She understands this.  She lives half the year in [place]  in [place].  She will be back in the summer.  She did express to her about the concept of clinical trials as a care option particularly with red disease scleroderma.  She is interested if she is in [place] and not traveling.    OV 03/03/2023  Subjective:  Patient ID: Savannah [name] Hammersmith, female , DOB: 1945-03-27 , age 89 y.o. , MRN: [id number] , [address]: [address] [place] [id number] PCP Joshua Debby CROME, MD Patient Care Team: Joshua Debby CROME, MD as PCP - General (Internal Medicine) Burnard Debby DELENA, MD as PCP - Cardiology (Cardiology)  This Provider for this visit: Treatment Team:  Attending Provider: Geronimo Amel, MD    03/03/2023 -   Chief Complaint  Patient presents with   Follow-up    F/u pft pt also needs surgical clearance for hip surgery    Scleroderma with mild ILD extremely slow progression on supportive care.  HPI GALI SPINNEY 78 y.o. -returns for 1 year follow-up.  She has got significant worsening of her symptoms but she attributes all of this to worsening left hip arthritis.  In November 2024 she ended up in the ER at [facility].  Apparently hip arthritis is pretty bad she saw orthopedic surgeon there she is going to have left hip arthroplasty..  Her daughter lives there and she is going to have the hip surgery there.  She herself lives in [place].  She did have pulmonary function test the FVC is slightly declined but it still over 100% the DLCO itself is stable for the last 4 years.  This only progression compared to 5 years ago on a consistent manner.  Exercise hypoxemia test shows no desaturation suggesting if there is progression of the ILD is extremely mild.    She wants me to clear her for surgery for her left hip.  This evaluation was done today.  SYMPTOM SCALE - ILD 01/17/2019  01/25/2020  01/23/2021   03/05/2021  O2 use ra ra ra RA 0 WORSEING HHIP PAIN  Shortness of Breath 0 -> 5 scale with 5 being worst (score 6 If unable to do)     At rest 0 1 0 3  Simple tasks - showers, clothes change, eating, shaving 0 1 1 4   Household (dishes, doing bed, laundry) 2 3 1 3   Shopping 2 2 1 5   Walking level at own pace 2 2 2 3   Walking up Stairs 3 3 2 5   Total (40 - 48) Dyspnea Score 9 12 7 23   How bad is your cough? 0  1 0 1  How bad is your fatigue 2  2 1 5   Nausea   1 0 2  Vomiting   0 0 0  Diarrhea   0 00 0  Anxiety   1 0 3  Depression   2 0 3      Simple office walk 185 feet x  3 laps goal with forehead probe 12/16/2017  01/17/2019  01/23/2021   03/03/2023   O2 used Room air Room air ra ra  Number laps completed 3 3 3  Sit stand stang 15  Comments about pace normal avg pace    Resting Pulse Ox/HR 98% and 70/min 100% and 82/min 98% and HR 74 98% with heart rate 79  Final Pulse Ox/HR 99% and 90/min 98% and 99/min 95% and HR 92 98% with heart rate 80  Desaturated </= 88% no no no   Desaturated <= 3% points no no Yes 3 poitns No desaturations  Got Tachycardic >/= 90/min yes yes yes   Symptoms at end of test x nomne Mild chest tightness   Miscellaneous comments x x      1) RISK FOR PROLONGED MECHANICAL VENTILAION - > 48h  1A) Arozullah - Prolonged mech ventilation risk Arozullah Postperative Pulmonary Risk Score - for mech ventilation dependence >48h Usaa, Ann Surg 2000, major non-cardiac surgery) Comment Score  Type of surgery - abd ao aneurysm (27), thoracic (21), neurosurgery / upper abdominal / vascular (21), neck (11) Hip surgery 0  Emergency Surgery - (11) elective 0  ALbumin < 3 or poor nutritional state - (9) 4.5 I dec 2024 0  BUN > 30 -  (8) 21  0  Partial or completely dependent functional status - (7) I adl 0  COPD -  (6) Mil dILD 6  Age - 60 to 82 (4), > 70  (6) E 78 6  TOTAL  12  Risk Stratifcation scores  - < 10 (0.5%), 11-19 (1.8%), 20-27 (4.2%), 28-40  (10.1%), >40 (26.6%)  LOW MODERATE        PFT     Latest Ref Rng & Units 01/27/2023    2:05 PM 01/23/2021    2:55 PM 01/17/2020    2:37 PM 12/29/2018    2:41 PM 12/16/2017    9:05 AM 12/17/2016   10:00 AM 08/04/2016   10:57 AM  ILD indicators  FVC-Pre L 2.92  3.07  3.02  3.15  3.21  3.21  3.24   FVC-Predicted Pre % 101  100  97  100  98  97  98   FVC-Post L    3.15    3.03   FVC-Predicted Post %    100    91   TLC L    5.05    5.11   TLC Predicted %    93    92   DLCO uncorrected ml/min/mmHg 15.62  15.68  15.75  16.54  18.01  16.59  16.89   DLCO UNC %Pred % 78  76  76  79  63  58  59   DLCO Corrected ml/min/mmHg 15.62  15.68  15.52    17.02  16.84   DLCO COR %Pred % 78  76  75    60  59           has a past medical history of Dyspnea, Hyperlipidemia, Hypertension, Osteoarthritis, Swelling of extremity, and TIA (transient ischemic attack).   reports that she has never smoked. She has never used smokeless tobacco.  Past Surgical History:  Procedure Laterality Date   TONSILLECTOMY      Allergies  Allergen Reactions   Codeine Nausea Only    Other reaction(s): Unknown   Cymbalta [Duloxetine Hcl] Hives   Lyrica [Pregabalin] Hives   Morphine  Nausea And Vomiting   Penicillins    Sulfa Antibiotics Hives   Zocor [Simvastatin] Other (See Comments)    Myalgia    Immunization History  Administered Date(s) Administered   Fluad Quad(high Dose 65+) 01/24/2019   Influenza, High Dose Seasonal PF 10/17/2019, 12/18/2020   PFIZER(Purple Top)SARS-COV-2 Vaccination 03/13/2019, 04/05/2019, 01/24/2020   Pneumococcal Conjugate-13 11/20/2014   Pneumococcal Polysaccharide-23 11/17/2013, 01/16/2021   Tdap 07/29/2016   Zoster, Live 12/09/2009    Family History  Problem Relation Age of Onset   Hyperlipidemia Father    Cirrhosis Father 39   Kidney failure Father 63   Stroke Maternal Grandmother 85   Hypertension Maternal Grandmother 40   Angina Maternal Grandfather 50    Heart attack Maternal Grandfather 38   Lung disease Paternal Grandmother    Atrial fibrillation Mother 71   Hypertension Mother      Current Outpatient Medications:    aspirin 81 MG tablet, Take 81 mg by mouth., Disp: , Rfl:    b complex vitamins capsule, Take 1 capsule by mouth 2 (two) times a week., Disp: , Rfl:    Calcium  Carbonate (CALCIUM  600 PO), Take 600 mg by mouth daily., Disp: , Rfl:    Cholecalciferol (D3 MAXIMUM STRENGTH PO), Take 5,000 Units by mouth 3 (three) times a week., Disp: , Rfl:    Coenzyme Q10 200 MG capsule, Take 200 mg by mouth daily., Disp: , Rfl:    diclofenac (VOLTAREN) 0.1 % ophthalmic solution, Place 1 drop into the left eye 2 (two) times daily., Disp: , Rfl:    exemestane (AROMASIN) 25 MG tablet, Take 25 mg by mouth daily after breakfast., Disp: , Rfl:    ezetimibe  (ZETIA ) 10 MG tablet, Take 10 mg by mouth daily., Disp: , Rfl:    furosemide  (LASIX ) 20 MG tablet, TAKE 20 MG( 1/2 TABLET ) TO 40 MG ( 1 TABLET) AS NEEDED FOR FLUID OR EDEMA, Disp: 180 tablet, Rfl: 1   HYDROcodone-acetaminophen (NORCO/VICODIN) 5-325 MG tablet, Take 1 tablet by mouth every 6 (six) hours as needed for moderate pain (pain score 4-6) or severe pain (pain score 7-10)., Disp: , Rfl:    loratadine (CLARITIN) 10 MG tablet, Take 10 mg by mouth daily as needed for allergies., Disp: , Rfl:    Magnesium 300 MG CAPS, Take 300 mg by mouth daily., Disp: , Rfl:    MAGNESIUM CL-CALCIUM  CARBONATE PO, Take 400 mg by mouth daily., Disp: , Rfl:    Menaquinone-7 (VITAMIN K2 PO), Take 50 mcg by mouth 3 (three) times a week., Disp: , Rfl:    metoprolol  succinate (TOPROL -XL) 50 MG 24 hr  tablet, TAKE 1 TABLET BY MOUTH EVERY DAY. TAKE WITH OR IMMEDIATELY FOLLOWING A MEAL., Disp: 90 tablet, Rfl: 3   olmesartan -hydrochlorothiazide (BENICAR  HCT) 40-12.5 MG tablet, Take 1 tablet by mouth daily., Disp: 90 tablet, Rfl: 3   Pitavastatin  Calcium  2 MG TABS, TAKE 1 TABLET BY MOUTH TWICE WEEKLY (Patient taking  differently: Take 2 mg by mouth daily. TAKE 1 TABLET BY MOUTH TWICE WEEKLY), Disp: 26 tablet, Rfl: 13   potassium chloride  SA (KLOR-CON  M20) 20 MEQ tablet, Take 1 tablet (20 mEq total) by mouth daily as needed. When you take Furosemide  (Lasix )., Disp: 90 tablet, Rfl: 3   Suvorexant  (BELSOMRA ) 15 MG TABS, Take 1 tablet (15 mg total) by mouth at bedtime as needed., Disp: 90 tablet, Rfl: 0   valACYclovir  (VALTREX ) 1000 MG tablet, Take 1 tablet (1,000 mg total) by mouth 2 (two) times daily. (Patient taking differently: Take 1,000 mg by mouth as needed.), Disp: 180 tablet, Rfl: 1   VASCEPA  1 g capsule, Take 1 capsule (1 g total) by mouth every morning., Disp: 90 capsule, Rfl: 3   vitamin E 180 MG (400 UNITS) capsule, Take 400 Units by mouth daily., Disp: , Rfl:       Objective:   Vitals:   03/03/23 0823  BP: 129/83  Pulse: 75  SpO2: 98%  Weight: 187 lb 12.8 oz (85.2 kg)  Height: 5' 6 (1.676 m)    Estimated body mass index is 30.31 kg/m as calculated from the following:   Height as of this encounter: 5' 6 (1.676 m).   Weight as of this encounter: 187 lb 12.8 oz (85.2 kg).  @WEIGHTCHANGE @  [employer]   03/03/23 0823  Weight: 187 lb 12.8 oz (85.2 kg)     Physical Exam   General: No distress. LOOKS WELL BUT WALKS WITH CANE AND PAIN O2 at rest: NO Cane present: yes Sitting in wheel chair: NO Frail: NO Obese: yes Neuro: Alert and Oriented x 3. GCS 15. Speech normal Psych: Pleasant Resp:  Barrel Chest - NO.  Wheeze - NO, Crackles - NO, No overt respiratory distress CVS: Normal heart sounds. Murmurs - NO Ext: Stigmata of Connective Tissue Disease - NO HEENT: Normal upper airway. PEERL +. No post nasal drip        Assessment:       ICD-10-CM   1. ILD (interstitial lung disease) (HCC)  J84.9     2. Scleroderma (HCC)  M34.9     3. 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 Preoperative respiratory examination  Z01.811       I will itself is stable but I think given some changes in FVC it is probably best to see her in 6 months especially with upcoming hip surgery with both repeat spirometry and DLCO  In terms of hip surgery she is at low-moderate complications for prolonged mechanical ventilation otherwise routine elevated risk for postoperative atelectasis pneumonia hypoxemia and PE.  This can be ameliorated by aggressive mobilization and DVT/PE prevention protocols established by orthopedics.    Plan:     Patient Instructions     ICD-10-CM   1. Interstitial lung disease due to connective tissue disease (HCC)  J84.89    M35.9   2. Scleroderma (HCC)  M34.9    Symptoms worse 2022  -> 2025 - probbly due to left hip pain causing shortness of breath PFt sable x2021 -> 2024 but swors since 2018/2019  Plan  - spirometry and dlco in 6- months - HRCT in 6 months - continue expectant approach   Peroperative REspiatior Exam prior to Left Hip Surgery  Low Moderate risk for prolonged pulmonary compilication  Plan  -early mobilization and DVT/PE prevention per Orthopedic prtocool  Followup  -68 months - 15  min visit but after PFT and CT   - symptom score and walk test at followup   FOLLOWUP Return in about 7 months (around 10/01/2023) for 15 min visit, after Spiro and DLCO, with Dr Geronimo, Face to Face OR Video Visit.    SIGNATURE    Dr. Dorethia Geronimo, M.D., F.C.C.P,  Pulmonary and Critical Care Medicine Staff Physician, [facility] Director - Interstitial Lung Disease  Program  Pulmonary Fibrosis [facility] [place], [place], [address]  Pager: [phone], If no answer or between  15:00h - 7:00h: call [phone] Telephone: [phone]  8:50 AM 03/03/2023

## 2023-03-03 NOTE — Patient Instructions (Addendum)
 ICD-10-CM   1. Interstitial lung disease due to connective tissue disease (HCC)  J84.89    M35.9   2. Scleroderma (HCC)  M34.9    Symptoms worse 2022  -> 2025 - probbly due to left hip pain causing shortness of breath PFt sable x2021 -> 2024 but swors since 2018/2019   Plan  - spirometry and dlco in 6- months - HRCT in 6 months - continue expectant approach   Peroperative REspiatior Exam prior to Left Hip Surgery  Low Moderate risk for prolonged pulmonary compilication  Plan  -early mobilization and DVT/PE prevention per Orthopedic prtocool  Followup  -68 months - 15  min visit but after PFT and CT   - symptom score and walk test at followup

## 2023-04-09 ENCOUNTER — Encounter: Payer: Self-pay | Admitting: Cardiovascular Disease

## 2023-05-14 ENCOUNTER — Other Ambulatory Visit: Payer: Self-pay

## 2023-05-14 ENCOUNTER — Encounter: Payer: Self-pay | Admitting: Cardiovascular Disease

## 2023-05-14 MED ORDER — PITAVASTATIN CALCIUM 2 MG PO TABS: 2.0000 mg | ORAL_TABLET | Freq: Every day | ORAL | 3 refills | Status: DC

## 2023-05-14 MED ORDER — PITAVASTATIN CALCIUM 2 MG PO TABS: ORAL_TABLET | ORAL | 2 refills | Status: DC

## 2023-05-19 ENCOUNTER — Ambulatory Visit (INDEPENDENT_AMBULATORY_CARE_PROVIDER_SITE_OTHER)

## 2023-05-19 VITALS — Ht 66.0 in | Wt 170.0 lb

## 2023-05-19 DIAGNOSIS — Z0000 Encounter for general adult medical examination without abnormal findings: Secondary | ICD-10-CM

## 2023-05-19 NOTE — Progress Notes (Signed)
 Subjective:   Savannah [name] is a 78 y.o. who presents for a Medicare Wellness preventive visit.  Visit Complete: Virtual I connected with  Ponciano Ort on 05/19/23 by a audio enabled telemedicine application and verified that I am speaking with the correct person using two identifiers.  Patient Location: Home  Provider Location: Office/Clinic  I discussed the limitations of evaluation and management by telemedicine. The patient expressed understanding and agreed to proceed.  Vital Signs: Because this visit was a virtual/telehealth visit, some criteria may be missing or patient reported. Any vitals not documented were not able to be obtained and vitals that have been documented are patient reported.  VideoDeclined- This patient declined Librarian, academic. Therefore the visit was completed with audio only.  Persons Participating in Visit: Patient.  AWV Questionnaire: No: Patient Medicare AWV questionnaire was not completed prior to this visit.  Cardiac Risk Factors include: advanced age (>59men, >35 women);hypertension;dyslipidemia     Objective:    Today's Vitals   05/19/23 1137  Weight: 170 lb (77.1 kg)  Height: 5\' 6"  (1.676 m)   Body mass index is 27.44 kg/m.     05/19/2023   11:33 AM 12/07/2018   11:46 AM  Advanced Directives  Does Patient Have a Medical Advance Directive? Yes Yes  Type of Estate agent of [place];Living will Living will;Healthcare Power of [place];Out of facility DNR (pink MOST or yellow form)  Copy of Healthcare Power of Attorney in Chart? No - copy requested     Current Medications (verified) Outpatient Encounter Medications as of 05/19/2023  Medication Sig   aspirin 81 MG tablet Take 81 mg by mouth.   b complex vitamins capsule Take 1 capsule by mouth 2 (two) times a week.   Calcium Carbonate (CALCIUM 600 PO) Take 600 mg by mouth daily.   Cholecalciferol (D3 MAXIMUM STRENGTH PO) Take 5,000 Units by  mouth 3 (three) times a week.   Coenzyme Q10 200 MG capsule Take 200 mg by mouth daily.   diclofenac (VOLTAREN) 0.1 % ophthalmic solution Place 1 drop into the left eye 2 (two) times daily.   exemestane (AROMASIN) 25 MG tablet Take 25 mg by mouth daily after breakfast.   ezetimibe (ZETIA) 10 MG tablet Take 10 mg by mouth daily.   furosemide (LASIX) 20 MG tablet TAKE 20 MG( 1/2 TABLET ) TO 40 MG ( 1 TABLET) AS NEEDED FOR FLUID OR EDEMA   loratadine (CLARITIN) 10 MG tablet Take 10 mg by mouth daily as needed for allergies.   Magnesium 300 MG CAPS Take 300 mg by mouth daily.   MAGNESIUM CL-CALCIUM CARBONATE PO Take 400 mg by mouth daily.   Menaquinone-7 (VITAMIN K2 PO) Take 50 mcg by mouth 3 (three) times a week.   metoprolol succinate (TOPROL-XL) 50 MG 24 hr tablet TAKE 1 TABLET BY MOUTH EVERY DAY. TAKE WITH OR IMMEDIATELY FOLLOWING A MEAL.   olmesartan-hydrochlorothiazide (BENICAR HCT) 40-12.5 MG tablet Take 1 tablet by mouth daily.   Pitavastatin Calcium 2 MG TABS Take 1 tablet (2 mg total) by mouth daily.   potassium chloride SA (KLOR-CON M20) 20 MEQ tablet Take 1 tablet (20 mEq total) by mouth daily as needed. When you take Furosemide (Lasix).   valACYclovir (VALTREX) 1000 MG tablet Take 1 tablet (1,000 mg total) by mouth 2 (two) times daily. (Patient taking differently: Take 1,000 mg by mouth as needed.)   VASCEPA 1 g capsule Take 1 capsule (1 g total) by mouth  every morning.   vitamin E 180 MG (400 UNITS) capsule Take 400 Units by mouth daily.   [DISCONTINUED] HYDROcodone-acetaminophen (NORCO/VICODIN) 5-325 MG tablet Take 1 tablet by mouth every 6 (six) hours as needed for moderate pain (pain score 4-6) or severe pain (pain score 7-10).   [DISCONTINUED] Pitavastatin Calcium 2 MG TABS TAKE 1 TABLET BY MOUTH TWICE WEEKLY   [DISCONTINUED] Suvorexant (BELSOMRA) 15 MG TABS Take 1 tablet (15 mg total) by mouth at bedtime as needed.   No facility-administered encounter medications on file as of  05/19/2023.    Allergies (verified) Celecoxib, Codeine, Cymbalta [duloxetine hcl], Lyrica [pregabalin], Morphine, Penicillins, Sulfa antibiotics, and Zocor [simvastatin]   History: Past Medical History:  Diagnosis Date   Dyspnea    MYOVIEW, 12/29/2006 - post-stress EF 81%,no ECG changes, EKG negative for ischemia   Hyperlipidemia    Hypertension    2D ECHO, 12/24/2006 - EF 45-55%, normal   Osteoarthritis    Swelling of extremity    LEA VENOUS DUPLEX, 08/19/2011 - deep valvular insufficiency noted with right femoral and popliteal veins and left common femoral vein   TIA (transient ischemic attack)    CAROTID DOPPLER, 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   Past Surgical History:  Procedure Laterality Date   TONSILLECTOMY     TOTAL HIP ARTHROPLASTY Left    Dr Megan Mans - surgeon   Family History  Problem Relation Age of Onset   Hyperlipidemia Father    Cirrhosis Father 69   Kidney failure Father 98   Stroke Maternal Grandmother 15   Hypertension Maternal Grandmother 40   Angina Maternal Grandfather 50   Heart attack Maternal Grandfather 18   Lung disease Paternal Grandmother    Atrial fibrillation Mother 4   Hypertension Mother    Social History   Socioeconomic History   Marital status: Widowed    Spouse name: Not on file   Number of children: Not on file   Years of education: Not on file   Highest education level: Some college, no degree  Occupational History   Not on file  Tobacco Use   Smoking status: Never    Passive exposure: Never   Smokeless tobacco: Never  Vaping Use   Vaping status: Never Used  Substance and Sexual Activity   Alcohol use: Yes    Alcohol/week: 1.0 standard drink of alcohol    Types: 1 Glasses of wine per week    Comment: occ   Drug use: No   Sexual activity: Not Currently  Other Topics Concern   Not on file  Social History Narrative   Not on file   Social Drivers of Health   Financial Resource  Strain: Low Risk  (05/19/2023)   Overall Financial Resource Strain (CARDIA)    Difficulty of Paying Living Expenses: Not hard at all  Food Insecurity: No Food Insecurity (05/19/2023)   Hunger Vital Sign    Worried About Running Out of Food in the Last Year: Never true    Ran Out of Food in the Last Year: Never true  Transportation Needs: No Transportation Needs (05/19/2023)   PRAPARE - Administrator, Civil Service (Medical): No    Lack of Transportation (Non-Medical): No  Physical Activity: Inactive (05/19/2023)   Exercise Vital Sign    Days of Exercise per Week: 0 days    Minutes of Exercise per Session: 0 min  Stress: No Stress Concern Present (05/19/2023)   [employer] of Occupational Health -  Occupational Stress Questionnaire    Feeling of Stress : Not at all  Social Connections: Moderately Integrated (05/19/2023)   Social Connection and Isolation Panel [NHANES]    Frequency of Communication with Friends and Family: More than three times a week    Frequency of Social Gatherings with Friends and Family: More than three times a week    Attends Religious Services: More than 4 times per year    Active Member of [employer] or Organizations: Yes    Attends Banker Meetings: More than 4 times per year    Marital Status: Widowed  Recent Concern: Social Connections - Socially Isolated (05/19/2023)   Social Connection and Isolation Panel [NHANES]    Frequency of Communication with Friends and Family: More than three times a week    Frequency of Social Gatherings with Friends and Family: More than three times a week    Attends Religious Services: Never    Database administrator or Organizations: No    Attends Banker Meetings: Never    Marital Status: Widowed    Tobacco Counseling Counseling given: No    Clinical Intake:  Pre-visit preparation completed: Yes  Pain : No/denies pain     BMI - recorded: 27.44 Nutritional Status: BMI 25 -29  Overweight Nutritional Risks: None Diabetes: No  Lab Results  Component Value Date   HGBA1C 5.2 11/27/2021     How often do you need to have someone help you when you read instructions, pamphlets, or other written materials from your doctor or pharmacy?: 1 - Never  Interpreter Needed?: No  Information entered by :: Hassell Halim, CMA   Activities of Daily Living     05/19/2023   11:42 AM  In your present state of health, do you have any difficulty performing the following activities:  Hearing? 0  Vision? 0  Difficulty concentrating or making decisions? 0  Walking or climbing stairs? 0  Dressing or bathing? 0  Doing errands, shopping? 0  Preparing Food and eating ? N  Using the Toilet? N  In the past six months, have you accidently leaked urine? N  Do you have problems with loss of bowel control? N  Managing your Medications? N  Managing your Finances? N  Housekeeping or managing your Housekeeping? N    Patient Care Team: Etta Grandchild, MD as PCP - General (Internal Medicine) Lennette Bihari, MD as PCP - Cardiology (Cardiology) Normajean Glasgow, MD as Referring Physician (Ophthalmology)  Indicate any recent Medical Services you may have received from other than [facility] providers in the past year (date may be approximate).     Assessment:   This is a routine wellness examination for Destenee.  Hearing/Vision screen Hearing Screening - Comments:: Wears hearing aids Vision Screening - Comments:: Wears rx glasses - up to date with routine eye exams with Dr Allena Katz   Goals Addressed               This Visit's Progress     Patient Stated (pt-stated)        Patient stated she would like to continue getting better since hip surgery and get through Physical Therapy.       Depression Screen     05/19/2023   11:46 AM 02/05/2023    8:05 AM 01/16/2021    2:45 PM 12/24/2017   10:43 AM 07/29/2016    4:52 PM 02/09/2015    6:17 PM  PHQ 2/9 Scores  PHQ - 2 Score  0 0 0 0 2 0   PHQ- 9 Score 3    7     Fall Risk     05/19/2023   11:43 AM 03/03/2023    8:22 AM 02/05/2023    8:05 AM 01/16/2021    2:48 PM 12/24/2017   10:43 AM  Fall Risk   Falls in the past year? 0 0 0 0 0  Number falls in past yr: 0 0 0  0  Injury with Fall? 0 0 0  0  Risk for fall due to : No Fall Risks  No Fall Risks    Follow up Falls prevention discussed;Falls evaluation completed  Falls evaluation completed  Falls evaluation completed    MEDICARE RISK AT HOME:  Medicare Risk at Home Any stairs in or around the home?: Yes If so, are there any without handrails?: No Home free of loose throw rugs in walkways, pet beds, electrical cords, etc?: Yes Adequate lighting in your home to reduce risk of falls?: Yes Life alert?: No Use of a cane, walker or w/c?: No Grab bars in the bathroom?: No Shower chair or bench in shower?: No Elevated toilet seat or a handicapped toilet?: No  TIMED UP AND GO:  Was the test performed?  No  Cognitive Function: 6CIT completed        05/19/2023   11:44 AM  6CIT Screen  What Year? 0 points  What month? 0 points  What time? 0 points  Count back from 20 0 points  Months in reverse 0 points  Repeat phrase 0 points  Total Score 0 points    Immunizations Immunization History  Administered Date(s) Administered   Fluad Quad(high Dose 65+) 01/24/2019   Influenza, High Dose Seasonal PF 10/17/2019, 12/18/2020   PFIZER(Purple Top)SARS-COV-2 Vaccination 03/13/2019, 04/05/2019, 01/24/2020   Pneumococcal Conjugate-13 11/20/2014   Pneumococcal Polysaccharide-23 11/17/2013, 01/16/2021   Tdap 07/29/2016   Zoster, Live 12/09/2009    Screening Tests Health Maintenance  Topic Date Due   Zoster Vaccines- Shingrix (1 of 2) 09[birth year]09/1964   COVID-19 Vaccine (4 - 2024-25 season) 10/19/2022   Medicare Annual Wellness (AWV)  05/18/2024   DTaP/Tdap/Td (2 - Td or Tdap) 07/30/2026   Pneumonia Vaccine 25+ Years old  Completed   DEXA SCAN  Completed   Hepatitis C  Screening  Completed   HPV VACCINES  Aged Out   Colonoscopy  Discontinued    Health Maintenance  Health Maintenance Due  Topic Date Due   Zoster Vaccines- Shingrix (1 of 2) 09[birth year]09/1964   COVID-19 Vaccine (4 - 2024-25 season) 10/19/2022   Health Maintenance Items Addressed: 05/19/2023   Additional Screening:  Vision Screening: Recommended annual ophthalmology exams for early detection of glaucoma and other disorders of the eye.  Dental Screening: Recommended annual dental exams for proper oral hygiene  Community Resource Referral / Chronic Care Management: CRR required this visit?  No   CCM required this visit?  No     Plan:     I have personally reviewed and noted the following in the patient's chart:   Medical and social history Use of alcohol, tobacco or illicit drugs  Current medications and supplements including opioid prescriptions. Patient is not currently taking opioid prescriptions. Functional ability and status Nutritional status Physical activity Advanced directives List of other physicians Hospitalizations, surgeries, and ER visits in previous 12 months Vitals Screenings to include cognitive, depression, and falls Referrals and appointments  In addition, I have reviewed and discussed with patient certain preventive protocols,  quality metrics, and best practice recommendations. A written personalized care plan for preventive services as well as general preventive health recommendations were provided to patient.    Darreld Mclean, CMA   05/19/2023   After Visit Summary: (MyChart) Due to this being a telephonic visit, the after visit summary with patients personalized plan was offered to patient via MyChart   Notes: Nothing significant to report at this time.

## 2023-05-19 NOTE — Patient Instructions (Addendum)
 Ms. Steffenhagen , Thank you for taking time to come for your Medicare Wellness Visit. I appreciate your ongoing commitment to your health goals. Please review the following plan we discussed and let me know if I can assist you in the future.   Referrals/Orders/Follow-Ups/Clinician Recommendations: Aim for 30 minutes of exercise or brisk walking, 6-8 glasses of water, and 5 servings of fruits and vegetables each day.   This is a list of the screening recommended for you and due dates:  Health Maintenance  Topic Date Due   Zoster (Shingles) Vaccine (1 of 2) 09[birth year]09/1964   COVID-19 Vaccine (4 - 2024-25 season) 10/19/2022   Medicare Annual Wellness Visit  05/18/2024   DTaP/Tdap/Td vaccine (2 - Td or Tdap) 07/30/2026   Pneumonia Vaccine  Completed   DEXA scan (bone density measurement)  Completed   Hepatitis C Screening  Completed   HPV Vaccine  Aged Out   Colon Cancer Screening  Discontinued    Advanced directives: (Copy Requested) Please bring a copy of your health care power of attorney and living will to the office to be added to your chart at your convenience. You can mail to [facility] [address] [place], [place] [address] or email to [email or web address]  Next Medicare Annual Wellness Visit scheduled for next year: Yes

## 2023-06-08 ENCOUNTER — Ambulatory Visit: Payer: Medicare Other | Attending: Cardiovascular Disease | Admitting: Cardiovascular Disease

## 2023-06-08 ENCOUNTER — Encounter: Payer: Self-pay | Admitting: Cardiovascular Disease

## 2023-06-08 VITALS — BP 114/72 | HR 71 | Ht 66.0 in | Wt 185.2 lb

## 2023-06-08 DIAGNOSIS — Z8673 Personal history of transient ischemic attack (TIA), and cerebral infarction without residual deficits: Secondary | ICD-10-CM | POA: Insufficient documentation

## 2023-06-08 DIAGNOSIS — J849 Interstitial pulmonary disease, unspecified: Secondary | ICD-10-CM | POA: Diagnosis present

## 2023-06-08 DIAGNOSIS — E782 Mixed hyperlipidemia: Secondary | ICD-10-CM | POA: Diagnosis not present

## 2023-06-08 DIAGNOSIS — I10 Essential (primary) hypertension: Secondary | ICD-10-CM | POA: Insufficient documentation

## 2023-06-08 DIAGNOSIS — Z79899 Other long term (current) drug therapy: Secondary | ICD-10-CM | POA: Diagnosis present

## 2023-06-08 DIAGNOSIS — M349 Systemic sclerosis, unspecified: Secondary | ICD-10-CM | POA: Diagnosis present

## 2023-06-08 DIAGNOSIS — I2510 Atherosclerotic heart disease of native coronary artery without angina pectoris: Secondary | ICD-10-CM | POA: Insufficient documentation

## 2023-06-08 NOTE — Progress Notes (Unsigned)
 Cardiology Office Note    Date:  06/09/2023   ID:  Savannah, [name] 05/24/1945, MRN [id number]  PCP:  Arcadio Knuckles, MD  Cardiologist:  Magnus Schuller, MD   Please fax this report to Dr. Simmie Dub, MD at [facility] [phone] or [phone] or Lorelee Roger [phone]  4 month F/U cardiology evaluation and cardiology clearance  History of Present Illness:  Savannah [name] is a 78 y.o. female who presents for a 4 month f/u cardiology evaluation.  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  She has biopsy-proven scleroderma biopsy on her hand around 2005.  She has had issues with shortness of breath in the past.  In 2008, her ejection fraction was 45 to 55% by echocardiography and a Myoview study November 2008 was negative for ischemia with hyperdynamic post stress ejection fraction.  She has been seen by Dr. Julane Ny.  On CT imaging she was found to have mild interstitial lung disease for which she subsequently was referred to Dr. Bertrum Brodie.  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  On that CT she was also noted to have aortic atherosclerosis in addition to left main and three-vessel coronary artery disease.  However this was not gated and assessment of obstructive stenoses could not be made.  On December 23, 2017 she underwent a follow-up echo Doppler study which showed an EF of 55 to 60%.  LV strain was normal at -17.4%.  She did not have regional wall motion abnormalities.  She had mild calcification of her aortic leaflets.  There is trivial MR and PR.  PA pressures were not increased.  She last saw Dr. Julane Ny in November 2019 and presents now for cardiology care and risk management.  She has a history of significant mixed hyperlipidemia  and is on Vascepa  and Livalo , apparently has only been taking Livalo  maybe once or at most twice per week.  Admits to rare to occasional palpitations.  She denies recent chest tightness.  She denies presyncope or syncope.  I have cared for her husband.  She has a history of snoring but states her sleep is restorative.  She typically goes to [place]  from mid January through June.  When I saw her in December 2019 I recommended that she undergo coronary CT angiography to further elucidate her coronary anatomy after she was found to have plaque on her chest CT.  This was performed on March 01, 2018.  Her calcium  score was 20 which was only 44 percentile for age and sex.  There was mild plaque noted with less than 30% stenosis in the proximal and mid LAD, circumflex and right coronary arteries.  She had a normal aortic root at 3.3 cm.  I also recommended Vascepa  in addition to Livalo  for her mixed hyperlipidemia and added Zetia  for an additional 20% lowering.  She went to [place]  from January until October and returned last week.  Apparently she has only been taking Zetia  2 times per week and is only been taking Vascepa  2 times per week.  She has not had relaboratory.  She is no longer taking Livalo  and apparently stopped taking it.  She denied chest pain PND orthopnea.  She will again begin to [place]  in early 2021.    Since I last  saw her, she had laboratory in October 2020.  Total cholesterol was 167, triglycerides 185, HDL 41, LDL 94.  At that time, I had suggested the patient increase Zetia  to 10 mg daily and also increase Vascepa  to daily and she had only been taking these medicines 2 days/week.  She was evaluated by Dr. Bensimhon in November 2020.  An echo Doppler study December 30, 2018 showed EF 60 to 65% with grade 1 diastolic dysfunction.  Atrial size was normal.  She was felt to be well compensated guarding her scleroderma with related mild interstitial lung disease.  Her echo Doppler study there was  no evidence for pulmonary hypertension or RV strain.  Her PFTs showed stable spirometry and improved DLCO.  A high resolution CT in October 2020 showed mild ILD slightly improved from previous assessment.  She was noted to have coronary calcification on CT imaging.  She was active without chest pain or exertional dyspnea and preferred avoiding further testing.  I saw her in January 2021 prior to her returning back to [place] .  At that time she denied any chest pain, PND orthopnea.  She had  been taking losartan  100 mg daily.  She has a prescription for furosemide  but she has not required use and had not taken this recently.  Apparently she was still only taking Zetia  10 mg 2 days/week and Vascepa  1 g by mouth once a week.  During that evaluation, I again reviewed her coronary CTA which revealed a calcium  score of 20 and mild plaque in the LAD, circumflex and RCA.  At that time she was no longer taking Livalo  but had tolerated that well in the past and I recommended resumption of Livalo  2 mg and to try initiating this at least 2 days/week.  I also recommended she try increasing Zetia  if not to 10 mg daily then at least every other day.  I last saw her on January 09, 2020.  She had recently returned from [place]  with her husband.  Her blood pressure had been somewhat labile with systolic pressure ranging from 1 20-1 60.  On one occasion it had reached 172 mmHg.  She denies chest pain or significant shortness of breath.  She is scheduled to undergo pulmonary function studies on November 30 as well as a follow-up echo Doppler study on December 6.  She is also followed by Dr. Bensimhon for her scleroderma study and has a follow-up office visit with Dr. Bertrum Brodie regarding her interstitial lung disease.    She was evaluated by Lawana Pray in November 2022.  It was very active taking care of her husband who had severe Parkinson's disease.  She underwent a follow-up high-resolution chest CT on February 05, 2021  ordered by Dr. Bertrum Brodie which showed minimal basilar reticular opacities with no evidence for progression.  The findings were indeterminate for UIP.  There was bilateral air trapping which may be seen in the setting of small airways disease.  She was noted to have coronary artery calcifications and aortic atherosclerosis.  On February 12, 2021 she underwent a follow-up echo Doppler which showed EF 60 to 65% and grade 1 diastolic dysfunction.  PA pressure was normal and valves essentially were normal.    I saw her on November 27, 2021. Unfortunately her husband Lodema Parma who also was one of my patients died on 2021-12-13 at 78 years old.  She will be going down to [place]  with a friend to help her with the department down there but  will be returning back to [place].  She denies any chest pain.  She notes rare indigestion.  She continues to have scleroderma and at times notes trace ankle swelling.  She has not had recent laboratory.  She states her blood pressure at home typically runs in the 130s to 150s.  She had recently been started on metoprolol  succinate and was taking this 25 mg twice a day.    I saw her Jun 18, 2022.  She was continuing to go down to [place]  every several months.  She has been on losartan  100 mg metoprolol  succinate 50 mg daily for hypertension.  She brought with her blood pressure recordings, blood pressure oftentimes is mildly elevated.  Her blood pressures have ranged from systolics in the 1 teens and 120s to systolics in the 150s 160s and rarely 170s.  Oftentimes she may note her higher blood pressure in the morning.  She denies any chest pain or shortness of breath.  She is unaware of palpitations.  She has not had laboratory since October 2023 which showed total cholesterol 139 HDL 48, LDL 63 and triglycerides 167.  She apparently has only been taking Vascepa  1 capsule 3 times a week and Zetia  3 times per week Livalo  2 mg 3 times a week.  She is on aspirin 81 mg.    I  last saw her on February 02, 2023.  Since her previous evaluation, she has been evaluated by [facility] and is in need to undergo left total hip arthroplasty is tentatively scheduled for March 25, 2023 in [place] near [place].  Call us  of her hip issues, she has not been able to exercise.  She has not been down to [place] .  She has been taking her blood pressures at home and states typically these often run 1 30-1 50 range systolically.  Has been taking 40 mg at bedtime and metoprolol  succinate 50 mg in the morning he has a prescription for furosemide  which she takes rarely for additional leg swelling.  She has a history of mixed hyperlipidemia and apparently reduced her medications and is now only taking Zetia  10 mg 2 times per week, Vascepa  capsule 3 times a week, pitavastatin  2 mg only twice a week.  In the past she did not tolerate rosuvastatin or atorvastatin .  She had undergone laboratory in June 2024 which showed lipids with total cholesterol 231, triglycerides 193, LDL cholesterol 140.  Since I last saw her, she was evaluated by Dr. Bertrum Brodie in January 2025.  She has been diagnosed with scleroderma treated with treks 8.  She underwent successful total hip arthroplasty by Dr. Mahalia Schultze.  Currently, she is on olmesartan  HCT 40/12.5 mg in the morning and takes metoprolol  succinate 50 mg at night.  She is on Zetia  10 mg and Vascepa  1 capsule daily in addition to atorvastatin  2 mg daily for her mixed hyperlipidemia.  She is on baby aspirin.  She takes furosemide  on a as needed basis for leg swelling.  She denies chest pain or shortness of breath.  Lipid studies.  She presents for evaluation.    Past Medical History:  Diagnosis Date   Dyspnea    MYOVIEW, 12/29/2006 - post-stress EF 81%,no ECG changes, EKG negative for ischemia   Hyperlipidemia    Hypertension    2D ECHO, 12/24/2006 - EF 45-55%, normal   Osteoarthritis    Swelling of extremity    LEA VENOUS DUPLEX, 08/19/2011 - deep  valvular insufficiency noted with right femoral and popliteal veins  and left common femoral vein   TIA (transient ischemic attack)    CAROTID DOPPLER, 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    Past Surgical History:  Procedure Laterality Date   TONSILLECTOMY     TOTAL HIP ARTHROPLASTY Left    Dr Henderson Lock - surgeon    Current Medications: Outpatient Medications Prior to Visit  Medication Sig Dispense Refill   aspirin 81 MG tablet Take 81 mg by mouth.     b complex vitamins capsule Take 1 capsule by mouth 2 (two) times a week.     Calcium  Carbonate (CALCIUM  600 PO) Take 600 mg by mouth daily.     Cholecalciferol (D3 MAXIMUM STRENGTH PO) Take 5,000 Units by mouth 3 (three) times a week.     Coenzyme Q10 200 MG capsule Take 200 mg by mouth daily.     diclofenac (VOLTAREN) 0.1 % ophthalmic solution Place 1 drop into the left eye 2 (two) times daily.     exemestane (AROMASIN) 25 MG tablet Take 25 mg by mouth daily after breakfast.     ezetimibe  (ZETIA ) 10 MG tablet Take 10 mg by mouth daily.     furosemide  (LASIX ) 20 MG tablet TAKE 20 MG( 1/2 TABLET ) TO 40 MG ( 1 TABLET) AS NEEDED FOR FLUID OR EDEMA 180 tablet 1   loratadine (CLARITIN) 10 MG tablet Take 10 mg by mouth daily as needed for allergies.     Magnesium 300 MG CAPS Take 300 mg by mouth daily.     MAGNESIUM CL-CALCIUM  CARBONATE PO Take 400 mg by mouth daily.     Menaquinone-7 (VITAMIN K2 PO) Take 50 mcg by mouth 3 (three) times a week.     metoprolol  succinate (TOPROL -XL) 50 MG 24 hr tablet TAKE 1 TABLET BY MOUTH EVERY DAY. TAKE WITH OR IMMEDIATELY FOLLOWING A MEAL. 90 tablet 3   olmesartan -hydrochlorothiazide (BENICAR  HCT) 40-12.5 MG tablet Take 1 tablet by mouth daily. 90 tablet 3   Pitavastatin  Calcium  2 MG TABS Take 1 tablet (2 mg total) by mouth daily. 90 tablet 3   potassium chloride  SA (KLOR-CON  M20) 20 MEQ tablet Take 1 tablet (20 mEq total) by mouth daily as needed. When you take Furosemide   (Lasix ). 90 tablet 3   valACYclovir  (VALTREX ) 1000 MG tablet Take 1 tablet (1,000 mg total) by mouth 2 (two) times daily. (Patient taking differently: Take 1,000 mg by mouth as needed.) 180 tablet 1   VASCEPA  1 g capsule Take 1 capsule (1 g total) by mouth every morning. 90 capsule 3   vitamin E 180 MG (400 UNITS) capsule Take 400 Units by mouth daily.     No facility-administered medications prior to visit.     Allergies:   Celecoxib, Codeine, Cymbalta [duloxetine hcl], Lyrica [pregabalin], Morphine , Penicillins, Sulfa antibiotics, and Zocor [simvastatin]   Social History   Socioeconomic History   Marital status: Widowed    Spouse name: Not on file   Number of children: Not on file   Years of education: Not on file   Highest education level: Some college, no degree  Occupational History   Not on file  Tobacco Use   Smoking status: Never    Passive exposure: Never   Smokeless tobacco: Never  Vaping Use   Vaping status: Never Used  Substance and Sexual Activity   Alcohol use: Yes    Alcohol/week: 1.0 standard drink of alcohol    Types: 1 Glasses of wine per week  Comment: occ   Drug use: No   Sexual activity: Not Currently  Other Topics Concern   Not on file  Social History Narrative   Not on file   Social Drivers of Health   Financial Resource Strain: Low Risk  (05/28/2023)   Received from [facility]   Overall Financial Resource Strain (CARDIA)    Difficulty of Paying Living Expenses: Not hard at all  Food Insecurity: No Food Insecurity (05/28/2023)   Received from [facility]   Hunger Vital Sign    Worried About Running Out of Food in the Last Year: Never true    Ran Out of Food in the Last Year: Never true  Transportation Needs: No Transportation Needs (05/28/2023)   Received from [facility] - Transportation    Lack of Transportation (Medical): No    Lack of Transportation (Non-Medical): No  Physical Activity: Inactive (05/19/2023)   Exercise  Vital Sign    Days of Exercise per Week: 0 days    Minutes of Exercise per Session: 0 min  Stress: No Stress Concern Present (05/19/2023)   [employer] of Occupational Health - Occupational Stress Questionnaire    Feeling of Stress : Not at all  Social Connections: Moderately Integrated (05/19/2023)   Social Connection and Isolation Panel [NHANES]    Frequency of Communication with Friends and Family: More than three times a week    Frequency of Social Gatherings with Friends and Family: More than three times a week    Attends Religious Services: More than 4 times per year    Active Member of [employer] or Organizations: Yes    Attends Banker Meetings: More than 4 times per year    Marital Status: Widowed  Recent Concern: Social Connections - Socially Isolated (05/19/2023)   Social Connection and Isolation Panel [NHANES]    Frequency of Communication with Friends and Family: More than three times a week    Frequency of Social Gatherings with Friends and Family: More than three times a week    Attends Religious Services: Never    Database administrator or Organizations: No    Attends Banker Meetings: Never    Marital Status: Widowed     Family History:  The patient's family history includes Angina (age of onset: 64) in her maternal grandfather; Atrial fibrillation (age of onset: 36) in her mother; Cirrhosis (age of onset: 26) in her father; Heart attack (age of onset: 69) in her maternal grandfather; Hyperlipidemia in her father; Hypertension in her mother; Hypertension (age of onset: 48) in her maternal grandmother; Kidney failure (age of onset: 72) in her father; Lung disease in her paternal grandmother; Stroke (age of onset: 75) in her maternal grandmother.   ROS General: Negative; No fevers, chills, or night sweats;  HEENT: Negative; No changes in vision or hearing, sinus congestion, difficulty swallowing Pulmonary: mild interstitial lung  disease Cardiovascular: See HPI GI: Negative; No nausea, vomiting, diarrhea, or abdominal pain GU: Negative; No dysuria, hematuria, or difficulty voiding Musculoskeletal: Chronic left hip discomfort, s/p left total hip arthroplasty Rheumatologic: scleroderma Hematologic/Oncology: Negative; no easy bruising, bleeding Endocrine: Negative; no heat/cold intolerance; no diabetes Neuro: History of TIA Skin: Scleroderma Psychiatric: Negative; No behavioral problems, depression Sleep: Positive for snoring, no daytime sleepiness, hypersomnolence, bruxism, restless legs, hypnogognic hallucinations, no cataplexy Other comprehensive 14 point system review is negative.   PHYSICAL EXAM:   VS:  BP 114/72 (BP Location: Left Arm, Patient Position: Sitting, Cuff Size: Normal)  Pulse 71   Ht 5\' 6"  (1.676 m)   Wt 185 lb 3.2 oz (84 kg)   SpO2 94%   BMI 29.89 kg/m     Repeat blood pressure by me was 114/64  Wt Readings from Last 3 Encounters:  06/08/23 185 lb 3.2 oz (84 kg)  05/19/23 170 lb (77.1 kg)  03/03/23 187 lb 12.8 oz (85.2 kg)     General: Alert, oriented, no distress.  Skin: normal turgor, no rashes, warm and dry HEENT: Normocephalic, atraumatic. Pupils equal round and reactive to light; sclera anicteric; extraocular muscles intact;  Nose without nasal septal hypertrophy Mouth/Parynx benign; Mallinpatti scale 3 Neck: No JVD, no carotid bruits; normal carotid upstroke Lungs: clear to ausculatation and percussion; no wheezing or rales Chest wall: without tenderness to palpitation Heart: PMI not displaced, RRR, s1 s2 normal, 1/6 systolic murmur, no diastolic murmur, no rubs, gallops, thrills, or heaves Abdomen: soft, nontender; no hepatosplenomehaly, BS+; abdominal aorta nontender and not dilated by palpation. Back: no CVA tenderness Pulses 2+ Musculoskeletal: full range of motion, normal strength, no joint deformities Extremities: no clubbing cyanosis or edema, Homan's sign negative   Neurologic: grossly nonfocal; Cranial nerves grossly wnl Psychologic: Normal mood and affect    Studies/Labs Reviewed:   EKG Interpretation Date/Time:  Monday June 08 2023 12:06:22 EDT Ventricular Rate:  70 PR Interval:  182 QRS Duration:  72 QT Interval:  372 QTC Calculation: 401 R Axis:   51  Text Interpretation: Normal sinus rhythm Cannot rule out Anterior infarct , age undetermined When compared with ECG of 02-Feb-2023 14:12, Minimal criteria for Anterior infarct are now Present Confirmed by Magnus Schuller ([id number]) on 06/09/2023 8:38:34 AM    February 02, 2023 ECG (independently read by me): Normal sinus rhythm at 77.  Jun 18, 2022 ECG (independently read by me): Normal sinus rhythm at 79 bpm.  Small Q wave lead III and aVF.  No ectopy.  No ST segment changes.  November 27, 2021 ECG (independently read by me): NS at 78, mild sinus arrythmia  January 09, 2020 ECG (independently read by me): Normal sinus rhythm with mild sinus arrhythmia at 78 bpm.  Borderline first-degree block with a PR interval 206 ms;QTc interval 419 ms.  January 2021 ECG (independently read by me): Normal sinus rhythm at 75 bpm.  Poor anterior R wave progression V1 through V3.  No ectopy.  Normal intervals.  October 2020 ECG (independently read by me): NSR at 74; normal interval, no extopy  December 2019 ECG (independently read by me): Normal sinus rhythm at 82 bpm.  No ectopy.  Normal intervals.  Recent Labs:    Latest Ref Rng & Units 02/05/2023    8:38 AM 12/02/2022   11:39 AM 07/30/2022   10:40 AM  BMP  Glucose 70 - 99 mg/dL 604  82  96   BUN 6 - 23 mg/dL 21  18  16    Creatinine 0.40 - 1.20 mg/dL 5.40  9.81  1.91   BUN/Creat Ratio 12 - 28   15   Sodium 135 - 145 mEq/L 141  141  141   Potassium 3.5 - 5.1 mEq/L 4.1  4.2  4.6   Chloride 96 - 112 mEq/L 102  106  104   CO2 19 - 32 mEq/L 31  29  26    Calcium  8.4 - 10.5 mg/dL 47.8  29.5  62.1         Latest Ref Rng & Units 02/06/2023    4:13 PM  07/30/2022   10:40 AM 11/27/2021    9:33 AM  Hepatic Function  Total Protein 6.0 - 8.3 g/dL 7.0  6.6  6.4   Albumin 3.5 - 5.2 g/dL 4.5  4.3  4.4   AST 0 - 37 U/L 22  13  22    ALT 0 - 35 U/L 25  14  29    Alk Phosphatase 39 - 117 U/L 60  69  70   Total Bilirubin 0.2 - 1.2 mg/dL 1.3  1.1  1.3   Bilirubin, Direct 0.0 - 0.3 mg/dL 0.2          Latest Ref Rng & Units 02/05/2023    8:38 AM 07/30/2022   10:40 AM 11/27/2021    9:33 AM  CBC  WBC 4.0 - 10.5 K/uL 7.0  6.4  6.1   Hemoglobin 12.0 - 15.0 g/dL 16.1  09.6  04.5   Hematocrit 36.0 - 46.0 % 42.3  42.6  41.2   Platelets 150.0 - 400.0 K/uL 265.0  274  278    Lab Results  Component Value Date   MCV 93.5 02/05/2023   MCV 91 07/30/2022   MCV 90 11/27/2021   Lab Results  Component Value Date   TSH 3.43 12/02/2022   Lab Results  Component Value Date   HGBA1C 5.2 11/27/2021     BNP No results found for: "BNP"  ProBNP No results found for: "PROBNP"   Lipid Panel     Component Value Date/Time   CHOL 123 02/05/2023 0838   CHOL 231 (H) 07/30/2022 1040   TRIG 217.0 (H) 02/05/2023 0838   HDL 34.70 (L) 02/05/2023 0838   HDL 57 07/30/2022 1040   CHOLHDL 4 02/05/2023 0838   VLDL 43.4 (H) 02/05/2023 0838   LDLCALC 45 02/05/2023 0838   LDLCALC 140 (H) 07/30/2022 1040   LDLDIRECT 73.0 01/16/2021 1524     RADIOLOGY: No results found.   Additional studies/ records that were reviewed today include:  I reviewed the patient's evaluations from Dr. Julane Ny and Bertrum Brodie.  I reviewed her prior echo Doppler studies, chest CT and laboratory.   ECHO 12/30/2018 IMPRESSIONS   1. Left ventricular ejection fraction, by visual estimation, is 60 to  65%. The left ventricle has normal function. There is no left ventricular  hypertrophy.   2. Left ventricular diastolic parameters are consistent with Grade I  diastolic dysfunction (impaired relaxation).   3. Global right ventricle has normal systolic function.The right  ventricular  size is normal. No increase in right ventricular wall  thickness.   4. Left atrial size was normal.   5. Right atrial size was normal.   6. The mitral valve is normal in structure. Trace mitral valve  regurgitation.   7. The tricuspid valve is normal in structure. Tricuspid valve  regurgitation is trivial.   8. The aortic valve is normal in structure. Aortic valve regurgitation is  not visualized.   9. The pulmonic valve was grossly normal. Pulmonic valve regurgitation is  not visualized.  10. The inferior vena cava is normal in size with greater than 50%  respiratory variability, suggesting right atrial pressure of 3 mmHg.   ASSESSMENT:    1. Primary hypertension   2. Medication management   3. Coronary artery calcification seen on CAT scan   4. Mixed hyperlipidemia   5. History of TIA (transient ischemic attack)   6. ILD (interstitial lung disease) (HCC)   7. Scleroderma [facility])     PLAN:  Ms. Mecca Guitron  is a 78 year-old female who has a history of hypertension, significant mixed hyperlipidemia with prior triglycerides at 669 in 2018, remote history of TIA, lower extremity varicose veins, as well as a biopsy-proven history of scleroderma.  She has not been found to have pulmonary hypertension.  She has mild interstitial lung disease followed by Dr. Bertrum Brodie.  She has aortic atherosclerosis and multivessel plaque on a high-resolution chest CT.   Calcium  score was 20 and there was evidence for mild plaque less than 30% in the LAD, circumflex, and RCA.  In the past, she had intolerances to several of the statins but has been able to take Livalo .  Oftentimes, she has only been taking her lipid-lowering therapy 2 to at most 3 days/week.  I reviewed her most recent lipid assessment from June 2024 which showed total cholesterol 231, HDL 57, LDL 140, triglycerides 193.  At her prior evaluation, she has only been taking Zetia  2 days/week, instead of multiple capsules of Vascepa  she has only been  taking 1 capsule 3 days a week, and pitavastain 2 mg only twice a week.  I have recommended that she at least take Zetia  on a daily basis and her statin on a daily basis.  Ideally she should at least take Vascepa  daily as well preferably 1 twice a day instead of just 1.  She underwent successful left hip total arthroplasty.  She brought her blood pressure recordings from home with a blood pressure in the office today is stable at 114/64.  She continues to be on olmesartan  HCT 40/12.5 mg which she takes in the morning and metoprolol  succinate 50 mg which she takes as needed.  She has a prescription for furosemide  to take 20 or 40 mg on an as-needed basis for edema.  She continues to be on Zetia , Vascepa , and atorvastatin  for her mixed type edema.  She continues to see Dr. Mickeal Aland for primary care.  I am recommending she obtain follow-up fasting comprehensive metabolic panel and lipid panel on current therapy.  She will be seeing Dr. Sandra Crouch in October for follow-up primary care evaluation.  I will transition her to the care of Dr. Alvis Ba who are taking care of her husband for he passed away and we will schedule her a appointment in approximately 12 months or sooner as needed.   Medication Adjustments/Labs and Tests Ordered: Current medicines are reviewed at length with the patient today.  Concerns regarding medicines are outlined above.  Medication changes, Labs and Tests ordered today are listed in the Patient Instructions below. Patient Instructions  Medication Instructions:  NO CHANGES *If you need a refill on your cardiac medications before your next appointment, please call your pharmacy*  Lab Work: CMP AND FASTING LIPID PANEL WHEN FASTING If you have labs (blood work) drawn today and your tests are completely normal, you will receive your results only by: MyChart Message (if you have MyChart) OR A paper copy in the mail If you have any lab test that is abnormal or we need to change your  treatment, we will call you to review the results.  Testing/Procedures: NO TESTING  Follow-Up: At [facility], you and your health needs are our priority.  As part of our continuing mission to provide you with exceptional heart care, our providers are all part of one team.  This team includes your primary Cardiologist (physician) and Advanced Practice Providers or APPs (Physician Assistants and Nurse Practitioners) who all work together to provide you with the  care you need, when you need it.  Your next appointment:   1 year(s)  Provider:   Luana Rumple, MD    Other Instructions   1st Floor: - Lobby - Registration  - Pharmacy  - Lab - Cafe  2nd Floor: - PV Lab - Diagnostic Testing (echo, CT, nuclear med)  3rd Floor: - Vacant  4th Floor: - TCTS (cardiothoracic surgery) - AFib Clinic - Structural Heart Clinic - Vascular Surgery  - Vascular Ultrasound  5th Floor: - HeartCare Cardiology (general and EP) - Clinical Pharmacy for coumadin, hypertension, lipid, weight-loss medications, and med management appointments    Valet parking services will be available as well.  '  Signed, Magnus Schuller, MD  06/09/2023 8:53 AM    [facility] [address], [place], [place]  [address] Phone: [phone]

## 2023-06-08 NOTE — Patient Instructions (Signed)
 Medication Instructions:  NO CHANGES *If you need a refill on your cardiac medications before your next appointment, please call your pharmacy*  Lab Work: CMP AND FASTING LIPID PANEL WHEN FASTING If you have labs (blood work) drawn today and your tests are completely normal, you will receive your results only by: MyChart Message (if you have MyChart) OR A paper copy in the mail If you have any lab test that is abnormal or we need to change your treatment, we will call you to review the results.  Testing/Procedures: NO TESTING  Follow-Up: At [facility], you and your health needs are our priority.  As part of our continuing mission to provide you with exceptional heart care, our providers are all part of one team.  This team includes your primary Cardiologist (physician) and Advanced Practice Providers or APPs (Physician Assistants and Nurse Practitioners) who all work together to provide you with the care you need, when you need it.  Your next appointment:   1 year(s)  Provider:   Luana Rumple, MD    Other Instructions   1st Floor: - Lobby - Registration  - Pharmacy  - Lab - Cafe  2nd Floor: - PV Lab - Diagnostic Testing (echo, CT, nuclear med)  3rd Floor: - Vacant  4th Floor: - TCTS (cardiothoracic surgery) - AFib Clinic - Structural Heart Clinic - Vascular Surgery  - Vascular Ultrasound  5th Floor: - HeartCare Cardiology (general and EP) - Clinical Pharmacy for coumadin, hypertension, lipid, weight-loss medications, and med management appointments    Valet parking services will be available as well.  '

## 2023-06-09 ENCOUNTER — Encounter: Payer: Self-pay | Admitting: Cardiovascular Disease

## 2023-06-11 ENCOUNTER — Ambulatory Visit: Payer: Medicare Other | Admitting: Cardiovascular Disease

## 2023-06-11 LAB — COMPREHENSIVE METABOLIC PANEL WITH GFR
ALT: 29 IU/L (ref 0–32)
AST: 21 IU/L (ref 0–40)
Albumin: 4.2 g/dL (ref 3.8–4.8)
Alkaline Phosphatase: 58 IU/L (ref 44–121)
BUN/Creatinine Ratio: 19 (ref 12–28)
BUN: 18 mg/dL (ref 8–27)
Bilirubin Total: 0.6 mg/dL (ref 0.0–1.2)
CO2: 24 mmol/L (ref 20–29)
Calcium: 10.2 mg/dL (ref 8.7–10.3)
Chloride: 107 mmol/L — ABNORMAL HIGH (ref 96–106)
Creatinine, Ser: 0.96 mg/dL (ref 0.57–1.00)
Globulin, Total: 1.7 g/dL (ref 1.5–4.5)
Glucose: 94 mg/dL (ref 70–99)
Potassium: 4.8 mmol/L (ref 3.5–5.2)
Sodium: 145 mmol/L — ABNORMAL HIGH (ref 134–144)
Total Protein: 5.9 g/dL — ABNORMAL LOW (ref 6.0–8.5)
eGFR: 61 mL/min/{1.73_m2} (ref >59)

## 2023-06-11 LAB — LIPID PANEL
Chol/HDL Ratio: 2.7 ratio (ref 0.0–4.4)
Cholesterol, Total: 112 mg/dL (ref 100–199)
HDL: 41 mg/dL (ref >39)
LDL Chol Calc (NIH): 48 mg/dL (ref 0–99)
Triglycerides: 127 mg/dL (ref 0–149)
VLDL Cholesterol Cal: 23 mg/dL (ref 5–40)

## 2023-08-03 ENCOUNTER — Ambulatory Visit: Attending: Cardiology | Admitting: Cardiology

## 2023-08-03 ENCOUNTER — Encounter: Payer: Self-pay | Admitting: Cardiology

## 2023-08-03 ENCOUNTER — Telehealth: Payer: Self-pay | Admitting: Cardiovascular Disease

## 2023-08-03 VITALS — BP 114/66 | HR 74 | Ht 66.0 in | Wt 181.4 lb

## 2023-08-03 DIAGNOSIS — J849 Interstitial pulmonary disease, unspecified: Secondary | ICD-10-CM | POA: Diagnosis present

## 2023-08-03 DIAGNOSIS — I10 Essential (primary) hypertension: Secondary | ICD-10-CM | POA: Diagnosis not present

## 2023-08-03 NOTE — Progress Notes (Signed)
 Cardiology Office Note:  .   Date:  08/03/2023  ID:  Savannah [name], DOB 1946/01/06, MRN [id number] PCP: Arcadio Knuckles, MD  [facility] Providers Cardiologist:  Fransico Ivy, MD PCP: Arcadio Knuckles, MD  Chief Complaint  Patient presents with   Hypertension     Savannah [name] is a 78 y.o. female with hypertension, hyperlipidemia, coronary calcification, scleroderma, ILD, h/o TIA, venous insufficiency   History of Present Illness  Patient has recently noted some variation in her blood pressure readings.  She has an upcoming trip to [place] , and was therefore concerned.  She denies any chest pain, shortness of breath.  Yesterday, when her systolic blood pressure 95, she did not feel good, but denies any specific chest pain, shortness of breath, presyncope or syncope symptoms.  Today, she is back to her usual baseline, blood pressure is normal.   Vitals:   08/03/23 1402  BP: 114/66  Pulse: 74  SpO2: 98%      Review of Systems  Cardiovascular:  Negative for chest pain, dyspnea on exertion, leg swelling, palpitations and syncope.        Studies Reviewed: .        Echocardiogram 01/2021: 1. Left ventricular ejection fraction, by estimation, is 60 to 65%. The  left ventricle has normal function. The left ventricle has no regional  wall motion abnormalities. Left ventricular diastolic parameters are  consistent with Grade I diastolic  dysfunction (impaired relaxation).   2. Right ventricular systolic function is normal. The right ventricular  size is normal. There is normal pulmonary artery systolic pressure.   3. The mitral valve is normal in structure. Trivial mitral valve  regurgitation. No evidence of mitral stenosis.   4. The aortic valve is tricuspid. Aortic valve regurgitation is not  visualized. No aortic stenosis is present.   5. The inferior vena cava is normal in size with greater than 50%  respiratory variability, suggesting right atrial pressure  of 3 mmHg.   Cor CTA 2020: 1.  Calcium  score 20 which is only 44 th percentile for age and sex 2.  Non obstructive CAD  3.  Normal aortic root 3.3 cm  Independently interpreted 01/2023-05/2023: Chol 112, TG 127, HDL 41, LDL 48 Hb 14.7 Cr 2.95, Na 145, protein 5.9  2023: HbA1C 5.2%   Physical Exam Vitals and nursing note reviewed.  Constitutional:      General: She is not in acute distress. Neck:     Vascular: No JVD.   Cardiovascular:     Rate and Rhythm: Normal rate and regular rhythm.     Heart sounds: Normal heart sounds. No murmur heard. Pulmonary:     Effort: Pulmonary effort is normal.     Breath sounds: Normal breath sounds. No wheezing or rales.   Musculoskeletal:     Right lower leg: No edema.     Left lower leg: No edema.      VISIT DIAGNOSES:   ICD-10-CM   1. ILD (interstitial lung disease) (HCC)  J84.9 ECHOCARDIOGRAM COMPLETE    2. Primary hypertension  I10 ECHOCARDIOGRAM COMPLETE       Savannah [name] is a 78 y.o. female with hypertension, hyperlipidemia, coronary calcification, scleroderma, ILD, h/o TIA, venous insufficiency Assessment & Plan  Hypertension: Blood pressure is generally well-controlled, barring few outliers of elevated blood pressure, and only 1 reading of SBP 95 MNG.  Continue current medications, including olmesartan -HCTZ, metoprolol .  Can hold metoprolol  if blood pressure is low.  In  the long run, if her blood pressure is well-controlled, metoprolol  not absolutely needed.  Encourage 2-3 L of water every day, avoid excess salt.  Interstitial lung disease, scleroderma: Last echocardiogram in 2022.  Recommend echocardiogram to ensure no pulmonary hypertension.  Patient previously saw Dr. Loetta Ringer, was assigned to see Dr. Alvis Ba going forward.  I will defer to Dr. Alvis Ba regarding long-term follow-up recommendations.    Signed, Cody Das, MD

## 2023-08-03 NOTE — Telephone Encounter (Signed)
 Spoke with Pt. Yesterday felt out of sorts BP was 95/55 HR 100. Pt states she is just not feeling right and BP is all over the place. While on the phone BP130/64 HR 80. Pt is going out of town and would like to be seen. Appointment set for DOD spot for 2pm.

## 2023-08-03 NOTE — Telephone Encounter (Signed)
 Pt c/o BP issue:  1. What are your last 5 BP readings? 95/55 pulse 100, last night, 113/66 Saturday  116/62 Friday, 140/76 on 07-27-23 101/60,    2. Are you having any other symptoms (ex. Dizziness, headache, blurred vision, passed out)? feels weak  3. What is your medication issue? Patient says her blood pressure is all over the place- patient wants to be seen

## 2023-08-03 NOTE — Patient Instructions (Signed)
 Testing/Procedures: Echo   Your physician has requested that you have an echocardiogram. Echocardiography is a painless test that uses sound waves to create images of your heart. It provides your doctor with information about the size and shape of your heart and how well your heart's chambers and valves are working. This procedure takes approximately one hour. There are no restrictions for this procedure. Please do NOT wear cologne, perfume, aftershave, or lotions (deodorant is allowed). Please arrive 15 minutes prior to your appointment time.  Please note: We ask at that you not bring children with you during ultrasound (echo/ vascular) testing. Due to room size and safety concerns, children are not allowed in the ultrasound rooms during exams. Our front office staff cannot provide observation of children in our lobby area while testing is being conducted. An adult accompanying a patient to their appointment will only be allowed in the ultrasound room at the discretion of the ultrasound technician under special circumstances. We apologize for any inconvenience.   Follow-Up: At [facility], you and your health needs are our priority.  As part of our continuing mission to provide you with exceptional heart care, our providers are all part of one team.  This team includes your primary Cardiologist (physician) and Advanced Practice Providers or APPs (Physician Assistants and Nurse Practitioners) who all work together to provide you with the care you need, when you need it.  Your next appointment:   AS SCHEDULED  Provider:   Dr. Alvis Ba

## 2023-08-10 ENCOUNTER — Telehealth: Payer: Self-pay | Admitting: Cardiology

## 2023-08-10 DIAGNOSIS — I10 Essential (primary) hypertension: Secondary | ICD-10-CM

## 2023-08-10 NOTE — Telephone Encounter (Signed)
 [facility] needs order for echocardiogram to be sent to [phone], placed external order.

## 2023-08-10 NOTE — Telephone Encounter (Signed)
 Fax order to number provided. Fax has been received. Canceled echo for our office.

## 2023-08-10 NOTE — Telephone Encounter (Signed)
 Patient is calling to get a script sent to [facility] to have her echo fax # is [phone] attention to [employer]. Please advise

## 2023-08-11 NOTE — Telephone Encounter (Signed)
 Order has been signed by Dr Elmira and faxed as requested.

## 2023-08-11 NOTE — Telephone Encounter (Signed)
 Tiffany with [facility] called in stating they have order but it has to have MD signature on it. She asked if it can be signed by him and faxed back over. Pt is scheduled for echo tomorrow morning.   Fax: [phone] or [phone]

## 2023-08-17 ENCOUNTER — Encounter: Payer: Self-pay | Admitting: Cardiology

## 2023-08-18 NOTE — Telephone Encounter (Signed)
 Reviewed.  Normal pumping function of the heart. No severe heart valve abnormalities noted.   Thanks MJP

## 2023-08-20 ENCOUNTER — Ambulatory Visit (HOSPITAL_COMMUNITY)

## 2023-08-26 ENCOUNTER — Encounter: Payer: Self-pay | Admitting: Cardiology

## 2023-08-26 ENCOUNTER — Other Ambulatory Visit

## 2023-08-27 ENCOUNTER — Ambulatory Visit: Payer: Self-pay | Admitting: Cardiology

## 2023-08-27 NOTE — Progress Notes (Signed)
 While pulmonary artery pressures could not be directly evaluated due to technical limitations, no indirect evidence of pulmonary hypertension.  This is reassuring.  Thanks MJP

## 2023-08-31 NOTE — Progress Notes (Signed)
 MyChart message containing providers result note and interpretation read by patient on: Last read by Rumalda DELENA Hammersmith at 12:17PM on 08/31/2023.

## 2023-09-15 ENCOUNTER — Other Ambulatory Visit: Payer: Self-pay | Admitting: Cardiovascular Disease

## 2023-10-15 ENCOUNTER — Other Ambulatory Visit: Payer: Self-pay

## 2023-10-15 ENCOUNTER — Other Ambulatory Visit (HOSPITAL_COMMUNITY)

## 2023-10-15 MED ORDER — OLMESARTAN MEDOXOMIL-HCTZ 40-12.5 MG PO TABS: 1.0000 | ORAL_TABLET | Freq: Every day | ORAL | 2 refills | Status: AC

## 2023-11-16 ENCOUNTER — Telehealth: Payer: Self-pay | Admitting: Rheumatology

## 2023-11-16 NOTE — Telephone Encounter (Signed)
 Patient has seen Dr. Dolphus in the past. Patient was informed that she would need another referral. Patient has sent another referral but would like to see Dr. Dolphus before February. Patient requested for just an x-ray on her knee. Pt stated that Dr. Dolphus knows her and would squeeze her in if I asked.

## 2023-11-16 NOTE — Telephone Encounter (Signed)
 It has been over 3 years since patient has been seen. Please schedule patient for a new patient appointment. Thanks!

## 2023-11-29 ENCOUNTER — Ambulatory Visit (HOSPITAL_BASED_OUTPATIENT_CLINIC_OR_DEPARTMENT_OTHER)
Admission: RE | Admit: 2023-11-29 | Discharge: 2023-11-29 | Disposition: A | Discharge status: Home / Self Care | Source: Ambulatory Visit | Attending: Internal Medicine | Admitting: Internal Medicine

## 2023-11-29 DIAGNOSIS — Z01811 Encounter for preprocedural respiratory examination: Secondary | ICD-10-CM | POA: Diagnosis present

## 2023-11-29 DIAGNOSIS — J8489 Other specified interstitial pulmonary diseases: Secondary | ICD-10-CM | POA: Insufficient documentation

## 2023-11-29 DIAGNOSIS — M359 Systemic involvement of connective tissue, unspecified: Secondary | ICD-10-CM | POA: Diagnosis present

## 2023-11-29 DIAGNOSIS — J849 Interstitial pulmonary disease, unspecified: Secondary | ICD-10-CM | POA: Insufficient documentation

## 2023-11-29 DIAGNOSIS — M349 Systemic sclerosis, unspecified: Secondary | ICD-10-CM | POA: Insufficient documentation

## 2023-12-02 ENCOUNTER — Ambulatory Visit: Payer: Self-pay | Admitting: Internal Medicine

## 2023-12-02 NOTE — Progress Notes (Signed)
 Very slight worsening of the interstitial findings in the lung compared to almost 3 years ago.  Do keep the appointments coming up for the breathing test and nurse practitioner visit

## 2023-12-03 ENCOUNTER — Encounter: Payer: Self-pay | Admitting: Internal Medicine

## 2023-12-03 ENCOUNTER — Ambulatory Visit: Admitting: Internal Medicine

## 2023-12-03 VITALS — BP 142/78 | HR 78 | Temp 98.5°F | Resp 16 | Ht 66.0 in | Wt 188.0 lb

## 2023-12-03 DIAGNOSIS — I10 Essential (primary) hypertension: Secondary | ICD-10-CM | POA: Diagnosis not present

## 2023-12-03 DIAGNOSIS — R635 Abnormal weight gain: Secondary | ICD-10-CM | POA: Insufficient documentation

## 2023-12-03 DIAGNOSIS — E781 Pure hyperglyceridemia: Secondary | ICD-10-CM

## 2023-12-03 DIAGNOSIS — J849 Interstitial pulmonary disease, unspecified: Secondary | ICD-10-CM

## 2023-12-03 DIAGNOSIS — N1832 Chronic kidney disease, stage 3b: Secondary | ICD-10-CM

## 2023-12-03 DIAGNOSIS — E785 Hyperlipidemia, unspecified: Secondary | ICD-10-CM | POA: Diagnosis not present

## 2023-12-03 NOTE — Patient Instructions (Signed)
 Hypertension, Adult High blood pressure (hypertension) is when the force of blood pumping through the arteries is too strong. The arteries are the blood vessels that carry blood from the heart throughout the body. Hypertension forces the heart to work harder to pump blood and may cause arteries to become narrow or stiff. Untreated or uncontrolled hypertension can lead to a heart attack, heart failure, a stroke, kidney disease, and other problems. A blood pressure reading consists of a higher number over a lower number. Ideally, your blood pressure should be below 120/80. The first ("top") number is called the systolic pressure. It is a measure of the pressure in your arteries as your heart beats. The second ("bottom") number is called the diastolic pressure. It is a measure of the pressure in your arteries as the heart relaxes. What are the causes? The exact cause of this condition is not known. There are some conditions that result in high blood pressure. What increases the risk? Certain factors may make you more likely to develop high blood pressure. Some of these risk factors are under your control, including: Smoking. Not getting enough exercise or physical activity. Being overweight. Having too much fat, sugar, calories, or salt (sodium) in your diet. Drinking too much alcohol. Other risk factors include: Having a personal history of heart disease, diabetes, high cholesterol, or kidney disease. Stress. Having a family history of high blood pressure and high cholesterol. Having obstructive sleep apnea. Age. The risk increases with age. What are the signs or symptoms? High blood pressure may not cause symptoms. Very high blood pressure (hypertensive crisis) may cause: Headache. Fast or irregular heartbeats (palpitations). Shortness of breath. Nosebleed. Nausea and vomiting. Vision changes. Severe chest pain, dizziness, and seizures. How is this diagnosed? This condition is diagnosed by  measuring your blood pressure while you are seated, with your arm resting on a flat surface, your legs uncrossed, and your feet flat on the floor. The cuff of the blood pressure monitor will be placed directly against the skin of your upper arm at the level of your heart. Blood pressure should be measured at least twice using the same arm. Certain conditions can cause a difference in blood pressure between your right and left arms. If you have a high blood pressure reading during one visit or you have normal blood pressure with other risk factors, you may be asked to: Return on a different day to have your blood pressure checked again. Monitor your blood pressure at home for 1 week or longer. If you are diagnosed with hypertension, you may have other blood or imaging tests to help your health care provider understand your overall risk for other conditions. How is this treated? This condition is treated by making healthy lifestyle changes, such as eating healthy foods, exercising more, and reducing your alcohol intake. You may be referred for counseling on a healthy diet and physical activity. Your health care provider may prescribe medicine if lifestyle changes are not enough to get your blood pressure under control and if: Your systolic blood pressure is above 130. Your diastolic blood pressure is above 80. Your personal target blood pressure may vary depending on your medical conditions, your age, and other factors. Follow these instructions at home: Eating and drinking  Eat a diet that is high in fiber and potassium, and low in sodium, added sugar, and fat. An example of this eating plan is called the DASH diet. DASH stands for Dietary Approaches to Stop Hypertension. To eat this way: Eat  plenty of fresh fruits and vegetables. Try to fill one half of your plate at each meal with fruits and vegetables. Eat whole grains, such as whole-wheat pasta, brown rice, or whole-grain bread. Fill about one  fourth of your plate with whole grains. Eat or drink low-fat dairy products, such as skim milk or low-fat yogurt. Avoid fatty cuts of meat, processed or cured meats, and poultry with skin. Fill about one fourth of your plate with lean proteins, such as fish, chicken without skin, beans, eggs, or tofu. Avoid pre-made and processed foods. These tend to be higher in sodium, added sugar, and fat. Reduce your daily sodium intake. Many people with hypertension should eat less than 1,500 mg of sodium a day. Do not drink alcohol if: Your health care provider tells you not to drink. You are pregnant, may be pregnant, or are planning to become pregnant. If you drink alcohol: Limit how much you have to: 0-1 drink a day for women. 0-2 drinks a day for men. Know how much alcohol is in your drink. In the [place] one drink equals one 12 oz bottle of beer (355 mL), one 5 oz glass of wine (148 mL), or one 1 oz glass of hard liquor (44 mL). Lifestyle  Work with your health care provider to maintain a healthy body weight or to lose weight. Ask what an ideal weight is for you. Get at least 30 minutes of exercise that causes your heart to beat faster (aerobic exercise) most days of the week. Activities may include walking, swimming, or biking. Include exercise to strengthen your muscles (resistance exercise), such as Pilates or lifting weights, as part of your weekly exercise routine. Try to do these types of exercises for 30 minutes at least 3 days a week. Do not use any products that contain nicotine or tobacco. These products include cigarettes, chewing tobacco, and vaping devices, such as e-cigarettes. If you need help quitting, ask your health care provider. Monitor your blood pressure at home as told by your health care provider. Keep all follow-up visits. This is important. Medicines Take over-the-counter and prescription medicines only as told by your health care provider. Follow directions carefully. Blood  pressure medicines must be taken as prescribed. Do not skip doses of blood pressure medicine. Doing this puts you at risk for problems and can make the medicine less effective. Ask your health care provider about side effects or reactions to medicines that you should watch for. Contact a health care provider if you: Think you are having a reaction to a medicine you are taking. Have headaches that keep coming back (recurring). Feel dizzy. Have swelling in your ankles. Have trouble with your vision. Get help right away if you: Develop a severe headache or confusion. Have unusual weakness or numbness. Feel faint. Have severe pain in your chest or abdomen. Vomit repeatedly. Have trouble breathing. These symptoms may be an emergency. Get help right away. Call 911. Do not wait to see if the symptoms will go away. Do not drive yourself to the hospital. Summary Hypertension is when the force of blood pumping through your arteries is too strong. If this condition is not controlled, it may put you at risk for serious complications. Your personal target blood pressure may vary depending on your medical conditions, your age, and other factors. For most people, a normal blood pressure is less than 120/80. Hypertension is treated with lifestyle changes, medicines, or a combination of both. Lifestyle changes include losing weight, eating a healthy,  low-sodium diet, exercising more, and limiting alcohol. This information is not intended to replace advice given to you by your health care provider. Make sure you discuss any questions you have with your health care provider. Document Revised: 12/11/2020 Document Reviewed: 12/11/2020 Elsevier Patient Education  2024 [employer].

## 2023-12-03 NOTE — Progress Notes (Signed)
 "  Subjective:  Patient ID: Savannah [name], female    DOB: 1945-12-08  Age: 78 y.o. MRN: [id number]  CC: Hypertension and Hyperlipidemia   HPI Savannah [name] presents for f/up --  Discussed the use of AI scribe software for clinical note transcription with the patient, who gave verbal consent to proceed.  History of Present Illness [name] is a 78 year old female with knee pain and interstitial lung disease who presents for evaluation of persistent knee pain and respiratory symptoms.  She has been experiencing persistent knee pain, primarily in the left knee, since July. The pain is described as severe, with both knees being 'bone on bone.' A cortisone injection on October 6th did not alleviate the pain. She takes Tylenol and Advil for pain relief, which provides minimal relief. She has not had knee surgery, but x-rays have shown significant degeneration, and surgery has been discussed as a potential option. She had hip surgery in February.  She has a history of interstitial lung disease and recently underwent a CT scan, which showed some worsening with 'a little thickening.' She experiences difficulty breathing during physical activity, describing it as a struggle to take in more air. She attributes some of her cough to medications, including metoprolol  and olmesartan . She was also on exemestane for hormone therapy but discontinued it due to side effects.  Her blood pressure has been stable, though she occasionally experiences blurred vision. She uses eye drops and has been informed of cataracts that are not yet ready for surgery. She received a flu vaccine in September.  She mentions having had extensive blood work done in February in preparation for hip surgery. She has not had recent checks on kidney function or electrolytes since her previous doctor retired.  No chest pain, dizziness, lightheadedness, or palpitations.     Outpatient Medications Prior to Visit  Medication Sig Dispense  Refill   aspirin 81 MG tablet Take 81 mg by mouth.     b complex vitamins capsule Take 1 capsule by mouth 2 (two) times a week.     Calcium  Carbonate (CALCIUM  600 PO) Take 600 mg by mouth daily.     Cholecalciferol (D3 MAXIMUM STRENGTH PO) Take 5,000 Units by mouth 3 (three) times a week.     Coenzyme Q10 200 MG capsule Take 200 mg by mouth daily.     diclofenac (VOLTAREN) 0.1 % ophthalmic solution Place 1 drop into the left eye 2 (two) times daily.     exemestane (AROMASIN) 25 MG tablet Take 25 mg by mouth daily after breakfast.     ezetimibe  (ZETIA ) 10 MG tablet Take 10 mg by mouth daily.     furosemide  (LASIX ) 20 MG tablet TAKE 20 MG( 1/2 TABLET ) TO 40 MG ( 1 TABLET) AS NEEDED FOR FLUID OR EDEMA 180 tablet 1   loratadine (CLARITIN) 10 MG tablet Take 10 mg by mouth daily as needed for allergies.     Magnesium 300 MG CAPS Take 300 mg by mouth daily.     MAGNESIUM CL-CALCIUM  CARBONATE PO Take 400 mg by mouth daily.     Menaquinone-7 (VITAMIN K2 PO) Take 50 mcg by mouth 3 (three) times a week.     metoprolol  succinate (TOPROL -XL) 50 MG 24 hr tablet TAKE 1 TABLET BY MOUTH EVERY DAY WITH OR IMMEDIATELY FOLLOWING A MEAL 90 tablet 3   olmesartan -hydrochlorothiazide (BENICAR  HCT) 40-12.5 MG tablet Take 1 tablet by mouth daily. 90 tablet 2   Pitavastatin  Calcium  2  MG TABS Take 1 tablet (2 mg total) by mouth daily. 90 tablet 3   potassium chloride  SA (KLOR-CON  M20) 20 MEQ tablet Take 1 tablet (20 mEq total) by mouth daily as needed. When you take Furosemide  (Lasix ). 90 tablet 3   valACYclovir  (VALTREX ) 1000 MG tablet Take 1 tablet (1,000 mg total) by mouth 2 (two) times daily. 180 tablet 1   VASCEPA  1 g capsule Take 1 capsule (1 g total) by mouth every morning. 90 capsule 3   vitamin E 180 MG (400 UNITS) capsule Take 400 Units by mouth daily.     No facility-administered medications prior to visit.    ROS Review of Systems  Constitutional:  Negative for appetite change, chills, diaphoresis,  fatigue and fever.  HENT: Negative.    Eyes: Negative.   Respiratory:  Negative for cough, chest tightness, shortness of breath and wheezing.   Cardiovascular:  Negative for chest pain, palpitations and leg swelling.  Gastrointestinal:  Negative for abdominal pain, blood in stool, constipation, diarrhea, nausea and vomiting.  Endocrine: Negative.   Genitourinary: Negative.  Negative for difficulty urinating, dysuria, flank pain and hematuria.  Musculoskeletal:  Positive for arthralgias and gait problem. Negative for joint swelling and myalgias.  Skin: Negative.  Negative for color change and pallor.  Neurological:  Negative for dizziness, weakness, light-headedness and numbness.  Hematological:  Negative for adenopathy. Does not bruise/bleed easily.  Psychiatric/Behavioral: Negative.      Objective:  BP (!) 142/78 (BP Location: Left Arm, Patient Position: Sitting, Cuff Size: Normal)   Pulse 78   Temp 98.5 F (36.9 C) (Oral)   Resp 16   Ht 5' 6 (1.676 m)   Wt 188 lb (85.3 kg)   SpO2 98%   BMI 30.34 kg/m   BP Readings from Last 3 Encounters:  12/03/23 (!) 142/78  08/03/23 114/66  06/08/23 114/72    Wt Readings from Last 3 Encounters:  12/03/23 188 lb (85.3 kg)  08/03/23 181 lb 6.4 oz (82.3 kg)  06/08/23 185 lb 3.2 oz (84 kg)    Physical Exam Vitals reviewed.  Constitutional:      Appearance: Normal appearance.  HENT:     Mouth/Throat:     Mouth: Mucous membranes are moist.  Eyes:     General: No scleral icterus.    Conjunctiva/sclera: Conjunctivae normal.  Cardiovascular:     Rate and Rhythm: Normal rate and regular rhythm.     Heart sounds: No murmur heard.    No friction rub. No gallop.     Comments: EKG-- NSR, 68 bpm No LVH, Q waves, or ST/T wave changes  Pulmonary:     Effort: Pulmonary effort is normal.     Breath sounds: No stridor. No wheezing, rhonchi or rales.  Abdominal:     General: Abdomen is flat.     Palpations: There is no mass.      Tenderness: There is no abdominal tenderness. There is no guarding or rebound.     Hernia: No hernia is present.  Musculoskeletal:        General: Deformity present. Normal range of motion.     Cervical back: Neck supple.     Right lower leg: No edema.     Left lower leg: No edema.  Lymphadenopathy:     Cervical: No cervical adenopathy.  Skin:    General: Skin is warm.  Neurological:     Mental Status: She is alert.     Lab Results  Component Value Date  WBC 6.9 12/04/2023   HGB 13.8 12/04/2023   HCT 39.7 12/04/2023   PLT 271.0 12/04/2023   GLUCOSE 105 (H) 12/04/2023   CHOL 119 12/04/2023   TRIG 118.0 12/04/2023   HDL 43.20 12/04/2023   LDLDIRECT 73.0 01/16/2021   LDLCALC 52 12/04/2023   ALT 22 12/04/2023   AST 15 12/04/2023   NA 141 12/04/2023   K 4.1 12/04/2023   CL 103 12/04/2023   CREATININE 1.38 (H) 12/04/2023   BUN 37 (H) 12/04/2023   CO2 31 12/04/2023   TSH 4.68 12/04/2023   HGBA1C 5.2 11/27/2021    CT Chest High Resolution Result Date: 12/01/2023 CLINICAL DATA:  Interstitial lung disease, history of right lumpectomy * Tracking Code: BO * EXAM: CT CHEST WITHOUT CONTRAST TECHNIQUE: Multidetector CT imaging of the chest was performed following the standard protocol without intravenous contrast. High resolution imaging of the lungs, as well as inspiratory and expiratory imaging, was performed. RADIATION DOSE REDUCTION: This exam was performed according to the departmental dose-optimization program which includes automated exposure control, adjustment of the mA and/or kV according to patient size and/or use of iterative reconstruction technique. COMPARISON:  02/05/2021 FINDINGS: Cardiovascular: Aortic atherosclerosis. Normal heart size. Three-vessel coronary artery calcifications. No pericardial effusion. Mediastinum/Nodes: No enlarged mediastinal, hilar, or axillary lymph nodes. Thyroid  gland, trachea, and esophagus demonstrate no significant findings. Lungs/Pleura:  Very slight interval worsening of bibasilar irregular interstitial opacity, interlobular septal thickening, ground-glass, and scattered varicoid bronchiectasis. No evidence of subpleural bronchiolectasis or honeycombing. No significant air trapping on expiratory phase imaging. No pleural effusion or pneumothorax. Upper Abdomen: No acute abnormality. Musculoskeletal: Right lumpectomy and axillary lymph node dissection (series 2, image 63). No acute osseous findings. IMPRESSION: 1. Very slight interval worsening of bibasilar irregular interstitial opacity, interlobular septal thickening, ground-glass, and scattered varicoid bronchiectasis. No evidence of subpleural bronchiolectasis or honeycombing. Findings remain indeterminate for UIP per consensus guidelines: Diagnosis of Idiopathic Pulmonary Fibrosis: An Official ATS/ERS/JRS/ALAT Clinical Practice Guideline. Am JINNY [employer] Crit Care Med Vol 198, Iss 5, [id number], Oct 18 2016. 2. Right lumpectomy and axillary lymph node dissection. 3. Coronary artery disease. Aortic Atherosclerosis (ICD10-I70.0). Electronically Signed   By: Marolyn JONETTA Jaksch M.D.   On: 12/01/2023 17:02    Assessment & Plan:   ILD (interstitial lung disease) (HCC)- Asx.  Dyslipidemia, goal LDL below 160- LDL goal achieved. Doing well on the statin  -     TSH; Future -     Lipid panel; Future -     Hepatic function panel; Future  Primary hypertension- BP is well controlled. EKG is negative for LVH. -     Basic metabolic panel with GFR; Future -     CBC with Differential/Platelet; Future -     EKG 12-Lead -     Hepatic function panel; Future -     Urinalysis, Routine w reflex microscopic; Future  Pure hyperglyceridemia -     Lipid panel; Future -     Hepatic function panel; Future  Stage 3b chronic kidney disease (HCC) -     US  RENAL; Future     Follow-up: Return in about 6 months (around 06/02/2024).  Debby Molt, MD "

## 2023-12-04 ENCOUNTER — Other Ambulatory Visit

## 2023-12-04 ENCOUNTER — Ambulatory Visit: Payer: Self-pay | Admitting: Internal Medicine

## 2023-12-04 DIAGNOSIS — E785 Hyperlipidemia, unspecified: Secondary | ICD-10-CM | POA: Diagnosis not present

## 2023-12-04 DIAGNOSIS — I10 Essential (primary) hypertension: Secondary | ICD-10-CM | POA: Diagnosis not present

## 2023-12-04 DIAGNOSIS — E781 Pure hyperglyceridemia: Secondary | ICD-10-CM

## 2023-12-04 DIAGNOSIS — N1832 Chronic kidney disease, stage 3b: Secondary | ICD-10-CM | POA: Insufficient documentation

## 2023-12-04 LAB — LIPID PANEL
Cholesterol: 119 mg/dL (ref 0–200)
HDL: 43.2 mg/dL (ref >39.00)
LDL Cholesterol: 52 mg/dL (ref 0–99)
NonHDL: 75.34
Total CHOL/HDL Ratio: 3
Triglycerides: 118 mg/dL (ref 0.0–149.0)
VLDL: 23.6 mg/dL (ref 0.0–40.0)

## 2023-12-04 LAB — BASIC METABOLIC PANEL WITH GFR
BUN: 37 mg/dL — ABNORMAL HIGH (ref 6–23)
CO2: 31 meq/L (ref 19–32)
Calcium: 10.2 mg/dL (ref 8.4–10.5)
Chloride: 103 meq/L (ref 96–112)
Creatinine, Ser: 1.38 mg/dL — ABNORMAL HIGH (ref 0.40–1.20)
GFR: 36.48 mL/min — ABNORMAL LOW (ref >60.00)
Glucose, Bld: 105 mg/dL — ABNORMAL HIGH (ref 70–99)
Potassium: 4.1 meq/L (ref 3.5–5.1)
Sodium: 141 meq/L (ref 135–145)

## 2023-12-04 LAB — CBC WITH DIFFERENTIAL/PLATELET
Basophils Absolute: 0.1 K/uL (ref 0.0–0.1)
Basophils Relative: 1 % (ref 0.0–3.0)
Eosinophils Absolute: 0.2 K/uL (ref 0.0–0.7)
Eosinophils Relative: 2.6 % (ref 0.0–5.0)
HCT: 39.7 % (ref 36.0–46.0)
Hemoglobin: 13.8 g/dL (ref 12.0–15.0)
Lymphocytes Relative: 31.8 % (ref 12.0–46.0)
Lymphs Abs: 2.2 K/uL (ref 0.7–4.0)
MCHC: 34.7 g/dL (ref 30.0–36.0)
MCV: 91.4 fl (ref 78.0–100.0)
Monocytes Absolute: 0.6 K/uL (ref 0.1–1.0)
Monocytes Relative: 8.2 % (ref 3.0–12.0)
Neutro Abs: 3.9 K/uL (ref 1.4–7.7)
Neutrophils Relative %: 56.4 % (ref 43.0–77.0)
Platelets: 271 K/uL (ref 150.0–400.0)
RBC: 4.34 Mil/uL (ref 3.87–5.11)
RDW: 12.4 % (ref 11.5–15.5)
WBC: 6.9 K/uL (ref 4.0–10.5)

## 2023-12-04 LAB — URINALYSIS, ROUTINE W REFLEX MICROSCOPIC
Bilirubin Urine: NEGATIVE
Hgb urine dipstick: NEGATIVE
Ketones, ur: NEGATIVE
Leukocytes,Ua: NEGATIVE
Nitrite: NEGATIVE
RBC / HPF: NONE SEEN (ref 0–?)
Specific Gravity, Urine: 1.015 (ref 1.000–1.030)
Total Protein, Urine: NEGATIVE
Urine Glucose: NEGATIVE
Urobilinogen, UA: 0.2 (ref 0.0–1.0)
pH: 6.5 (ref 5.0–8.0)

## 2023-12-04 LAB — HEPATIC FUNCTION PANEL
ALT: 22 U/L (ref 0–35)
AST: 15 U/L (ref 0–37)
Albumin: 4.5 g/dL (ref 3.5–5.2)
Alkaline Phosphatase: 50 U/L (ref 39–117)
Bilirubin, Direct: 0.2 mg/dL (ref 0.0–0.3)
Total Bilirubin: 0.8 mg/dL (ref 0.2–1.2)
Total Protein: 6.9 g/dL (ref 6.0–8.3)

## 2023-12-04 LAB — TSH: TSH: 4.68 u[IU]/mL (ref 0.35–5.50)

## 2023-12-06 ENCOUNTER — Encounter: Payer: Self-pay | Admitting: Internal Medicine

## 2023-12-10 ENCOUNTER — Ambulatory Visit
Admission: RE | Admit: 2023-12-10 | Discharge: 2023-12-10 | Disposition: A | Discharge status: Home / Self Care | Source: Ambulatory Visit | Attending: Internal Medicine | Admitting: Internal Medicine

## 2023-12-10 DIAGNOSIS — N1832 Chronic kidney disease, stage 3b: Secondary | ICD-10-CM

## 2023-12-18 ENCOUNTER — Ambulatory Visit (INDEPENDENT_AMBULATORY_CARE_PROVIDER_SITE_OTHER)

## 2023-12-18 DIAGNOSIS — Z01811 Encounter for preprocedural respiratory examination: Secondary | ICD-10-CM

## 2023-12-18 DIAGNOSIS — J849 Interstitial pulmonary disease, unspecified: Secondary | ICD-10-CM

## 2023-12-18 DIAGNOSIS — J8489 Other specified interstitial pulmonary diseases: Secondary | ICD-10-CM

## 2023-12-18 DIAGNOSIS — M349 Systemic sclerosis, unspecified: Secondary | ICD-10-CM

## 2023-12-18 LAB — PULMONARY FUNCTION TEST
DL/VA % pred: 72 %
DL/VA: 2.94 ml/min/mmHg/L
DLCO cor % pred: 64 %
DLCO cor: 12.71 ml/min/mmHg
DLCO unc % pred: 64 %
DLCO unc: 12.86 ml/min/mmHg
FEF 25-75 Pre: 1.5 L/s
FEF2575-%Pred-Pre: 96 %
FEV1-%Pred-Pre: 105 %
FEV1-Pre: 2.23 L
FEV1FVC-%Pred-Pre: 98 %
FEV6-%Pred-Pre: 111 %
FEV6-Pre: 3.01 L
FEV6FVC-%Pred-Pre: 103 %
FVC-%Pred-Pre: 107 %
FVC-Pre: 3.06 L
Pre FEV1/FVC ratio: 73 %
Pre FEV6/FVC Ratio: 98 %

## 2023-12-18 NOTE — Progress Notes (Signed)
Spiro/DLCO performed today. 

## 2023-12-18 NOTE — Patient Instructions (Signed)
Spiro/DLCO performed today. 

## 2024-01-05 ENCOUNTER — Ambulatory Visit: Admitting: Primary Care

## 2024-01-05 DIAGNOSIS — J849 Interstitial pulmonary disease, unspecified: Secondary | ICD-10-CM

## 2024-01-29 ENCOUNTER — Other Ambulatory Visit (HOSPITAL_COMMUNITY): Payer: Self-pay

## 2024-01-29 ENCOUNTER — Telehealth: Payer: Self-pay | Admitting: Pharmacy Technician

## 2024-01-29 NOTE — Telephone Encounter (Signed)
 Pharmacy Patient Advocate Encounter   Received notification from Fax that prior authorization for Vascepa  BRAND  is required/requested.   Insurance verification completed.   The patient is insured through [employer].   Per test claim: PA required; PA submitted to above mentioned insurance via Latent Key/confirmation #/EOC [id number] Status is pending

## 2024-01-29 NOTE — Telephone Encounter (Signed)
 Pharmacy Patient Advocate Encounter  Received notification from [employer] that Prior Authorization for Vascepa  has been APPROVED from 12/30/23 to 01/28/25   PA #/Case ID/Reference #: [id number]

## 2024-02-03 ENCOUNTER — Ambulatory Visit: Admitting: Adult Health

## 2024-02-03 ENCOUNTER — Encounter: Payer: Self-pay | Admitting: Adult Health

## 2024-02-03 VITALS — BP 128/64 | HR 91 | Temp 97.6°F | Ht 66.0 in | Wt 189.4 lb

## 2024-02-03 DIAGNOSIS — J849 Interstitial pulmonary disease, unspecified: Secondary | ICD-10-CM | POA: Diagnosis not present

## 2024-02-03 DIAGNOSIS — C50919 Malignant neoplasm of unspecified site of unspecified female breast: Secondary | ICD-10-CM | POA: Diagnosis not present

## 2024-02-03 DIAGNOSIS — M349 Systemic sclerosis, unspecified: Secondary | ICD-10-CM | POA: Diagnosis not present

## 2024-02-03 NOTE — Progress Notes (Unsigned)
 @Patient  ID: Savannah [name], female    DOB: 1945-10-07, 78 y.o.   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 Medical history significant  and breast cancer who presents with concerns about medication side effects and interstitial lung disease.  She is concerned about her current breast cancer medication and its potential impact on her interstitial lung disease. She was diagnosed with breast cancer three years ago and has been on hormone therapy since. She recently started a new medication -Letrozole in November after experiencing side effects from previous treatments. She is worried about the medication and potential side effects of pneumonitis   She has a history of scleroderma, primarily manifesting as skin tightening and joint pain. She has tried various treatments, including methotrexate, prednisone  and other immunosuppressants, without success. She is not currently on any medication for scleroderma. She experiences joint pain and fatigue.  Has not seen Rheumatology in >2 years.   She experiences shortness of breath, particularly when walking up steps or inclines, and a cough, which her daughter has noticed. She attributes the cough to possible allergies or drainage. She has not used inhalers due to side effects like jitters. Pulmonary function testing December 18, 2023  showed a slight decline with FEV1 at 105%, ratio 73, FVC 107%, DLCO 64%.  Previous DLCO was 78%. High-resolution CT chest November 29, 2023 showed very slight interval worsening of the bibasilar irregular interstitial opacity and ground glass. She has not seen her rheumatologist in about three years and is considering a follow-up. She has not smoked and has received her flu shot for the season.     Allergies[1]  Immunization History  Administered Date(s) Administered   Fluad Quad(high Dose 65+) 01/24/2019   INFLUENZA, HIGH DOSE SEASONAL PF 10/17/2019, 12/18/2020   Influenza-Unspecified 11/07/2023   PFIZER(Purple Top)SARS-COV-2 Vaccination 03/13/2019, 04/05/2019, 01/24/2020   Pneumococcal Conjugate-13 11/20/2014   Pneumococcal Polysaccharide-23 11/17/2013, 01/16/2021   Tdap 07/29/2016   Zoster, Live 12/09/2009    Past Medical History:  Diagnosis Date   Dyspnea    MYOVIEW, 12/29/2006 - post-stress EF 81%,no ECG changes, EKG negative for ischemia   Hyperlipidemia    Hypertension    2D ECHO, 12/24/2006 - EF 45-55%, normal   Osteoarthritis    Swelling of extremity    LEA VENOUS DUPLEX, 08/19/2011 - deep valvular insufficiency noted with right femoral and popliteal veins and left common femoral vein   TIA (transient ischemic attack)    CAROTID DOPPLER, 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    Tobacco History: Tobacco Use History[2] Counseling given: Not Answered   Outpatient Medications Prior to Visit  Medication Sig Dispense Refill   aspirin 81 MG tablet Take 81 mg by mouth.     b complex vitamins capsule Take 1 capsule by mouth 2 (two) times a week. (Patient taking differently: Take 1 capsule by mouth 3 (three) times a  week.)     Calcium  Carbonate (CALCIUM  600 PO) Take 600 mg by mouth daily.     Cholecalciferol (D3 MAXIMUM STRENGTH PO) Take 5,000 Units by mouth 3 (three) times a week.     Coenzyme Q10 200 MG capsule Take 200 mg by mouth daily.      diclofenac (VOLTAREN) 0.1 % ophthalmic solution Place 1 drop into the left eye 2 (two) times daily.     ezetimibe  (ZETIA ) 10 MG tablet Take 10 mg by mouth daily.     furosemide  (LASIX ) 20 MG tablet TAKE 20 MG( 1/2 TABLET ) TO 40 MG ( 1 TABLET) AS NEEDED FOR FLUID OR EDEMA 180 tablet 1   letrozole (FEMARA) 2.5 MG tablet Take 2.5 mg by mouth daily.     loratadine (CLARITIN) 10 MG tablet Take 10 mg by mouth daily as needed for allergies.     Magnesium 300 MG CAPS Take 300 mg by mouth daily.     Menaquinone-7 (VITAMIN K2 PO) Take 50 mcg by mouth 3 (three) times a week.     metoprolol  succinate (TOPROL -XL) 50 MG 24 hr tablet TAKE 1 TABLET BY MOUTH EVERY DAY WITH OR IMMEDIATELY FOLLOWING A MEAL 90 tablet 3   naproxen sodium (ALEVE) 220 MG tablet Take 220 mg by mouth 2 (two) times daily as needed.     olmesartan -hydrochlorothiazide (BENICAR  HCT) 40-12.5 MG tablet Take 1 tablet by mouth daily. 90 tablet 2   Pitavastatin  Calcium  2 MG TABS Take 1 tablet (2 mg total) by mouth daily. 90 tablet 3   potassium chloride  SA (KLOR-CON  M20) 20 MEQ tablet Take 1 tablet (20 mEq total) by mouth daily as needed. When you take Furosemide  (Lasix ). 90 tablet 3   valACYclovir  (VALTREX ) 1000 MG tablet Take 1 tablet (1,000 mg total) by mouth 2 (two) times daily. 180 tablet 1   VASCEPA  1 g capsule Take 1 capsule (1 g total) by mouth every morning. 90 capsule 3   vitamin E 180 MG (400 UNITS) capsule Take 400 Units by mouth daily.     MAGNESIUM CL-CALCIUM  CARBONATE PO Take 400 mg by mouth daily. (Patient not taking: Reported on 02/03/2024)     Olmesartan -amLODIPine-HCTZ 40-10-12.5 MG TABS Take 1 tablet by mouth daily. (Patient not taking: Reported on 02/03/2024)     exemestane (AROMASIN) 25 MG tablet Take 25 mg by mouth daily after breakfast.     No facility-administered medications prior to visit.     Review of Systems:   Constitutional:   No  weight loss, night sweats,  Fevers, chills, +fatigue, or  lassitude.  HEENT:    No headaches,  Difficulty swallowing,  Tooth/dental problems, or  Sore throat,                No sneezing, itching, ear ache, nasal congestion, post nasal drip,   CV:  No chest pain,  Orthopnea, PND, swelling in lower extremities, anasarca, dizziness, palpitations, syncope.   GI  No heartburn, indigestion, abdominal pain, nausea, vomiting, diarrhea, change in bowel habits, loss of appetite, bloody stools.   Resp: No chest wall deformity  Skin: no rash or lesions.  GU: no dysuria, change in color of urine, no urgency or frequency.  No flank pain, no hematuria   MS:  +joint pain     Physical Exam  BP 128/64   Pulse 91   Temp 97.6 F (36.4 C)   Ht 5' 6 (1.676 m) Comment: pER PT  Wt 189 lb 6.4 oz (85.9 kg)  SpO2 99% Comment: RA  BMI 30.57 kg/m   GEN: A/Ox3; pleasant , NAD, well nourished    HEENT:  [place]/AT,  NOSE-clear, THROAT-clear, no lesions, no postnasal drip or exudate noted.   NECK:  Supple w/ fair ROM; no JVD; normal carotid impulses w/o bruits; no thyromegaly or nodules palpated; no lymphadenopathy.    RESP  Clear  P & A; w/o, wheezes/ rales/ or rhonchi. no accessory muscle use, no dullness to percussion  CARD:  RRR, no m/r/g, no peripheral edema, pulses intact, no cyanosis or clubbing.  GI:   Soft & nt; nml bowel sounds; no organomegaly or masses detected.   Musco: Warm bil, no deformities or joint swelling noted.   Neuro: alert, no focal deficits noted.    Skin: Warm, no lesions or rashes    Lab Results:Reviewed 02/03/2024   CBC    Component Value Date/Time   WBC 6.9 12/04/2023 0849   RBC 4.34 12/04/2023 0849   HGB 13.8 12/04/2023 0849   HGB 14.9 07/30/2022 1040   HCT 39.7 12/04/2023 0849   HCT 42.6 07/30/2022 1040   PLT 271.0 12/04/2023 0849   PLT 274 07/30/2022 1040   MCV 91.4 12/04/2023 0849   MCV 91 07/30/2022 1040   MCH 31.9 07/30/2022 1040   MCH 30.8 11/24/2017 1341   MCHC 34.7 12/04/2023 0849   RDW 12.4 12/04/2023 0849   RDW 12.9  07/30/2022 1040   LYMPHSABS 2.2 12/04/2023 0849   LYMPHSABS 1.9 07/30/2022 1040   MONOABS 0.6 12/04/2023 0849   EOSABS 0.2 12/04/2023 0849   EOSABS 0.1 07/30/2022 1040   BASOSABS 0.1 12/04/2023 0849   BASOSABS 0.1 07/30/2022 1040    BMET    Component Value Date/Time   NA 141 12/04/2023 0849   NA 145 (H) 06/10/2023 1123   K 4.1 12/04/2023 0849   CL 103 12/04/2023 0849   CO2 31 12/04/2023 0849   GLUCOSE 105 (H) 12/04/2023 0849   BUN 37 (H) 12/04/2023 0849   BUN 18 06/10/2023 1123   CREATININE 1.38 (H) 12/04/2023 0849   CREATININE 0.91 12/27/2018 1355   CALCIUM  10.2 12/04/2023 0849   GFRNONAA 63 10/16/2020 0000   GFRNONAA 62 12/27/2018 1355   GFRAA 86 01/09/2020 1025   GFRAA 72 12/27/2018 1355    BNP No results found for: BNP  ProBNP No results found for: PROBNP  Imaging: No results found.  Administration History     None          Latest Ref Rng & Units 12/18/2023   12:38 PM 01/27/2023    2:05 PM 01/23/2021    2:55 PM 01/17/2020    2:37 PM 12/29/2018    2:41 PM 12/16/2017    9:05 AM 12/17/2016   10:00 AM  PFT Results  FVC-Pre L 3.06  2.92  3.07  3.02  3.15  3.21  3.21   FVC-Predicted Pre % 107  101  100  97  100  98  97   FVC-Post L     3.15     FVC-Predicted Post %     100     Pre FEV1/FVC % % 73  77  76  75  76  77  79   Post FEV1/FCV % %     80     FEV1-Pre L 2.23  2.25  2.34  2.27  2.38  2.48  2.53   FEV1-Predicted Pre % 105  104  101  97  100  100  101  FEV1-Post L     2.52     DLCO uncorrected ml/min/mmHg 12.86  15.62  15.68  15.75  16.54  18.01  16.59   DLCO UNC% % 64  78  76  76  79  63  58   DLCO corrected ml/min/mmHg 12.71  15.62  15.68  15.52    17.02   DLCO COR %Predicted % 64  78  76  75    60   DLVA Predicted % 72  88  79  80  82  72  66   TLC L     5.05     TLC % Predicted %     93     RV % Predicted %     79       No results found for: NITRICOXIDE      No data to display              Assessment & Plan:    Assessment and Plan Assessment & Plan 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 Diffusing capacity decreased from 78% to 64%. Symptoms include mild dyspnea on exertion and morning cough, possibly due to allergies or drainage. No current treatment for scleroderma, as previous treatments, including methotrexate, were not well tolerated. Concerns exist about potential exacerbation of lung disease with current breast cancer hormone therapy (letrozole), but the benefits of preventing breast cancer recurrence outweigh the risks.  Continue letrozole for breast cancer prevention. Monitor lung function closely. Referred to rheumatology for evaluation of newer treatments for scleroderma. Schedule follow-up in four months with repeat lung function tests. Coordinated care with oncology team regarding letrozole therapy with close monitoring.   History of Breast cancer -stable  Continue follow-up with oncology.  Continue close monitoring while on letrozole.      Plan  Patient Instructions  Continue activity as tolerated.  Reestablish with Rheumatology-referral sent.  Follow up with Dr. Geronimo in 4 months with Spirometry with DLCO        Madelin Stank, NP 02/03/2024     [1]  Allergies Allergen Reactions   Celecoxib Hives   Codeine Nausea Only    Other reaction(s): Unknown   Cymbalta [Duloxetine Hcl] Hives   Exemestane     Patient states that she had all the side effects    Lyrica [Pregabalin] Hives   Morphine  Nausea And Vomiting   Penicillins    Sulfa Antibiotics Hives and Other (See Comments)   Zocor [Simvastatin] Other (See Comments)    Myalgia  [2]  Social History Tobacco Use  Smoking Status Never   Passive exposure: Never  Smokeless Tobacco Never

## 2024-02-03 NOTE — Patient Instructions (Signed)
 Continue activity as tolerated.  Reestablish with Rheumatology-referral sent.  Follow up with Dr. Geronimo in 4 months with Spirometry with DLCO

## 2024-02-08 ENCOUNTER — Other Ambulatory Visit: Payer: Self-pay

## 2024-02-09 MED ORDER — VASCEPA 1 G PO CAPS: 1.0000 g | ORAL_CAPSULE | Freq: Every morning | ORAL | 2 refills | Status: AC

## 2024-02-16 ENCOUNTER — Other Ambulatory Visit (HOSPITAL_COMMUNITY): Payer: Self-pay

## 2024-03-23 ENCOUNTER — Other Ambulatory Visit: Payer: Self-pay | Admitting: General Practice

## 2024-03-25 MED ORDER — PITAVASTATIN CALCIUM 2 MG PO TABS: 2.0000 mg | ORAL_TABLET | Freq: Every day | ORAL | 1 refills | Status: AC

## 2024-05-19 ENCOUNTER — Ambulatory Visit: Admitting: Rheumatology

## 2024-05-20 ENCOUNTER — Ambulatory Visit

## 2024-06-14 ENCOUNTER — Encounter

## 2024-06-14 ENCOUNTER — Ambulatory Visit: Admitting: Internal Medicine

## 2024-06-21 ENCOUNTER — Ambulatory Visit: Admitting: Rheumatology

## 2024-08-01 ENCOUNTER — Ambulatory Visit: Admitting: Cardiovascular Disease
